# Patient Record
Sex: Female | Born: 1945 | Race: White | Hispanic: No | Smoking: Former smoker
Health system: Southern US, Community
[De-identification: ages and names within clinical notes are randomized; demographics above are authoritative.]

## PROBLEM LIST (undated history)

## (undated) DIAGNOSIS — J189 Pneumonia, unspecified organism: Secondary | ICD-10-CM

## (undated) DIAGNOSIS — K219 Gastro-esophageal reflux disease without esophagitis: Secondary | ICD-10-CM

## (undated) DIAGNOSIS — C642 Malignant neoplasm of left kidney, except renal pelvis: Secondary | ICD-10-CM

## (undated) DIAGNOSIS — R7303 Prediabetes: Secondary | ICD-10-CM

## (undated) DIAGNOSIS — E669 Obesity, unspecified: Secondary | ICD-10-CM

## (undated) DIAGNOSIS — E041 Nontoxic single thyroid nodule: Secondary | ICD-10-CM

## (undated) DIAGNOSIS — S42302A Unspecified fracture of shaft of humerus, left arm, initial encounter for closed fracture: Secondary | ICD-10-CM

## (undated) DIAGNOSIS — J449 Chronic obstructive pulmonary disease, unspecified: Secondary | ICD-10-CM

## (undated) DIAGNOSIS — M1990 Unspecified osteoarthritis, unspecified site: Secondary | ICD-10-CM

## (undated) DIAGNOSIS — E785 Hyperlipidemia, unspecified: Secondary | ICD-10-CM

## (undated) DIAGNOSIS — I10 Essential (primary) hypertension: Secondary | ICD-10-CM

## (undated) HISTORY — PX: BREAST BIOPSY: SHX20

## (undated) HISTORY — PX: PARTIAL NEPHRECTOMY: SHX414

## (undated) HISTORY — DX: Chronic obstructive pulmonary disease, unspecified: J44.9

## (undated) HISTORY — DX: Nontoxic single thyroid nodule: E04.1

## (undated) HISTORY — DX: Gastro-esophageal reflux disease without esophagitis: K21.9

## (undated) HISTORY — PX: UTERINE FIBROID SURGERY: SHX826

## (undated) HISTORY — DX: Unspecified osteoarthritis, unspecified site: M19.90

## (undated) HISTORY — PX: CHOLECYSTECTOMY: SHX55

## (undated) HISTORY — DX: Essential (primary) hypertension: I10

## (undated) HISTORY — DX: Unspecified fracture of shaft of humerus, left arm, initial encounter for closed fracture: S42.302A

## (undated) HISTORY — DX: Hyperlipidemia, unspecified: E78.5

## (undated) HISTORY — PX: GANGLION CYST EXCISION: SHX1691

## (undated) HISTORY — PX: BREAST SURGERY: SHX581

## (undated) HISTORY — PX: CATARACT EXTRACTION: SUR2

---

## 1993-04-06 HISTORY — PX: BILATERAL SALPINGOOPHORECTOMY: SHX1223

## 1993-04-06 HISTORY — PX: ABDOMINAL HYSTERECTOMY: SHX81

## 1993-04-06 HISTORY — PX: TOTAL ABDOMINAL HYSTERECTOMY W/ BILATERAL SALPINGOOPHORECTOMY: SHX83

## 1998-02-13 ENCOUNTER — Inpatient Hospital Stay (HOSPITAL_COMMUNITY): Admission: RE | Admit: 1998-02-13 | Discharge: 1998-02-15 | Payer: Self-pay | Admitting: Gynecology

## 1999-01-10 ENCOUNTER — Other Ambulatory Visit: Admission: RE | Admit: 1999-01-10 | Discharge: 1999-01-10 | Payer: Self-pay | Admitting: Gynecology

## 2000-03-15 ENCOUNTER — Other Ambulatory Visit: Admission: RE | Admit: 2000-03-15 | Discharge: 2000-03-15 | Payer: Self-pay | Admitting: Gynecology

## 2001-04-13 ENCOUNTER — Other Ambulatory Visit: Admission: RE | Admit: 2001-04-13 | Discharge: 2001-04-13 | Payer: Self-pay | Admitting: Gynecology

## 2002-04-27 ENCOUNTER — Other Ambulatory Visit: Admission: RE | Admit: 2002-04-27 | Discharge: 2002-04-27 | Payer: Self-pay | Admitting: Gynecology

## 2003-06-07 ENCOUNTER — Other Ambulatory Visit: Admission: RE | Admit: 2003-06-07 | Discharge: 2003-06-07 | Payer: Self-pay | Admitting: Gynecology

## 2003-06-17 ENCOUNTER — Encounter: Admission: RE | Admit: 2003-06-17 | Discharge: 2003-06-17 | Payer: Self-pay | Admitting: Internal Medicine

## 2004-09-16 ENCOUNTER — Other Ambulatory Visit: Admission: RE | Admit: 2004-09-16 | Discharge: 2004-09-16 | Payer: Self-pay | Admitting: Gynecology

## 2005-03-03 ENCOUNTER — Ambulatory Visit: Payer: Self-pay | Admitting: Internal Medicine

## 2005-03-13 ENCOUNTER — Ambulatory Visit: Payer: Self-pay | Admitting: Internal Medicine

## 2005-04-06 HISTORY — PX: COLONOSCOPY: SHX174

## 2005-06-02 ENCOUNTER — Ambulatory Visit: Payer: Self-pay | Admitting: Internal Medicine

## 2005-12-17 ENCOUNTER — Ambulatory Visit: Payer: Self-pay | Admitting: Family Medicine

## 2005-12-30 ENCOUNTER — Ambulatory Visit: Payer: Self-pay | Admitting: Family Medicine

## 2006-01-27 ENCOUNTER — Ambulatory Visit: Payer: Self-pay | Admitting: Gastroenterology

## 2006-04-06 DIAGNOSIS — I10 Essential (primary) hypertension: Secondary | ICD-10-CM

## 2006-04-06 DIAGNOSIS — J189 Pneumonia, unspecified organism: Secondary | ICD-10-CM

## 2006-04-06 HISTORY — DX: Essential (primary) hypertension: I10

## 2006-04-06 HISTORY — DX: Pneumonia, unspecified organism: J18.9

## 2006-06-02 ENCOUNTER — Ambulatory Visit: Payer: Self-pay | Admitting: Internal Medicine

## 2006-07-08 ENCOUNTER — Emergency Department: Payer: Self-pay

## 2007-04-07 HISTORY — PX: CATARACT EXTRACTION: SUR2

## 2008-02-06 ENCOUNTER — Ambulatory Visit: Payer: Self-pay | Admitting: Family Medicine

## 2008-02-21 ENCOUNTER — Ambulatory Visit: Payer: Self-pay | Admitting: Family Medicine

## 2008-06-05 ENCOUNTER — Ambulatory Visit: Payer: Self-pay | Admitting: Otolaryngology

## 2008-12-11 ENCOUNTER — Ambulatory Visit: Payer: Self-pay | Admitting: Otolaryngology

## 2009-03-05 ENCOUNTER — Ambulatory Visit: Payer: Self-pay | Admitting: Family Medicine

## 2009-03-13 ENCOUNTER — Ambulatory Visit: Payer: Self-pay | Admitting: Gastroenterology

## 2009-03-13 HISTORY — PX: ESOPHAGOGASTRODUODENOSCOPY: SHX1529

## 2009-03-26 ENCOUNTER — Ambulatory Visit: Payer: Self-pay | Admitting: Family Medicine

## 2009-12-10 ENCOUNTER — Ambulatory Visit: Payer: Self-pay | Admitting: Internal Medicine

## 2010-03-13 LAB — HM DEXA SCAN

## 2010-04-17 ENCOUNTER — Ambulatory Visit: Payer: Self-pay | Admitting: Family Medicine

## 2011-03-19 ENCOUNTER — Ambulatory Visit (INDEPENDENT_AMBULATORY_CARE_PROVIDER_SITE_OTHER): Payer: BC Managed Care – PPO | Admitting: Internal Medicine

## 2011-03-19 ENCOUNTER — Encounter: Payer: Self-pay | Admitting: Internal Medicine

## 2011-03-19 ENCOUNTER — Telehealth: Payer: Self-pay | Admitting: Internal Medicine

## 2011-03-19 DIAGNOSIS — Z0000 Encounter for general adult medical examination without abnormal findings: Secondary | ICD-10-CM

## 2011-03-19 DIAGNOSIS — Z23 Encounter for immunization: Secondary | ICD-10-CM

## 2011-03-19 DIAGNOSIS — M810 Age-related osteoporosis without current pathological fracture: Secondary | ICD-10-CM | POA: Insufficient documentation

## 2011-03-19 DIAGNOSIS — J309 Allergic rhinitis, unspecified: Secondary | ICD-10-CM | POA: Insufficient documentation

## 2011-03-19 DIAGNOSIS — K219 Gastro-esophageal reflux disease without esophagitis: Secondary | ICD-10-CM | POA: Insufficient documentation

## 2011-03-19 LAB — LIPID PANEL
Cholesterol: 204 mg/dL — ABNORMAL HIGH (ref 0–200)
Total CHOL/HDL Ratio: 4

## 2011-03-19 LAB — COMPREHENSIVE METABOLIC PANEL
AST: 22 U/L (ref 0–37)
Albumin: 3.9 g/dL (ref 3.5–5.2)
BUN: 17 mg/dL (ref 6–23)
Calcium: 9.3 mg/dL (ref 8.4–10.5)
Chloride: 103 mEq/L (ref 96–112)
Glucose, Bld: 109 mg/dL — ABNORMAL HIGH (ref 70–99)
Potassium: 4 mEq/L (ref 3.5–5.1)
Sodium: 139 mEq/L (ref 135–145)
Total Protein: 7.5 g/dL (ref 6.0–8.3)

## 2011-03-19 LAB — CBC WITH DIFFERENTIAL/PLATELET
Basophils Relative: 0.7 % (ref 0.0–3.0)
Eosinophils Relative: 2.4 % (ref 0.0–5.0)
HCT: 39.5 % (ref 36.0–46.0)
Hemoglobin: 13.7 g/dL (ref 12.0–15.0)
Lymphocytes Relative: 39.2 % (ref 12.0–46.0)
Lymphs Abs: 2.3 10*3/uL (ref 0.7–4.0)
Monocytes Relative: 7 % (ref 3.0–12.0)
Neutro Abs: 3 10*3/uL (ref 1.4–7.7)
RBC: 4.48 Mil/uL (ref 3.87–5.11)
RDW: 13.8 % (ref 11.5–14.6)

## 2011-03-19 MED ORDER — PANTOPRAZOLE SODIUM 40 MG PO TBEC: 40.0000 mg | DELAYED_RELEASE_TABLET | Freq: Every day | ORAL | Status: DC

## 2011-03-19 MED ORDER — CETIRIZINE HCL 10 MG PO TABS: 10.0000 mg | ORAL_TABLET | Freq: Every day | ORAL | Status: DC

## 2011-03-19 NOTE — Progress Notes (Signed)
Subjective:    Patient ID: Amber Perez, female    DOB: 24-Sep-1945, 65 y.o.   MRN: [id number]  HPI 65YO female with history of osteoporosis and thyroid nodules presents to establish care. She denies any complaints today. She reports she has been feeling well. Reports good appetite and normal energy level.  Notes a history of osteoporosis and notes she is due for dexa scan. She reports some improvement in her osteoporosis on Evista. Reports full compliance with her medications. Denies any complications with her medications.  Outpatient Encounter Prescriptions as of 03/19/2011  Medication Sig Dispense Refill  . calcium carbonate 200 MG capsule Take 250 mg by mouth daily.        . cetirizine (ZYRTEC) 10 MG tablet Take 1 tablet (10 mg total) by mouth daily.  30 tablet  11  . cholecalciferol (VITAMIN D) 1000 UNITS tablet Take 4,800 Units by mouth daily.        . fish oil-omega-3 fatty acids 1000 MG capsule Take 1 g by mouth daily.        . pantoprazole (PROTONIX) 40 MG tablet Take 1 tablet (40 mg total) by mouth daily.  30 tablet  11  . raloxifene (EVISTA) 60 MG tablet Take 60 mg by mouth daily.          Review of Systems  Constitutional: Negative for fever, chills, appetite change, fatigue and unexpected weight change.  HENT: Negative for ear pain, congestion, sore throat, trouble swallowing, neck pain, voice change and sinus pressure.   Eyes: Negative for visual disturbance.  Respiratory: Negative for cough, shortness of breath, wheezing and stridor.   Cardiovascular: Negative for chest pain, palpitations and leg swelling.  Gastrointestinal: Negative for nausea, vomiting, abdominal pain, diarrhea, constipation, blood in stool, abdominal distention and anal bleeding.  Genitourinary: Negative for dysuria and flank pain.  Musculoskeletal: Negative for myalgias, arthralgias and gait problem.  Skin: Negative for color change and rash.  Neurological: Negative for dizziness and headaches.    Hematological: Negative for adenopathy. Does not bruise/bleed easily.  Psychiatric/Behavioral: Negative for suicidal ideas, sleep disturbance and dysphoric mood. The patient is not nervous/anxious.    BP 128/86  Pulse 87  Temp(Src) 98.3 F (36.8 C) (Oral)  Ht 4\' 11"  (1.499 m)  Wt 181 lb (82.101 kg)  BMI 36.56 kg/m2  SpO2 95%     Objective:   Physical Exam  Constitutional: She is oriented to person, place, and time. She appears well-developed and well-nourished. No distress.  HENT:  Head: Normocephalic and atraumatic.  Right Ear: External ear normal.  Left Ear: External ear normal.  Nose: Nose normal.  Mouth/Throat: Oropharynx is clear and moist. No oropharyngeal exudate.  Eyes: Conjunctivae are normal. Pupils are equal, round, and reactive to light. Right eye exhibits no discharge. Left eye exhibits no discharge. No scleral icterus.  Neck: Normal range of motion. Neck supple. No tracheal deviation present. No thyromegaly present.  Cardiovascular: Normal rate, regular rhythm, normal heart sounds and intact distal pulses.  Exam reveals no gallop and no friction rub.   No murmur heard. Pulmonary/Chest: Effort normal and breath sounds normal. No respiratory distress. She has no wheezes. She has no rales. She exhibits no tenderness. Right breast exhibits no inverted nipple, no mass, no nipple discharge, no skin change and no tenderness. Left breast exhibits no inverted nipple, no mass, no nipple discharge, no skin change and no tenderness. Breasts are symmetrical.  Musculoskeletal: Normal range of motion. She exhibits no edema and no tenderness.  Lymphadenopathy:    She has no cervical adenopathy.  Neurological: She is alert and oriented to person, place, and time. No cranial nerve deficit. She exhibits normal muscle tone. Coordination normal.  Skin: Skin is warm and dry. No rash noted. She is not diaphoretic. No erythema. No pallor.  Psychiatric: She has a normal mood and affect. Her  behavior is normal. Judgment and thought content normal.          Assessment & Plan:  1. General exam - Exam including breast exam is normal today. PAP deferred as all previous PAPs normal.  Will set up mammogram and Dexa scan. Will check labs today including CBC, CMP, lipids, TSH.  Flu shot today. Follow up in 1 year and prn.

## 2011-03-19 NOTE — Telephone Encounter (Signed)
SHE IS SCHEDULED TO SEE DR BYRNETT AT [facility] ON 1-4  I HAVE FAXED OFFICE NOTES AND THEY NEED THE LAB RESULTS FROM TODAY'S LABS FAXED TO THEM @ [phone]

## 2011-03-20 NOTE — Telephone Encounter (Signed)
Faxed

## 2011-03-26 ENCOUNTER — Encounter: Payer: Self-pay | Admitting: Internal Medicine

## 2011-03-26 ENCOUNTER — Encounter: Payer: Self-pay | Admitting: *Deleted

## 2011-05-01 ENCOUNTER — Ambulatory Visit: Payer: Self-pay | Admitting: General Surgery

## 2011-05-01 LAB — HM COLONOSCOPY

## 2011-05-19 ENCOUNTER — Ambulatory Visit: Payer: Self-pay | Admitting: Internal Medicine

## 2011-05-19 LAB — HM MAMMOGRAPHY: HM Mammogram: NORMAL

## 2011-06-15 ENCOUNTER — Encounter: Payer: Self-pay | Admitting: Internal Medicine

## 2011-07-09 ENCOUNTER — Ambulatory Visit: Payer: Self-pay | Admitting: Internal Medicine

## 2011-07-10 ENCOUNTER — Telehealth: Payer: Self-pay | Admitting: Internal Medicine

## 2011-07-10 NOTE — Telephone Encounter (Signed)
Please help schedule pt for f/u OV to discuss bone density results. It can be in the next month.

## 2011-07-10 NOTE — Telephone Encounter (Signed)
Bone density testing shows osteopenia, or early weakening of the bones.  Would recommend return visit to discuss additional treatment options.

## 2011-07-14 NOTE — Telephone Encounter (Signed)
I have left msg on both phone numbers asking patient to return my call so we can set her up an appointment with Dr. Dan Humphreys.

## 2011-07-16 NOTE — Telephone Encounter (Signed)
Let message on cell phone for  pt to call office

## 2011-07-17 ENCOUNTER — Encounter: Payer: Self-pay | Admitting: Internal Medicine

## 2011-07-20 ENCOUNTER — Encounter: Payer: Self-pay | Admitting: Internal Medicine

## 2011-07-20 NOTE — Telephone Encounter (Signed)
Left message on both phone numbers for pt to call office

## 2011-07-20 NOTE — Telephone Encounter (Signed)
After several attempts to contact patient I have mailed out a letter.

## 2011-07-21 ENCOUNTER — Telehealth: Payer: Self-pay | Admitting: Internal Medicine

## 2011-07-21 NOTE — Telephone Encounter (Signed)
Patient was notified of results. I also advised her to make appt as instructed in previous phone note. She says that she can't come in for appt because she pays out of pocket. She is asking if you could give her options without coming in .Please advise.

## 2011-07-21 NOTE — Telephone Encounter (Signed)
Patient called in stating she got the letter I sent her last week in regards to her calling the office.  Patient wanted to know why she needed to come in for an OV why couldn't she just get the results from the test since she would have to pay out of pocket to come in. I directed the call to [place] [facility]. So she could give the results to the patient.

## 2011-07-22 NOTE — Telephone Encounter (Signed)
We can look into Prolia as another option for her.

## 2011-07-22 NOTE — Telephone Encounter (Signed)
Spoke w/pt - she has tried oral bone density meds and c/o severe GI upset (added to allergy list). She would like to know what the other options are.

## 2011-07-22 NOTE — Telephone Encounter (Signed)
Left message asking patient to call me back

## 2011-07-22 NOTE — Telephone Encounter (Signed)
Would recommend checking Vit D to make sure level adequate. If adequate, then we should start bisphosphonate, such as Boniva and plan to repeat bone density in 2 years.

## 2011-07-22 NOTE — Telephone Encounter (Signed)
Patient notified. She will check into the prolia and will call back if she is interested.

## 2011-10-05 ENCOUNTER — Ambulatory Visit: Payer: Self-pay | Admitting: Emergency Medicine

## 2011-10-28 ENCOUNTER — Telehealth: Payer: Self-pay | Admitting: Internal Medicine

## 2011-10-28 NOTE — Telephone Encounter (Signed)
Patient called in and would like for [place] to find out about prolia shot, she has scheduled an appointment with Dr. Dan Humphreys.  She stated that when she called her insurance company they gave her a [id number] code that they would approve she stated that it was called a re-clast.  Please advise.

## 2011-10-29 NOTE — Telephone Encounter (Signed)
Spoke with patient via telephone and she stated that she checked with her insurance company and they will pay for Reclast but she didn't know about Prolia.  Advised her that I will fill out the form and fax to get information regarding what all her insurance will cover and let her know.

## 2011-11-03 NOTE — Telephone Encounter (Signed)
Patient called back to let us know not to order Prolia until after she sees Dr. Dan Humphreys.

## 2011-11-03 NOTE — Telephone Encounter (Signed)
Spoke with patient via telephone, was notified by [place] that patient will have to pay $170 out of pocket for the Prolia injection.  Patient agrees to pay for the injection and I advised her that we will give her a call once the Prolia has come in.

## 2011-11-03 NOTE — Telephone Encounter (Signed)
Left message on machine and on cell phone voicemail for patient to return call.

## 2011-11-25 ENCOUNTER — Ambulatory Visit (INDEPENDENT_AMBULATORY_CARE_PROVIDER_SITE_OTHER): Payer: BC Managed Care – PPO | Admitting: Internal Medicine

## 2011-11-25 ENCOUNTER — Encounter: Payer: Self-pay | Admitting: Internal Medicine

## 2011-11-25 VITALS — BP 120/80 | HR 60 | Temp 98.2°F | Ht 59.0 in | Wt 178.5 lb

## 2011-11-25 DIAGNOSIS — E041 Nontoxic single thyroid nodule: Secondary | ICD-10-CM

## 2011-11-25 DIAGNOSIS — E785 Hyperlipidemia, unspecified: Secondary | ICD-10-CM | POA: Insufficient documentation

## 2011-11-25 DIAGNOSIS — M8580 Other specified disorders of bone density and structure, unspecified site: Secondary | ICD-10-CM | POA: Insufficient documentation

## 2011-11-25 DIAGNOSIS — M810 Age-related osteoporosis without current pathological fracture: Secondary | ICD-10-CM

## 2011-11-25 NOTE — Assessment & Plan Note (Signed)
History of elevated lipids. Will check lipids with labs today.

## 2011-11-25 NOTE — Progress Notes (Signed)
Subjective:    Patient ID: Amber Perez, female    DOB: March 13, 1946, 66 y.o.   MRN: [id number]  HPI 66 year old female with history of osteoporosis, thyroid nodules, and hyperlipidemia presents for followup. She reports she is generally doing well. She is concerned about her thyroid nodules. She reports that her last thyroid ultrasound was over 2 years ago. She questions if any further intervention needs to be done in regards to these nodules. She would like to repeat ultrasound. She denies any symptoms such as difficulty swallowing, swelling in her neck. Next  In regards to her history of osteoporosis, at her last visit we discussed potentially starting Prolia. Coverage is available for this and she would like to proceed with starting this medication. She notes that in the interim since her last visit, she fell on her outstretched left arm and had a closed fracture.  Outpatient Encounter Prescriptions as of 11/25/2011  Medication Sig Dispense Refill  . calcium carbonate 200 MG capsule Take 250 mg by mouth daily.        . cetirizine (ZYRTEC) 10 MG tablet Take 1 tablet (10 mg total) by mouth daily.  30 tablet  11  . cholecalciferol (VITAMIN D) 1000 UNITS tablet Take 4,800 Units by mouth daily.        . fish oil-omega-3 fatty acids 1000 MG capsule Take 1 g by mouth daily.        . pantoprazole (PROTONIX) 40 MG tablet Take 1 tablet (40 mg total) by mouth daily.  30 tablet  11  . raloxifene (EVISTA) 60 MG tablet Take 60 mg by mouth daily.         BP 120/80  Pulse 60  Temp 98.2 F (36.8 C) (Oral)  Ht 4\' 11"  (1.499 m)  Wt 178 lb 8 oz (80.967 kg)  BMI 36.05 kg/m2  SpO2 99%  Review of Systems  Constitutional: Negative for fever, chills, appetite change, fatigue and unexpected weight change.  HENT: Negative for ear pain, congestion, sore throat, trouble swallowing, neck pain, voice change and sinus pressure.   Eyes: Negative for visual disturbance.  Respiratory: Negative for cough, shortness  of breath, wheezing and stridor.   Cardiovascular: Negative for chest pain, palpitations and leg swelling.  Gastrointestinal: Negative for nausea, vomiting, abdominal pain, diarrhea, constipation, blood in stool, abdominal distention and anal bleeding.  Genitourinary: Negative for dysuria and flank pain.  Musculoskeletal: Negative for myalgias, arthralgias and gait problem.  Skin: Negative for color change and rash.  Neurological: Negative for dizziness and headaches.  Hematological: Negative for adenopathy. Does not bruise/bleed easily.  Psychiatric/Behavioral: Negative for suicidal ideas, disturbed wake/sleep cycle and dysphoric mood. The patient is not nervous/anxious.        Objective:   Physical Exam  Constitutional: She is oriented to person, place, and time. She appears well-developed and well-nourished. No distress.  HENT:  Head: Normocephalic and atraumatic.  Right Ear: External ear normal.  Left Ear: External ear normal.  Nose: Nose normal.  Mouth/Throat: Oropharynx is clear and moist. No oropharyngeal exudate.  Eyes: Conjunctivae are normal. Pupils are equal, round, and reactive to light. Right eye exhibits no discharge. Left eye exhibits no discharge. No scleral icterus.  Neck: Normal range of motion. Neck supple. No tracheal deviation present. No thyromegaly present.  Cardiovascular: Normal rate, regular rhythm, normal heart sounds and intact distal pulses.  Exam reveals no gallop and no friction rub.   No murmur heard. Pulmonary/Chest: Effort normal and breath sounds normal. No respiratory distress. She  has no wheezes. She has no rales. She exhibits no tenderness.  Musculoskeletal: Normal range of motion. She exhibits no edema and no tenderness.  Lymphadenopathy:    She has no cervical adenopathy.  Neurological: She is alert and oriented to person, place, and time. No cranial nerve deficit. She exhibits normal muscle tone. Coordination normal.  Skin: Skin is warm and dry.  No rash noted. She is not diaphoretic. No erythema. No pallor.  Psychiatric: She has a normal mood and affect. Her behavior is normal. Judgment and thought content normal.          Assessment & Plan:

## 2011-11-25 NOTE — Assessment & Plan Note (Signed)
Patient reports history in the past of thyroid nodules seen on ultrasound. She has not had an ultrasound in over 2 years. Will plan to repeat ultrasound for evaluation. We'll also check TSH and T4 with labs.

## 2011-11-25 NOTE — Assessment & Plan Note (Signed)
Patient with history of osteoporosis. Not a good candidate for bisphosphonates because of ongoing issues with acid reflux and esophageal irritation. Coverage available for Prolia. We'll plan to start this medication once available. Will check calcium level with labs today.

## 2011-11-26 LAB — LIPID PANEL
Cholesterol: 205 mg/dL — ABNORMAL HIGH (ref 0–200)
Total CHOL/HDL Ratio: 3
Triglycerides: 101 mg/dL (ref 0.0–149.0)

## 2011-11-26 LAB — COMPREHENSIVE METABOLIC PANEL
Albumin: 4.1 g/dL (ref 3.5–5.2)
BUN: 14 mg/dL (ref 6–23)
CO2: 28 mEq/L (ref 19–32)
Calcium: 9.3 mg/dL (ref 8.4–10.5)
Chloride: 104 mEq/L (ref 96–112)
Glucose, Bld: 95 mg/dL (ref 70–99)
Potassium: 4.1 mEq/L (ref 3.5–5.1)
Sodium: 138 mEq/L (ref 135–145)
Total Protein: 7.4 g/dL (ref 6.0–8.3)

## 2011-11-26 LAB — LDL CHOLESTEROL, DIRECT: Direct LDL: 127.1 mg/dL

## 2011-12-02 ENCOUNTER — Ambulatory Visit: Payer: Self-pay | Admitting: Internal Medicine

## 2011-12-02 ENCOUNTER — Telehealth: Payer: Self-pay | Admitting: Internal Medicine

## 2011-12-02 NOTE — Telephone Encounter (Signed)
Left message on home and cell asking patient to return call.

## 2011-12-02 NOTE — Telephone Encounter (Signed)
Ultrasound of the thyroid showed numerous benign appearing nodules which were smaller in size compared to previous measurements.

## 2011-12-03 NOTE — Telephone Encounter (Signed)
Patient advised as instructed via telephone. 

## 2011-12-10 ENCOUNTER — Encounter: Payer: Self-pay | Admitting: Internal Medicine

## 2011-12-23 ENCOUNTER — Ambulatory Visit (INDEPENDENT_AMBULATORY_CARE_PROVIDER_SITE_OTHER): Payer: BC Managed Care – PPO | Admitting: Internal Medicine

## 2011-12-23 ENCOUNTER — Encounter: Payer: Self-pay | Admitting: Internal Medicine

## 2011-12-23 VITALS — BP 122/80 | HR 89 | Temp 98.5°F | Ht 59.0 in | Wt 177.0 lb

## 2011-12-23 DIAGNOSIS — E041 Nontoxic single thyroid nodule: Secondary | ICD-10-CM

## 2011-12-23 DIAGNOSIS — M810 Age-related osteoporosis without current pathological fracture: Secondary | ICD-10-CM

## 2011-12-23 NOTE — Assessment & Plan Note (Signed)
Will plan to start Prolia next week.

## 2011-12-23 NOTE — Assessment & Plan Note (Signed)
Recent ultrasound of the thyroid shows nodules are diminished in size. Will continue to monitor.

## 2011-12-23 NOTE — Progress Notes (Signed)
Subjective:    Patient ID: Amber Perez, female    DOB: 03/08/1946, 66 y.o.   MRN: [id number]  HPI 66 year old female with history of thyroid nodules and osteopenia presents for followup. She reports she is feeling well. Recent thyroid function was normal. Thyroid ultrasound showed numerous thyroid nodules all of which were diminished in size compared to previous ultrasound in 2011. She has no new concerns today.  Outpatient Encounter Prescriptions as of 12/23/2011  Medication Sig Dispense Refill  . calcium carbonate 200 MG capsule Take 250 mg by mouth daily.        . cetirizine (ZYRTEC) 10 MG tablet Take 1 tablet (10 mg total) by mouth daily.  30 tablet  11  . cholecalciferol (VITAMIN D) 1000 UNITS tablet Take 4,800 Units by mouth daily.        . fish oil-omega-3 fatty acids 1000 MG capsule Take 1 g by mouth daily.        . pantoprazole (PROTONIX) 40 MG tablet Take 1 tablet (40 mg total) by mouth daily.  30 tablet  11  . raloxifene (EVISTA) 60 MG tablet Take 60 mg by mouth daily.         BP 122/80  Pulse 89  Temp 98.5 F (36.9 C) (Oral)  Ht 4\' 11"  (1.499 m)  Wt 177 lb (80.287 kg)  BMI 35.75 kg/m2  SpO2 97%  Review of Systems  Constitutional: Negative for fever, chills, appetite change, fatigue and unexpected weight change.  HENT: Negative for ear pain, congestion, sore throat, trouble swallowing, neck pain, voice change and sinus pressure.   Eyes: Negative for visual disturbance.  Respiratory: Negative for cough, shortness of breath, wheezing and stridor.   Cardiovascular: Negative for chest pain, palpitations and leg swelling.  Gastrointestinal: Negative for nausea, vomiting, abdominal pain, diarrhea, constipation, blood in stool, abdominal distention and anal bleeding.  Genitourinary: Negative for dysuria and flank pain.  Musculoskeletal: Negative for myalgias, arthralgias and gait problem.  Skin: Negative for color change and rash.  Neurological: Negative for dizziness and  headaches.  Hematological: Negative for adenopathy. Does not bruise/bleed easily.  Psychiatric/Behavioral: Negative for suicidal ideas, disturbed wake/sleep cycle and dysphoric mood. The patient is not nervous/anxious.        Objective:   Physical Exam  Constitutional: She is oriented to person, place, and time. She appears well-developed and well-nourished. No distress.  HENT:  Head: Normocephalic and atraumatic.  Right Ear: External ear normal.  Left Ear: External ear normal.  Nose: Nose normal.  Mouth/Throat: Oropharynx is clear and moist. No oropharyngeal exudate.  Eyes: Conjunctivae normal are normal. Pupils are equal, round, and reactive to light. Right eye exhibits no discharge. Left eye exhibits no discharge. No scleral icterus.  Neck: Normal range of motion. Neck supple. No tracheal deviation present. No thyromegaly present.  Cardiovascular: Normal rate, regular rhythm, normal heart sounds and intact distal pulses.  Exam reveals no gallop and no friction rub.   No murmur heard. Pulmonary/Chest: Effort normal and breath sounds normal. No respiratory distress. She has no wheezes. She has no rales. She exhibits no tenderness.  Musculoskeletal: Normal range of motion. She exhibits no edema and no tenderness.  Lymphadenopathy:    She has no cervical adenopathy.  Neurological: She is alert and oriented to person, place, and time. No cranial nerve deficit. She exhibits normal muscle tone. Coordination normal.  Skin: Skin is warm and dry. No rash noted. She is not diaphoretic. No erythema. No pallor.  Psychiatric: She has a  normal mood and affect. Her behavior is normal. Judgment and thought content normal.          Assessment & Plan:

## 2011-12-28 ENCOUNTER — Ambulatory Visit (INDEPENDENT_AMBULATORY_CARE_PROVIDER_SITE_OTHER): Payer: BC Managed Care – PPO | Admitting: Internal Medicine

## 2011-12-28 DIAGNOSIS — M810 Age-related osteoporosis without current pathological fracture: Secondary | ICD-10-CM

## 2011-12-28 MED ORDER — DENOSUMAB 60 MG/ML ~~LOC~~ SOLN
60.0000 mg | Freq: Once | SUBCUTANEOUS | Status: AC
  Administered 2011-12-28: 60 mg via SUBCUTANEOUS

## 2012-01-05 ENCOUNTER — Ambulatory Visit (INDEPENDENT_AMBULATORY_CARE_PROVIDER_SITE_OTHER): Payer: BC Managed Care – PPO

## 2012-01-05 DIAGNOSIS — Z23 Encounter for immunization: Secondary | ICD-10-CM

## 2012-01-20 ENCOUNTER — Telehealth: Payer: Self-pay | Admitting: Internal Medicine

## 2012-01-20 NOTE — Telephone Encounter (Signed)
Pt called left message stated she left  Her xray  when she had her flu shot on 01/05/12. And wanted to know what she needed to do about it.

## 2012-01-27 ENCOUNTER — Telehealth: Payer: Self-pay | Admitting: Internal Medicine

## 2012-01-27 NOTE — Telephone Encounter (Signed)
Caller: [name]/Patient; Patient Name: Amber Perez; PCP: Ronna Polio (Adults only); Best Callback Phone Number: [phone]. Caller states she got flu shot 01/05/12.  . States she left x-rays from her dentist -panorex that showed a nodule in neck from late September 2013.  She has not gotten a response from provider.  Advised caller nothing in EMR to share at this time.  Note to office for follow up per PCP Calls, No Triage protocol.  Caller relates she circled the nodule and that her name was not on the film.

## 2012-01-27 NOTE — Telephone Encounter (Signed)
I have mailed activation code for mychart.

## 2012-01-27 NOTE — Telephone Encounter (Signed)
Please mail activation code for my to patient

## 2012-01-29 NOTE — Telephone Encounter (Signed)
We should set up an appointment to discuss.

## 2012-02-02 NOTE — Telephone Encounter (Signed)
Robin please schedule 30 minute appt.

## 2012-02-04 NOTE — Telephone Encounter (Signed)
Pt canceled appointment.

## 2012-03-08 ENCOUNTER — Ambulatory Visit: Payer: BC Managed Care – PPO | Admitting: Internal Medicine

## 2012-03-24 ENCOUNTER — Ambulatory Visit (INDEPENDENT_AMBULATORY_CARE_PROVIDER_SITE_OTHER): Payer: Medicare Other | Admitting: Internal Medicine

## 2012-03-24 ENCOUNTER — Encounter: Payer: Self-pay | Admitting: Internal Medicine

## 2012-03-24 VITALS — BP 130/88 | HR 83 | Temp 98.0°F | Resp 16 | Ht 59.25 in | Wt 182.0 lb

## 2012-03-24 DIAGNOSIS — M1711 Unilateral primary osteoarthritis, right knee: Secondary | ICD-10-CM | POA: Insufficient documentation

## 2012-03-24 DIAGNOSIS — Z139 Encounter for screening, unspecified: Secondary | ICD-10-CM

## 2012-03-24 DIAGNOSIS — N952 Postmenopausal atrophic vaginitis: Secondary | ICD-10-CM | POA: Insufficient documentation

## 2012-03-24 DIAGNOSIS — K219 Gastro-esophageal reflux disease without esophagitis: Secondary | ICD-10-CM

## 2012-03-24 DIAGNOSIS — Z0000 Encounter for general adult medical examination without abnormal findings: Secondary | ICD-10-CM | POA: Insufficient documentation

## 2012-03-24 DIAGNOSIS — Z23 Encounter for immunization: Secondary | ICD-10-CM

## 2012-03-24 DIAGNOSIS — M1710 Unilateral primary osteoarthritis, unspecified knee: Secondary | ICD-10-CM

## 2012-03-24 MED ORDER — PNEUMOCOCCAL VAC POLYVALENT 25 MCG/0.5ML IJ INJ: 0.5000 mL | INJECTION | Freq: Once | INTRAMUSCULAR | Status: DC

## 2012-03-24 MED ORDER — ZOSTER VACCINE LIVE 19400 UNT/0.65ML ~~LOC~~ SOLR: 0.6500 mL | Freq: Once | SUBCUTANEOUS | Status: DC

## 2012-03-24 MED ORDER — ESTROGENS, CONJUGATED 0.625 MG/GM VA CREA: TOPICAL_CREAM | Freq: Every day | VAGINAL | Status: DC

## 2012-03-24 MED ORDER — MELOXICAM 15 MG PO TABS: 15.0000 mg | ORAL_TABLET | Freq: Every day | ORAL | Status: DC

## 2012-03-24 MED ORDER — TRAMADOL HCL 50 MG PO TABS: 50.0000 mg | ORAL_TABLET | Freq: Three times a day (TID) | ORAL | Status: DC | PRN

## 2012-03-24 MED ORDER — PANTOPRAZOLE SODIUM 40 MG PO TBEC: 40.0000 mg | DELAYED_RELEASE_TABLET | Freq: Every day | ORAL | Status: DC

## 2012-03-24 NOTE — Assessment & Plan Note (Signed)
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 No improvement with last steroid injection. Will add meloxicam daily and tramadol as neededfor severe pain. We discussed setting up a followup with her orthopedic surgeon for further evaluation and possible Synvisc. She will call if symptoms not improving or worsening.

## 2012-03-24 NOTE — Assessment & Plan Note (Signed)
General medical exam normal today including breast exam. Pap and pelvic deferred given the patient is status post complete hysterectomy and per patient preference. Health maintenance is up to date including mammogram and colonoscopy. Labwork is up-to-date. Will recheck CMP to evaluate liver function tests as this was elevated on last check. Appropriate screening performed. Patient is at risk for falls given her ongoing issues with right knee pain. Adjust as below. EKG normal today. Pneumovax given. Followup in one year for wellness visit.

## 2012-03-24 NOTE — Progress Notes (Signed)
Subjective:    Patient ID: Amber Perez, female    DOB: [birth year], 66 y.o.   MRN: [id number]  HPI The patient is here for annual Medicare wellness examination and management of other chronic and acute problems.   The risk factors are reflected in the social history.  The roster of all physicians providing medical care to patient - is listed in the Snapshot section of the chart.  Activities of daily living:  The patient is 100% independent in all ADLs: dressing, toileting, feeding as well as independent mobility  Home safety : The patient has smoke detectors in the home. They wear seatbelts.  There are no firearms at home. There is no violence in the home.   There is no risks for hepatitis, STDs or HIV. There is no history of blood transfusion. They have no travel history to infectious disease endemic areas of the world.  The patient has seen their dentist in the last six month. (Dr. Leonidas Romberg)  They have seen their eye doctor in the last year. (Dr. Clydene Pugh) Eval for hearing aids in the past, found not be helpful. Stable x 15 years. They have deferred audiologic testing in the last year.  They do not have excessive sun exposure. Discussed the need for sun protection: hats, long sleeves and use of sunscreen if there is significant sun exposure. Dermatologist - Dr. Ebony Cargo.  Diet: the importance of a healthy diet is discussed. They do have a healthy diet.  The benefits of regular aerobic exercise were discussed. She walks occasionally, limited by pain in knee.  Depression screen: there are no signs or vegative symptoms of depression- irritability, change in appetite, anhedonia, sadness/tearfullness.  Cognitive assessment: the patient manages all their financial and personal affairs and is actively engaged. They could relate day,date,year and events.  The following portions of the patient's history were reviewed and updated as appropriate: allergies, current medications, past  family history, past medical history,  past surgical history, past social history  and problem list.  Visual acuity was not assessed per patient preference since she has regular follow up with her ophthalmologist. Hearing and body mass index were assessed and reviewed.   During the course of the visit the patient was educated and counseled about appropriate screening and preventive services including : fall prevention , diabetes screening, nutrition counseling, colorectal cancer screening, and recommended immunizations.    Patient is also concerned today about worsening right knee pain. She reports that pain in her right knee became much worse after the trip 2 months ago in which she was walking frequently and had to go up multiple steps. Since that time, she has had aching pain in her right knee. She denies instability in her leg. She occasionally uses a knee brace with no improvement. She has been using over-the-counter Tylenol or Motrin with minimal improvement.  Outpatient Encounter Prescriptions as of 03/24/2012  Medication Sig Dispense Refill  . calcium carbonate 200 MG capsule Take 250 mg by mouth daily.        . cetirizine (ZYRTEC) 10 MG tablet Take 1 tablet (10 mg total) by mouth daily.  30 tablet  11  . cholecalciferol (VITAMIN D) 1000 UNITS tablet Take 4,800 Units by mouth daily.        . fish oil-omega-3 fatty acids 1000 MG capsule Take 1 g by mouth daily.        . pantoprazole (PROTONIX) 40 MG tablet Take 1 tablet (40 mg total) by mouth daily.  30 tablet  11  . raloxifene (EVISTA) 60 MG tablet Take 60 mg by mouth daily.        . [DISCONTINUED] pantoprazole (PROTONIX) 40 MG tablet Take 1 tablet (40 mg total) by mouth daily.  30 tablet  11  . conjugated estrogens (PREMARIN) vaginal cream Place vaginally daily.  42.5 g  12  . meloxicam (MOBIC) 15 MG tablet Take 1 tablet (15 mg total) by mouth daily.  30 tablet  0  . traMADol (ULTRAM) 50 MG tablet Take 1 tablet (50 mg total) by mouth  every 8 (eight) hours as needed for pain.  90 tablet  1  . zoster vaccine live, PF, (ZOSTAVAX) 16109 UNT/0.65ML injection Inject 19,400 Units into the skin once.  1 each  0   Facility-Administered Encounter Medications as of 03/24/2012  Medication Dose Route Frequency Provider Last Rate Last Dose  . pneumococcal 23 valent vaccine (PNU-IMMUNE) injection 0.5 mL  0.5 mL Intramuscular Once Shelia Media, MD       BP 130/88  Pulse 83  Temp 98 F (36.7 C) (Oral)  Resp 16  Ht 4' 11.25" (1.505 m)  Wt 182 lb (82.555 kg)  BMI 36.45 kg/m2  SpO2 96%   Review of Systems  Constitutional: Negative for fever, chills, appetite change, fatigue and unexpected weight change.  HENT: Negative for ear pain, congestion, sore throat, trouble swallowing, neck pain, voice change and sinus pressure.   Eyes: Negative for visual disturbance.  Respiratory: Negative for cough, shortness of breath, wheezing and stridor.   Cardiovascular: Negative for chest pain, palpitations and leg swelling.  Gastrointestinal: Negative for nausea, vomiting, abdominal pain, diarrhea, constipation, blood in stool, abdominal distention and anal bleeding.  Genitourinary: Negative for dysuria and flank pain.  Musculoskeletal: Positive for myalgias, joint swelling and arthralgias. Negative for gait problem.  Skin: Negative for color change and rash.  Neurological: Negative for dizziness and headaches.  Hematological: Negative for adenopathy. Does not bruise/bleed easily.  Psychiatric/Behavioral: Negative for suicidal ideas, sleep disturbance and dysphoric mood. The patient is not nervous/anxious.        Objective:   Physical Exam  Constitutional: She is oriented to person, place, and time. She appears well-developed and well-nourished. No distress.  HENT:  Head: Normocephalic and atraumatic.  Right Ear: External ear normal.  Left Ear: External ear normal.  Nose: Nose normal.  Mouth/Throat: Oropharynx is clear and moist. No  oropharyngeal exudate.  Eyes: Conjunctivae normal are normal. Pupils are equal, round, and reactive to light. Right eye exhibits no discharge. Left eye exhibits no discharge. No scleral icterus.  Neck: Normal range of motion. Neck supple. No tracheal deviation present. No thyromegaly present.  Cardiovascular: Normal rate, regular rhythm, normal heart sounds and intact distal pulses.  Exam reveals no gallop and no friction rub.   No murmur heard. Pulmonary/Chest: Effort normal and breath sounds normal. No accessory muscle usage. Not tachypneic. No respiratory distress. She has no decreased breath sounds. She has no wheezes. She has no rhonchi. She has no rales. She exhibits no tenderness. Right breast exhibits no inverted nipple, no mass, no nipple discharge, no skin change and no tenderness. Left breast exhibits no inverted nipple, no mass, no nipple discharge, no skin change and no tenderness. Breasts are symmetrical.  Abdominal: Soft. Bowel sounds are normal. She exhibits no distension and no mass. There is no tenderness. There is no rebound and no guarding.  Musculoskeletal: She exhibits no edema and no tenderness.       Right knee: She  exhibits decreased range of motion and swelling (crepitus with flexion).  Lymphadenopathy:    She has no cervical adenopathy.  Neurological: She is alert and oriented to person, place, and time. No cranial nerve deficit. She exhibits normal muscle tone. Coordination normal.  Skin: Skin is warm and dry. No rash noted. She is not diaphoretic. No erythema. No pallor.  Psychiatric: She has a normal mood and affect. Her behavior is normal. Judgment and thought content normal.          Assessment & Plan:

## 2012-03-25 LAB — COMPREHENSIVE METABOLIC PANEL
AST: 25 U/L (ref 0–37)
Albumin: 4.1 g/dL (ref 3.5–5.2)
Alkaline Phosphatase: 42 U/L (ref 39–117)
BUN: 15 mg/dL (ref 6–23)
Glucose, Bld: 95 mg/dL (ref 70–99)
Potassium: 3.9 mEq/L (ref 3.5–5.1)
Sodium: 141 mEq/L (ref 135–145)
Total Bilirubin: 0.5 mg/dL (ref 0.3–1.2)
Total Protein: 7.1 g/dL (ref 6.0–8.3)

## 2012-04-06 HISTORY — PX: KNEE ARTHROSCOPY W/ MENISCAL REPAIR: SHX1877

## 2012-04-18 ENCOUNTER — Other Ambulatory Visit: Payer: Self-pay | Admitting: *Deleted

## 2012-04-18 DIAGNOSIS — Z0000 Encounter for general adult medical examination without abnormal findings: Secondary | ICD-10-CM

## 2012-04-18 MED ORDER — ZOSTER VACCINE LIVE 19400 UNT/0.65ML ~~LOC~~ SOLR: 0.6500 mL | Freq: Once | SUBCUTANEOUS | Status: DC

## 2012-04-20 ENCOUNTER — Ambulatory Visit: Payer: Self-pay | Admitting: Orthopedic Surgery

## 2012-04-26 ENCOUNTER — Other Ambulatory Visit: Payer: Self-pay | Admitting: Internal Medicine

## 2012-04-27 ENCOUNTER — Other Ambulatory Visit: Payer: Self-pay | Admitting: Internal Medicine

## 2012-04-27 NOTE — Telephone Encounter (Signed)
Pt called regarding a refill needed for meloxicam - to be sent to [facility] in [place]

## 2012-04-28 NOTE — Telephone Encounter (Signed)
Spoke to pt told her refill for Meloxicam was sent to pharmacy. Pt stated she picked it up yesterday.

## 2012-05-25 ENCOUNTER — Ambulatory Visit: Payer: Self-pay | Admitting: Internal Medicine

## 2012-06-01 ENCOUNTER — Ambulatory Visit: Payer: Self-pay | Admitting: General Practice

## 2012-06-07 ENCOUNTER — Encounter: Payer: Self-pay | Admitting: Internal Medicine

## 2012-06-08 ENCOUNTER — Other Ambulatory Visit: Payer: Self-pay | Admitting: Internal Medicine

## 2012-06-09 ENCOUNTER — Other Ambulatory Visit: Payer: Self-pay | Admitting: *Deleted

## 2012-06-09 DIAGNOSIS — K219 Gastro-esophageal reflux disease without esophagitis: Secondary | ICD-10-CM

## 2012-06-09 MED ORDER — PANTOPRAZOLE SODIUM 40 MG PO TBEC: 40.0000 mg | DELAYED_RELEASE_TABLET | Freq: Every day | ORAL | Status: DC

## 2012-06-09 NOTE — Telephone Encounter (Signed)
Patient called back stating medication should have been sent in as a 90 day supply instead of 30 day. Refill resent to pharmacy on file.

## 2012-10-14 ENCOUNTER — Telehealth: Payer: Self-pay | Admitting: Physician Assistant

## 2012-10-20 ENCOUNTER — Encounter: Payer: Self-pay | Admitting: *Deleted

## 2012-11-03 ENCOUNTER — Encounter: Payer: Self-pay | Admitting: Internal Medicine

## 2013-02-09 ENCOUNTER — Ambulatory Visit: Payer: Self-pay | Admitting: General Practice

## 2013-02-09 ENCOUNTER — Other Ambulatory Visit: Payer: Self-pay

## 2013-02-20 LAB — HM MAMMOGRAPHY: HM MAMMO: NORMAL

## 2013-03-06 ENCOUNTER — Encounter: Payer: Self-pay | Admitting: Internal Medicine

## 2013-03-06 DIAGNOSIS — N952 Postmenopausal atrophic vaginitis: Secondary | ICD-10-CM

## 2013-03-06 MED ORDER — PANTOPRAZOLE SODIUM 40 MG PO TBEC: DELAYED_RELEASE_TABLET | ORAL | Status: DC

## 2013-03-06 MED ORDER — ESTROGENS, CONJUGATED 0.625 MG/GM VA CREA: TOPICAL_CREAM | Freq: Every day | VAGINAL | Status: DC

## 2013-05-29 ENCOUNTER — Encounter: Payer: Self-pay | Admitting: *Deleted

## 2013-06-06 ENCOUNTER — Ambulatory Visit: Payer: Self-pay | Admitting: Internal Medicine

## 2013-06-06 LAB — HM MAMMOGRAPHY: HM MAMMO: NORMAL

## 2013-06-20 ENCOUNTER — Telehealth: Payer: Self-pay | Admitting: Internal Medicine

## 2013-06-20 ENCOUNTER — Ambulatory Visit (INDEPENDENT_AMBULATORY_CARE_PROVIDER_SITE_OTHER): Payer: Commercial Managed Care - HMO | Admitting: Internal Medicine

## 2013-06-20 ENCOUNTER — Encounter: Payer: Self-pay | Admitting: Internal Medicine

## 2013-06-20 VITALS — BP 140/88 | HR 82 | Temp 97.7°F | Ht 59.5 in | Wt 183.0 lb

## 2013-06-20 DIAGNOSIS — IMO0002 Reserved for concepts with insufficient information to code with codable children: Secondary | ICD-10-CM

## 2013-06-20 DIAGNOSIS — Z0000 Encounter for general adult medical examination without abnormal findings: Secondary | ICD-10-CM

## 2013-06-20 DIAGNOSIS — M1711 Unilateral primary osteoarthritis, right knee: Secondary | ICD-10-CM

## 2013-06-20 DIAGNOSIS — K219 Gastro-esophageal reflux disease without esophagitis: Secondary | ICD-10-CM

## 2013-06-20 DIAGNOSIS — Z833 Family history of diabetes mellitus: Secondary | ICD-10-CM

## 2013-06-20 DIAGNOSIS — M1710 Unilateral primary osteoarthritis, unspecified knee: Secondary | ICD-10-CM

## 2013-06-20 DIAGNOSIS — N952 Postmenopausal atrophic vaginitis: Secondary | ICD-10-CM

## 2013-06-20 LAB — CBC WITH DIFFERENTIAL/PLATELET
Basophils Absolute: 0.1 10*3/uL (ref 0.0–0.1)
Basophils Relative: 0.9 % (ref 0.0–3.0)
EOS PCT: 2.3 % (ref 0.0–5.0)
Eosinophils Absolute: 0.1 10*3/uL (ref 0.0–0.7)
HCT: 39.8 % (ref 36.0–46.0)
Hemoglobin: 13.3 g/dL (ref 12.0–15.0)
LYMPHS PCT: 39.2 % (ref 12.0–46.0)
Lymphs Abs: 2.4 10*3/uL (ref 0.7–4.0)
MCHC: 33.4 g/dL (ref 30.0–36.0)
MCV: 88.8 fl (ref 78.0–100.0)
Monocytes Absolute: 0.5 10*3/uL (ref 0.1–1.0)
Monocytes Relative: 8.2 % (ref 3.0–12.0)
NEUTROS PCT: 49.4 % (ref 43.0–77.0)
Neutro Abs: 3 10*3/uL (ref 1.4–7.7)
PLATELETS: 242 10*3/uL (ref 150.0–400.0)
RBC: 4.48 Mil/uL (ref 3.87–5.11)
RDW: 14.8 % — ABNORMAL HIGH (ref 11.5–14.6)
WBC: 6 10*3/uL (ref 4.5–10.5)

## 2013-06-20 LAB — MICROALBUMIN / CREATININE URINE RATIO
Creatinine,U: 57.7 mg/dL
Microalb Creat Ratio: 0.7 mg/g (ref 0.0–30.0)
Microalb, Ur: 0.4 mg/dL (ref 0.0–1.9)

## 2013-06-20 LAB — COMPREHENSIVE METABOLIC PANEL
ALBUMIN: 4.4 g/dL (ref 3.5–5.2)
ALK PHOS: 48 U/L (ref 39–117)
ALT: 41 U/L — ABNORMAL HIGH (ref 0–35)
AST: 32 U/L (ref 0–37)
BUN: 11 mg/dL (ref 6–23)
CALCIUM: 9.3 mg/dL (ref 8.4–10.5)
CHLORIDE: 103 meq/L (ref 96–112)
CO2: 26 mEq/L (ref 19–32)
Creatinine, Ser: 0.7 mg/dL (ref 0.4–1.2)
GFR: 83.38 mL/min (ref >60.00)
GLUCOSE: 102 mg/dL — AB (ref 70–99)
POTASSIUM: 3.9 meq/L (ref 3.5–5.1)
Sodium: 137 mEq/L (ref 135–145)
Total Bilirubin: 0.7 mg/dL (ref 0.3–1.2)
Total Protein: 7.6 g/dL (ref 6.0–8.3)

## 2013-06-20 LAB — LIPID PANEL
CHOLESTEROL: 218 mg/dL — AB (ref 0–200)
HDL: 61.9 mg/dL (ref >39.00)
LDL CALC: 127 mg/dL — AB (ref 0–99)
Total CHOL/HDL Ratio: 4
Triglycerides: 147 mg/dL (ref 0.0–149.0)
VLDL: 29.4 mg/dL (ref 0.0–40.0)

## 2013-06-20 LAB — HEMOGLOBIN A1C: Hgb A1c MFr Bld: 6.2 % (ref 4.6–6.5)

## 2013-06-20 LAB — TSH: TSH: 0.74 u[IU]/mL (ref 0.35–5.50)

## 2013-06-20 MED ORDER — ESTROGENS, CONJUGATED 0.625 MG/GM VA CREA: TOPICAL_CREAM | Freq: Every day | VAGINAL | Status: DC

## 2013-06-20 MED ORDER — PANTOPRAZOLE SODIUM 40 MG PO TBEC: 40.0000 mg | DELAYED_RELEASE_TABLET | Freq: Every day | ORAL | Status: DC

## 2013-06-20 MED ORDER — TRAMADOL HCL 50 MG PO TABS: 100.0000 mg | ORAL_TABLET | Freq: Three times a day (TID) | ORAL | Status: DC | PRN

## 2013-06-20 NOTE — Assessment & Plan Note (Signed)
Symptoms well controlled on Pantoprazole. Will continue. 

## 2013-06-20 NOTE — Assessment & Plan Note (Signed)
General medical exam including breast exam normal today. Pap and pelvic deferred given age and status post hysterectomy as well as patient preference. Mammogram up-to-date, will request report. Colonoscopy up-to-date. Encouraged healthy diet and regular physical activity.

## 2013-06-20 NOTE — Telephone Encounter (Signed)
Pt would like you to mail her lab results to her

## 2013-06-20 NOTE — Assessment & Plan Note (Signed)
Continue topical Premarin.

## 2013-06-20 NOTE — Progress Notes (Signed)
Subjective:    Patient ID: Amber Perez, female    DOB: 1946-02-16, 68 y.o.   MRN: [id number]  HPI The patient is here for annual Medicare wellness examination and management of other chronic and acute problems.   The risk factors are reflected in the social history.  The roster of all physicians providing medical care to patient - is listed in the Snapshot section of the chart.  Activities of daily living:  The patient is 100% independent in all ADLs: dressing, toileting, feeding as well as independent mobility. Lives alone. No pets. 1 story home. Mostly hard wood floors.  Home safety : The patient has smoke detectors in the home. They wear seatbelts.  There are no firearms at home. There is no violence in the home.   There is no risks for hepatitis, STDs or HIV. There is no history of blood transfusion. They have no travel history to infectious disease endemic areas of the world.  The patient has seen their dentist in the last six month. (Dr. Rosalita Levan)  They have seen their eye doctor in the last year. (Dr. Ellin Mayhew) Eval for hearing aids in the past, found not be helpful. Stable x 15 years. They have deferred audiologic testing in the last year.  They do not have excessive sun exposure. Discussed the need for sun protection: hats, long sleeves and use of sunscreen if there is significant sun exposure. Dermatologist - Dr. Aubery Lapping.  Diet: the importance of a healthy diet is discussed. They do have a healthy diet.  The benefits of regular aerobic exercise were discussed. Walking continues to be limited because of knee pain.  Depression screen: there are no signs or vegative symptoms of depression- irritability, change in appetite, anhedonia, sadness/tearfullness.  Cognitive assessment: the patient manages all their financial and personal affairs and is actively engaged. They could relate day,date,year and events.  The following portions of the patient's history were  reviewed and updated as appropriate: allergies, current medications, past family history, past medical history,  past surgical history, past social history  and problem list.  Visual acuity was not assessed per patient preference since she has regular follow up with her ophthalmologist. Hearing and body mass index were assessed and reviewed.   During the course of the visit the patient was educated and counseled about appropriate screening and preventive services including : fall prevention , diabetes screening, nutrition counseling, colorectal cancer screening, and recommended immunizations.    Recently had meniscal repair of her right knee.Occasionally has some pain in her knee, especially with increased physical activity. Has been able to walk for exercise though. Would like to refill Tramadol to use prn.  Outpatient Encounter Prescriptions as of 06/20/2013  Medication Sig  . calcium carbonate 200 MG capsule Take 250 mg by mouth daily.    . cetirizine (ZYRTEC) 10 MG tablet Take 1 tablet (10 mg total) by mouth daily.  . cholecalciferol (VITAMIN D) 1000 UNITS tablet Take 4,800 Units by mouth daily.    [name] conjugated estrogens (PREMARIN) vaginal cream Place vaginally daily.  . fish oil-omega-3 fatty acids 1000 MG capsule Take 1 g by mouth daily.    . pantoprazole (PROTONIX) 40 MG tablet Take 1 tablet (40 mg total) by mouth daily.  . meloxicam (MOBIC) 15 MG tablet TAKE 1 TABLET BY MOUTH EVERY DAY  . raloxifene (EVISTA) 60 MG tablet Take 60 mg by mouth daily.   . traMADol (ULTRAM) 50 MG tablet Take 1 tablet (50 mg total) by mouth  every 8 (eight) hours as needed for pain.     Review of Systems  Constitutional: Negative for fever, chills, appetite change, fatigue and unexpected weight change.  HENT: Negative for congestion, ear pain, sinus pressure, sore throat, trouble swallowing and voice change.   Eyes: Negative for visual disturbance.  Respiratory: Negative for cough, shortness of breath,  wheezing and stridor.   Cardiovascular: Negative for chest pain, palpitations and leg swelling.  Gastrointestinal: Negative for nausea, vomiting, abdominal pain, diarrhea, constipation, blood in stool, abdominal distention and anal bleeding.  Genitourinary: Negative for dysuria and flank pain.  Musculoskeletal: Negative for arthralgias, gait problem, myalgias and neck pain.  Skin: Negative for color change and rash.  Neurological: Negative for dizziness and headaches.  Hematological: Negative for adenopathy. Does not bruise/bleed easily.  Psychiatric/Behavioral: Negative for suicidal ideas, sleep disturbance and dysphoric mood. The patient is not nervous/anxious.        Objective:    BP 140/88  Pulse 82  Temp(Src) 97.7 F (36.5 C) (Oral)  Ht 4' 11.5" (1.511 m)  Wt 183 lb (83.008 kg)  BMI 36.36 kg/m2  SpO2 96% Physical Exam  Constitutional: She is oriented to person, place, and time. She appears well-developed and well-nourished. No distress.  HENT:  Head: Normocephalic and atraumatic.  Right Ear: External ear normal.  Left Ear: External ear normal.  Nose: Nose normal.  Mouth/Throat: Oropharynx is clear and moist. No oropharyngeal exudate.  Eyes: Conjunctivae are normal. Pupils are equal, round, and reactive to light. Right eye exhibits no discharge. Left eye exhibits no discharge. No scleral icterus.  Neck: Normal range of motion. Neck supple. No tracheal deviation present. No thyromegaly present.  Cardiovascular: Normal rate, regular rhythm, normal heart sounds and intact distal pulses.  Exam reveals no gallop and no friction rub.   No murmur heard. Pulmonary/Chest: Effort normal and breath sounds normal. No accessory muscle usage. Not tachypneic. No respiratory distress. She has no decreased breath sounds. She has no wheezes. She has no rales. She exhibits no tenderness. Right breast exhibits no inverted nipple, no mass, no nipple discharge, no skin change and no tenderness. Left  breast exhibits no inverted nipple, no mass, no nipple discharge, no skin change and no tenderness. Breasts are symmetrical.  Abdominal: Soft. Bowel sounds are normal. She exhibits no distension and no mass. There is no tenderness. There is no rebound and no guarding.  Musculoskeletal: Normal range of motion. She exhibits no edema and no tenderness.  Lymphadenopathy:    She has no cervical adenopathy.  Neurological: She is alert and oriented to person, place, and time. No cranial nerve deficit. She exhibits normal muscle tone. Coordination normal.  Skin: Skin is warm and dry. No rash noted. She is not diaphoretic. No erythema. No pallor.  Psychiatric: She has a normal mood and affect. Her behavior is normal. Judgment and thought content normal.          Assessment & Plan:   Problem List Items Addressed This Visit   Atrophic vaginitis     Continue topical Premarin.    Relevant Medications      conjugated estrogens (PREMARIN) vaginal cream   Family history of diabetes mellitus   Relevant Orders      HgB A1c   GERD (gastroesophageal reflux disease)     Symptoms well controlled on Pantoprazole. Will continue.    Relevant Medications      pantoprazole (PROTONIX) EC tablet   Other Relevant Orders      CBC with Differential  Medicare annual wellness visit, subsequent - Primary     General medical exam including breast exam normal today. Pap and pelvic deferred given age and status post hysterectomy as well as patient preference. Mammogram up-to-date, will request report. Colonoscopy up-to-date. Encouraged healthy diet and regular physical activity.    Relevant Orders      Comprehensive metabolic panel      Lipid panel      Microalbumin / creatinine urine ratio      Vit D  25 hydroxy (rtn osteoporosis monitoring)      TSH   Osteoarthritis of right knee     Symptoms well controlled with tramadol. Continue tramadol as needed.     Relevant Medications      traMADol (ULTRAM) 50 MG  tablet       Return in about 6 months (around 12/21/2013).

## 2013-06-20 NOTE — Assessment & Plan Note (Signed)
Symptoms well controlled with tramadol. Continue tramadol as needed.

## 2013-06-20 NOTE — Progress Notes (Signed)
Pre visit review using our clinic review tool, if applicable. No additional management support is needed unless otherwise documented below in the visit note. 

## 2013-06-21 LAB — VITAMIN D 25 HYDROXY (VIT D DEFICIENCY, FRACTURES): Vit D, 25-Hydroxy: 70 ng/mL (ref 30–89)

## 2013-06-21 NOTE — Telephone Encounter (Signed)
Labs were mailed to home address on file.

## 2013-06-28 ENCOUNTER — Telehealth: Payer: Self-pay | Admitting: Internal Medicine

## 2013-06-28 NOTE — Telephone Encounter (Signed)
See below phone note  Does pt need appointment      Appointment Request From: Judithe Modest      With Provider: Rica Mast, MD [-Primary Care Physician-]      Preferred Date Range: Any date 06/22/2013 or later      Preferred Times: Any      Reason: To address the following health maintenance concerns.   Zostavax      Comments:   I had a shingles shot last year

## 2013-06-29 NOTE — Telephone Encounter (Signed)
She does not need to have it again if she just had it last year?

## 2013-06-29 NOTE — Telephone Encounter (Signed)
She does not need a shingles vaccine if she had one last year. At present, the recommendation if for shingles shot 1x.

## 2013-06-30 ENCOUNTER — Encounter: Payer: Self-pay | Admitting: Internal Medicine

## 2013-06-30 NOTE — Telephone Encounter (Signed)
Sent mychart message

## 2013-10-17 ENCOUNTER — Other Ambulatory Visit: Payer: Self-pay | Admitting: *Deleted

## 2013-10-17 ENCOUNTER — Telehealth: Payer: Self-pay | Admitting: Internal Medicine

## 2013-10-17 DIAGNOSIS — M1711 Unilateral primary osteoarthritis, right knee: Secondary | ICD-10-CM

## 2013-10-17 MED ORDER — TRAMADOL HCL 50 MG PO TABS: 100.0000 mg | ORAL_TABLET | Freq: Three times a day (TID) | ORAL | Status: DC | PRN

## 2013-10-17 NOTE — Telephone Encounter (Signed)
Fine to refill 

## 2013-10-17 NOTE — Telephone Encounter (Signed)
Last refill and OV 3.17.15.  Please advise refill.

## 2013-10-17 NOTE — Telephone Encounter (Signed)
traMADol (ULTRAM) 50 MG tablet ° °

## 2013-12-21 ENCOUNTER — Encounter: Payer: Self-pay | Admitting: Internal Medicine

## 2013-12-21 ENCOUNTER — Ambulatory Visit (INDEPENDENT_AMBULATORY_CARE_PROVIDER_SITE_OTHER): Payer: Commercial Managed Care - HMO | Admitting: Internal Medicine

## 2013-12-21 VITALS — BP 168/92 | HR 69 | Temp 98.0°F | Ht 59.5 in | Wt 180.5 lb

## 2013-12-21 DIAGNOSIS — R7989 Other specified abnormal findings of blood chemistry: Secondary | ICD-10-CM

## 2013-12-21 DIAGNOSIS — IMO0001 Reserved for inherently not codable concepts without codable children: Secondary | ICD-10-CM | POA: Insufficient documentation

## 2013-12-21 DIAGNOSIS — R030 Elevated blood-pressure reading, without diagnosis of hypertension: Secondary | ICD-10-CM

## 2013-12-21 DIAGNOSIS — M1710 Unilateral primary osteoarthritis, unspecified knee: Secondary | ICD-10-CM

## 2013-12-21 DIAGNOSIS — R945 Abnormal results of liver function studies: Secondary | ICD-10-CM

## 2013-12-21 DIAGNOSIS — Z23 Encounter for immunization: Secondary | ICD-10-CM

## 2013-12-21 DIAGNOSIS — M1711 Unilateral primary osteoarthritis, right knee: Secondary | ICD-10-CM

## 2013-12-21 MED ORDER — TRAMADOL HCL 50 MG PO TABS: 100.0000 mg | ORAL_TABLET | Freq: Three times a day (TID) | ORAL | Status: DC | PRN

## 2013-12-21 NOTE — Progress Notes (Signed)
Pre visit review using our clinic review tool, if applicable. No additional management support is needed unless otherwise documented below in the visit note. 

## 2013-12-21 NOTE — Patient Instructions (Signed)
Check blood pressure 1-2 times daily and email with readings.  Follow up 4 weeks to recheck blood pressure.

## 2013-12-21 NOTE — Assessment & Plan Note (Signed)
Will recheck lipids with labs today. 

## 2013-12-21 NOTE — Assessment & Plan Note (Signed)
BP Readings from Last 3 Encounters:  12/21/13 168/92  06/20/13 140/88  03/24/12 130/88   BP persistently elevated. Renal function with labs today. Will have pt monitor BP at home and email with readings. Follow up in 2-4 weeks. If no improvement, plan add Amlodipine.

## 2013-12-21 NOTE — Assessment & Plan Note (Signed)
Pain well controlled with use of Tramadol. Will continue.

## 2013-12-21 NOTE — Progress Notes (Signed)
Subjective:    Patient ID: Amber Perez, female    DOB: 1945/09/02, 68 y.o.   MRN: [id number]  HPI 68YO female presents for follow up.  Generally feeling well. Trying to lose weight by cutting portion size. Walking some. No supplements. Does not generally check BP at home, but no headache, palpitations, chest pain.  Review of Systems  Constitutional: Negative for fever, chills, appetite change, fatigue and unexpected weight change.  Eyes: Negative for visual disturbance.  Respiratory: Negative for shortness of breath.   Cardiovascular: Negative for chest pain, palpitations and leg swelling.  Gastrointestinal: Negative for nausea, vomiting, abdominal pain, diarrhea and constipation.  Musculoskeletal: Positive for arthralgias and myalgias.  Skin: Negative for color change and rash.  Neurological: Positive for headaches.  Hematological: Negative for adenopathy. Does not bruise/bleed easily.  Psychiatric/Behavioral: Negative for dysphoric mood. The patient is not nervous/anxious.        Objective:    BP 168/92  Pulse 69  Temp(Src) 98 F (36.7 C) (Oral)  Ht 4' 11.5" (1.511 m)  Wt 180 lb 8 oz (81.874 kg)  BMI 35.86 kg/m2  SpO2 95% Physical Exam  Constitutional: She is oriented to person, place, and time. She appears well-developed and well-nourished. No distress.  HENT:  Head: Normocephalic and atraumatic.  Right Ear: External ear normal.  Left Ear: External ear normal.  Nose: Nose normal.  Mouth/Throat: Oropharynx is clear and moist. No oropharyngeal exudate.  Eyes: Conjunctivae are normal. Pupils are equal, round, and reactive to light. Right eye exhibits no discharge. Left eye exhibits no discharge. No scleral icterus.  Neck: Normal range of motion. Neck supple. No tracheal deviation present. No thyromegaly present.  Cardiovascular: Normal rate, regular rhythm, normal heart sounds and intact distal pulses.  Exam reveals no gallop and no friction rub.   No murmur  heard. Pulmonary/Chest: Effort normal and breath sounds normal. No accessory muscle usage. Not tachypneic. No respiratory distress. She has no decreased breath sounds. She has no wheezes. She has no rhonchi. She has no rales. She exhibits no tenderness.  Musculoskeletal: Normal range of motion. She exhibits no edema and no tenderness.  Lymphadenopathy:    She has no cervical adenopathy.  Neurological: She is alert and oriented to person, place, and time. No cranial nerve deficit. She exhibits normal muscle tone. Coordination normal.  Skin: Skin is warm and dry. No rash noted. She is not diaphoretic. No erythema. No pallor.  Psychiatric: She has a normal mood and affect. Her behavior is normal. Judgment and thought content normal.          Assessment & Plan:   Problem List Items Addressed This Visit     Unprioritized   Elevated BP - Primary      BP Readings from Last 3 Encounters:  12/21/13 168/92  06/20/13 140/88  03/24/12 130/88   BP persistently elevated. Renal function with labs today. Will have pt monitor BP at home and email with readings. Follow up in 2-4 weeks. If no improvement, plan add Amlodipine.    Relevant Orders      Comprehensive metabolic panel      Lipid panel   Elevated LFTs     Will recheck lipids with labs today.    Osteoarthritis of right knee     Pain well controlled with use of Tramadol. Will continue.    Relevant Medications      traMADol (ULTRAM) 50 MG tablet       Return in about 4 weeks (around  01/18/2014) for Recheck of Blood Pressure.

## 2013-12-22 LAB — COMPREHENSIVE METABOLIC PANEL
ALT: 32 U/L (ref 0–35)
AST: 23 U/L (ref 0–37)
Albumin: 4.3 g/dL (ref 3.5–5.2)
Alkaline Phosphatase: 51 U/L (ref 39–117)
BUN: 12 mg/dL (ref 6–23)
CHLORIDE: 106 meq/L (ref 96–112)
CO2: 27 mEq/L (ref 19–32)
CREATININE: 0.7 mg/dL (ref 0.4–1.2)
Calcium: 9.6 mg/dL (ref 8.4–10.5)
GFR: 85.92 mL/min (ref >60.00)
Glucose, Bld: 99 mg/dL (ref 70–99)
Potassium: 4.5 mEq/L (ref 3.5–5.1)
SODIUM: 140 meq/L (ref 135–145)
TOTAL PROTEIN: 7.8 g/dL (ref 6.0–8.3)
Total Bilirubin: 0.6 mg/dL (ref 0.2–1.2)

## 2013-12-22 LAB — LIPID PANEL
CHOL/HDL RATIO: 4
Cholesterol: 220 mg/dL — ABNORMAL HIGH (ref 0–200)
HDL: 49.1 mg/dL (ref >39.00)
LDL Cholesterol: 148 mg/dL — ABNORMAL HIGH (ref 0–99)
NONHDL: 170.9
Triglycerides: 113 mg/dL (ref 0.0–149.0)
VLDL: 22.6 mg/dL (ref 0.0–40.0)

## 2014-01-25 ENCOUNTER — Encounter: Payer: Self-pay | Admitting: Internal Medicine

## 2014-01-25 ENCOUNTER — Ambulatory Visit (INDEPENDENT_AMBULATORY_CARE_PROVIDER_SITE_OTHER): Payer: Commercial Managed Care - HMO | Admitting: Internal Medicine

## 2014-01-25 VITALS — BP 160/92 | HR 80 | Temp 97.9°F | Ht 59.5 in | Wt 180.0 lb

## 2014-01-25 DIAGNOSIS — E669 Obesity, unspecified: Secondary | ICD-10-CM

## 2014-01-25 DIAGNOSIS — I10 Essential (primary) hypertension: Secondary | ICD-10-CM | POA: Insufficient documentation

## 2014-01-25 MED ORDER — HYDROCHLOROTHIAZIDE 12.5 MG PO CAPS: 12.5000 mg | ORAL_CAPSULE | Freq: Every day | ORAL | Status: DC | PRN

## 2014-01-25 MED ORDER — AMLODIPINE BESYLATE 5 MG PO TABS: 5.0000 mg | ORAL_TABLET | Freq: Every day | ORAL | Status: DC

## 2014-01-25 NOTE — Progress Notes (Signed)
Pre visit review using our clinic review tool, if applicable. No additional management support is needed unless otherwise documented below in the visit note. 

## 2014-01-25 NOTE — Progress Notes (Signed)
   Subjective:    Patient ID: Amber Perez, female    DOB: 01-22-1946, 68 y.o.   MRN: [id number]  HPI 68YO female presents to follow up hypertension.  BP - Has been running near 140/90s mostly. No chest pain, palpitations, headache. Notes some increased swelling in her legs on occasion. Mostly after eating salty foods.  Obesity - working on losing weight. Trying to increase physical activity and limit caloric intake.   Review of Systems  Constitutional: Negative for fever, chills, appetite change, fatigue and unexpected weight change.  Eyes: Negative for visual disturbance.  Respiratory: Negative for shortness of breath.   Cardiovascular: Positive for leg swelling. Negative for chest pain and palpitations.  Gastrointestinal: Negative for abdominal pain.  Skin: Negative for color change and rash.  Hematological: Negative for adenopathy. Does not bruise/bleed easily.  Psychiatric/Behavioral: Negative for dysphoric mood. The patient is not nervous/anxious.        Objective:    BP 160/92  Pulse 80  Temp(Src) 97.9 F (36.6 C) (Oral)  Ht 4' 11.5" (1.511 m)  Wt 180 lb (81.647 kg)  BMI 35.76 kg/m2  SpO2 97% Physical Exam  Constitutional: She is oriented to person, place, and time. She appears well-developed and well-nourished. No distress.  HENT:  Head: Normocephalic and atraumatic.  Right Ear: External ear normal.  Left Ear: External ear normal.  Nose: Nose normal.  Mouth/Throat: Oropharynx is clear and moist. No oropharyngeal exudate.  Eyes: Conjunctivae are normal. Pupils are equal, round, and reactive to light. Right eye exhibits no discharge. Left eye exhibits no discharge. No scleral icterus.  Neck: Normal range of motion. Neck supple. No tracheal deviation present. No thyromegaly present.  Cardiovascular: Normal rate, regular rhythm, normal heart sounds and intact distal pulses.  Exam reveals no gallop and no friction rub.   No murmur heard. Pulmonary/Chest: Effort  normal and breath sounds normal. No accessory muscle usage. Not tachypneic. No respiratory distress. She has no decreased breath sounds. She has no wheezes. She has no rhonchi. She has no rales. She exhibits no tenderness.  Musculoskeletal: Normal range of motion. She exhibits no edema and no tenderness.  Lymphadenopathy:    She has no cervical adenopathy.  Neurological: She is alert and oriented to person, place, and time. No cranial nerve deficit. She exhibits normal muscle tone. Coordination normal.  Skin: Skin is warm and dry. No rash noted. She is not diaphoretic. No erythema. No pallor.  Psychiatric: She has a normal mood and affect. Her behavior is normal. Judgment and thought content normal.          Assessment & Plan:   Problem List Items Addressed This Visit     Unprioritized   Essential hypertension - Primary      BP Readings from Last 3 Encounters:  01/25/14 160/92  12/21/13 168/92  06/20/13 140/88   BP continues to be elevated. Will start Amlodipine 5mg  daily and prn HCTZ. Follow up recheck BP in 4 weeks.    Relevant Medications      amLODIpine (NORVASC) tablet      hydrochlorothiazide (MICROZIDE) 12.5 MG capsule   Obesity (BMI 30-39.9)      Wt Readings from Last 3 Encounters:  01/25/14 180 lb (81.647 kg)  12/21/13 180 lb 8 oz (81.874 kg)  06/20/13 183 lb (83.008 kg)   Encouraged continued effort at healthy diet and exercise.          Return in about 4 weeks (around 02/22/2014) for Recheck.

## 2014-01-25 NOTE — Assessment & Plan Note (Addendum)
BP Readings from Last 3 Encounters:  01/25/14 160/92  12/21/13 168/92  06/20/13 140/88   BP continues to be elevated. Will start Amlodipine 5mg  daily and prn HCTZ. Follow up recheck BP in 4 weeks.

## 2014-01-25 NOTE — Assessment & Plan Note (Addendum)
Wt Readings from Last 3 Encounters:  01/25/14 180 lb (81.647 kg)  12/21/13 180 lb 8 oz (81.874 kg)  06/20/13 183 lb (83.008 kg)   Encouraged continued effort at healthy diet and exercise.

## 2014-01-25 NOTE — Patient Instructions (Signed)
Start Amlodipine 5mg  daily to help lower blood pressure.  Use HCTZ 12.5mg  daily as needed for increased swelling.  Follow up in 4 weeks to recheck BP.

## 2014-01-26 ENCOUNTER — Telehealth: Payer: Self-pay | Admitting: Internal Medicine

## 2014-01-26 NOTE — Telephone Encounter (Signed)
emmi emailed °

## 2014-02-27 ENCOUNTER — Ambulatory Visit: Payer: Commercial Managed Care - HMO | Admitting: Internal Medicine

## 2014-03-09 ENCOUNTER — Ambulatory Visit (INDEPENDENT_AMBULATORY_CARE_PROVIDER_SITE_OTHER): Payer: Commercial Managed Care - HMO | Admitting: Internal Medicine

## 2014-03-09 ENCOUNTER — Encounter: Payer: Self-pay | Admitting: Internal Medicine

## 2014-03-09 VITALS — BP 126/55 | HR 88 | Temp 98.1°F | Ht 59.5 in | Wt 182.8 lb

## 2014-03-09 DIAGNOSIS — M1711 Unilateral primary osteoarthritis, right knee: Secondary | ICD-10-CM

## 2014-03-09 DIAGNOSIS — I10 Essential (primary) hypertension: Secondary | ICD-10-CM

## 2014-03-09 DIAGNOSIS — E669 Obesity, unspecified: Secondary | ICD-10-CM

## 2014-03-09 MED ORDER — TRAMADOL HCL 50 MG PO TABS: 100.0000 mg | ORAL_TABLET | Freq: Three times a day (TID) | ORAL | Status: DC | PRN

## 2014-03-09 NOTE — Progress Notes (Signed)
Subjective:    Patient ID: Amber Perez, female    DOB: 1946/03/04, 68 y.o.   MRN: [id number]  HPI  68YO female presents for follow up.  Feeling well. No concerns today. Compliant with medications.   Past medical, surgical, family and social history per today's encounter.  Review of Systems  Constitutional: Negative for fever, chills, appetite change, fatigue and unexpected weight change.  Eyes: Negative for visual disturbance.  Respiratory: Negative for shortness of breath.   Cardiovascular: Negative for chest pain and leg swelling.  Gastrointestinal: Negative for abdominal pain.  Musculoskeletal: Positive for arthralgias (chronic right knee).  Skin: Negative for color change and rash.  Neurological: Positive for numbness (occasional numbness in bilateral hands over tips of 1st and 2nd fingers).  Hematological: Negative for adenopathy. Does not bruise/bleed easily.  Psychiatric/Behavioral: Negative for dysphoric mood. The patient is not nervous/anxious.        Objective:    BP 126/55 mmHg  Pulse 88  Temp(Src) 98.1 F (36.7 C) (Oral)  Ht 4' 11.5" (1.511 m)  Wt 182 lb 12 oz (82.895 kg)  BMI 36.31 kg/m2  SpO2 96% Physical Exam  Constitutional: She is oriented to person, place, and time. She appears well-developed and well-nourished. No distress.  HENT:  Head: Normocephalic and atraumatic.  Right Ear: External ear normal.  Left Ear: External ear normal.  Nose: Nose normal.  Mouth/Throat: Oropharynx is clear and moist. No oropharyngeal exudate.  Eyes: Conjunctivae are normal. Pupils are equal, round, and reactive to light. Right eye exhibits no discharge. Left eye exhibits no discharge. No scleral icterus.  Neck: Normal range of motion. Neck supple. No tracheal deviation present. No thyromegaly present.  Cardiovascular: Normal rate, regular rhythm, normal heart sounds and intact distal pulses.  Exam reveals no gallop and no friction rub.   No murmur  heard. Pulmonary/Chest: Effort normal and breath sounds normal. No accessory muscle usage. No tachypnea. No respiratory distress. She has no decreased breath sounds. She has no wheezes. She has no rhonchi. She has no rales. She exhibits no tenderness.  Musculoskeletal: Normal range of motion. She exhibits no edema or tenderness.  Lymphadenopathy:    She has no cervical adenopathy.  Neurological: She is alert and oriented to person, place, and time. No cranial nerve deficit. She exhibits normal muscle tone. Coordination normal.  Skin: Skin is warm and dry. No rash noted. She is not diaphoretic. No erythema. No pallor.  Psychiatric: She has a normal mood and affect. Her behavior is normal. Judgment and thought content normal.          Assessment & Plan:   Problem List Items Addressed This Visit      Unprioritized   Essential hypertension - Primary    BP Readings from Last 3 Encounters:  03/09/14 126/55  01/25/14 160/92  12/21/13 168/92   BP well controlled on Amlodipine and HCTZ. Will continue.    Obesity (BMI 30-39.9)    Wt Readings from Last 3 Encounters:  03/09/14 182 lb 12 oz (82.895 kg)  01/25/14 180 lb (81.647 kg)  12/21/13 180 lb 8 oz (81.874 kg)   The patient is asked to make an attempt to improve diet and exercise patterns to aid in medical management of this problem.     Osteoarthritis of right knee    Chronic OA right knee. Symptoms generally well controlled with Tramadol. Will continue.    Relevant Medications      traMADol (ULTRAM) 50 MG tablet  Return in about 3 months (around 06/08/2014) for Wellness Visit.

## 2014-03-09 NOTE — Assessment & Plan Note (Signed)
BP Readings from Last 3 Encounters:  03/09/14 126/55  01/25/14 160/92  12/21/13 168/92   BP well controlled on Amlodipine and HCTZ. Will continue.

## 2014-03-09 NOTE — Assessment & Plan Note (Signed)
Wt Readings from Last 3 Encounters:  03/09/14 182 lb 12 oz (82.895 kg)  01/25/14 180 lb (81.647 kg)  12/21/13 180 lb 8 oz (81.874 kg)   The patient is asked to make an attempt to improve diet and exercise patterns to aid in medical management of this problem.

## 2014-03-09 NOTE — Patient Instructions (Signed)
Continue current medications. 

## 2014-03-09 NOTE — Progress Notes (Signed)
Pre visit review using our clinic review tool, if applicable. No additional management support is needed unless otherwise documented below in the visit note. 

## 2014-03-09 NOTE — Assessment & Plan Note (Signed)
Chronic OA right knee. Symptoms generally well controlled with Tramadol. Will continue.

## 2014-06-04 ENCOUNTER — Encounter: Payer: Self-pay | Admitting: Internal Medicine

## 2014-06-08 ENCOUNTER — Ambulatory Visit (INDEPENDENT_AMBULATORY_CARE_PROVIDER_SITE_OTHER): Payer: Commercial Managed Care - HMO | Admitting: Internal Medicine

## 2014-06-08 ENCOUNTER — Encounter: Payer: Self-pay | Admitting: Internal Medicine

## 2014-06-08 VITALS — BP 137/80 | HR 89 | Temp 97.9°F | Ht 59.75 in | Wt 179.2 lb

## 2014-06-08 DIAGNOSIS — Z0000 Encounter for general adult medical examination without abnormal findings: Secondary | ICD-10-CM

## 2014-06-08 DIAGNOSIS — Z23 Encounter for immunization: Secondary | ICD-10-CM

## 2014-06-08 DIAGNOSIS — M1711 Unilateral primary osteoarthritis, right knee: Secondary | ICD-10-CM

## 2014-06-08 DIAGNOSIS — Z780 Asymptomatic menopausal state: Secondary | ICD-10-CM

## 2014-06-08 DIAGNOSIS — Z1239 Encounter for other screening for malignant neoplasm of breast: Secondary | ICD-10-CM

## 2014-06-08 DIAGNOSIS — E669 Obesity, unspecified: Secondary | ICD-10-CM

## 2014-06-08 LAB — COMPREHENSIVE METABOLIC PANEL
ALBUMIN: 4.4 g/dL (ref 3.5–5.2)
ALT: 24 U/L (ref 0–35)
AST: 20 U/L (ref 0–37)
Alkaline Phosphatase: 63 U/L (ref 39–117)
BUN: 18 mg/dL (ref 6–23)
CHLORIDE: 101 meq/L (ref 96–112)
CO2: 29 meq/L (ref 19–32)
Calcium: 9.6 mg/dL (ref 8.4–10.5)
Creatinine, Ser: 0.68 mg/dL (ref 0.40–1.20)
GFR: 91.66 mL/min (ref >60.00)
Glucose, Bld: 109 mg/dL — ABNORMAL HIGH (ref 70–99)
Potassium: 3.4 mEq/L — ABNORMAL LOW (ref 3.5–5.1)
SODIUM: 136 meq/L (ref 135–145)
TOTAL PROTEIN: 7.5 g/dL (ref 6.0–8.3)
Total Bilirubin: 0.5 mg/dL (ref 0.2–1.2)

## 2014-06-08 LAB — CBC WITH DIFFERENTIAL/PLATELET
BASOS PCT: 1 % (ref 0.0–3.0)
Basophils Absolute: 0.1 10*3/uL (ref 0.0–0.1)
EOS ABS: 0.3 10*3/uL (ref 0.0–0.7)
EOS PCT: 3.4 % (ref 0.0–5.0)
HEMATOCRIT: 38.7 % (ref 36.0–46.0)
HEMOGLOBIN: 13.1 g/dL (ref 12.0–15.0)
LYMPHS ABS: 1.8 10*3/uL (ref 0.7–4.0)
Lymphocytes Relative: 21.3 % (ref 12.0–46.0)
MCHC: 34 g/dL (ref 30.0–36.0)
MCV: 87.1 fl (ref 78.0–100.0)
Monocytes Absolute: 0.7 10*3/uL (ref 0.1–1.0)
Monocytes Relative: 8.1 % (ref 3.0–12.0)
NEUTROS ABS: 5.4 10*3/uL (ref 1.4–7.7)
Neutrophils Relative %: 66.2 % (ref 43.0–77.0)
Platelets: 211 10*3/uL (ref 150.0–400.0)
RBC: 4.44 Mil/uL (ref 3.87–5.11)
RDW: 14.4 % (ref 11.5–15.5)
WBC: 8.2 10*3/uL (ref 4.0–10.5)

## 2014-06-08 LAB — MICROALBUMIN / CREATININE URINE RATIO
Creatinine,U: 193.5 mg/dL
MICROALB/CREAT RATIO: 1 mg/g (ref 0.0–30.0)
Microalb, Ur: 1.9 mg/dL (ref 0.0–1.9)

## 2014-06-08 LAB — LIPID PANEL
CHOL/HDL RATIO: 3
CHOLESTEROL: 181 mg/dL (ref 0–200)
HDL: 62.8 mg/dL (ref >39.00)
LDL Cholesterol: 95 mg/dL (ref 0–99)
NonHDL: 118.2
Triglycerides: 115 mg/dL (ref 0.0–149.0)
VLDL: 23 mg/dL (ref 0.0–40.0)

## 2014-06-08 LAB — HEMOGLOBIN A1C: Hgb A1c MFr Bld: 6.1 % (ref 4.6–6.5)

## 2014-06-08 LAB — VITAMIN D 25 HYDROXY (VIT D DEFICIENCY, FRACTURES): VITD: 33.43 ng/mL (ref 30.00–100.00)

## 2014-06-08 LAB — TSH: TSH: 1.68 u[IU]/mL (ref 0.35–4.50)

## 2014-06-08 MED ORDER — TRAMADOL HCL 50 MG PO TABS: 100.0000 mg | ORAL_TABLET | Freq: Three times a day (TID) | ORAL | Status: DC | PRN

## 2014-06-08 NOTE — Patient Instructions (Signed)

## 2014-06-08 NOTE — Assessment & Plan Note (Signed)
Symptoms well controlled. Continue prn Tramadol.

## 2014-06-08 NOTE — Assessment & Plan Note (Signed)
Wt Readings from Last 3 Encounters:  06/08/14 179 lb 4 oz (81.307 kg)  03/09/14 182 lb 12 oz (82.895 kg)  01/25/14 180 lb (81.647 kg)   Body mass index is 35.28 kg/(m^2). Encouraged healthy diet and exercise.

## 2014-06-08 NOTE — Progress Notes (Signed)
Subjective:    Patient ID: Amber Perez, female    DOB: July 04, 1945, 69 y.o.   MRN: [id number]  HPI  The patient is here for annual Medicare wellness examination and management of other chronic and acute problems.   The risk factors are reflected in the social history.  The roster of all physicians providing medical care to patient - is listed in the Snapshot section of the chart.  Activities of daily living:  The patient is 100% independent in all ADLs: dressing, toileting, feeding as well as independent mobility. Lives alone. No pets. 1 story home. Mostly hard wood floors.  Home safety : The patient has smoke detectors in the home. They wear seatbelts.  There are no firearms at home. There is no violence in the home.   There is no risks for hepatitis, STDs or HIV. There is no history of blood transfusion. They have no travel history to infectious disease endemic areas of the world.  The patient has seen their dentist in the last six month. (Dr. Rosalita Levan)  They have seen their eye doctor in the last year. (Dr. Ellin Mayhew) Eval for hearing aids in the past, found not be helpful. Stable x 15 years. They have deferred audiologic testing in the last year.  They do not have excessive sun exposure. Discussed the need for sun protection: hats, long sleeves and use of sunscreen if there is significant sun exposure. Dermatologist - Dr. Aubery Lapping.  Diet: the importance of a healthy diet is discussed. They do have a healthy diet.  The benefits of regular aerobic exercise were discussed. Walking continues to be limited because of knee pain.Doing water aerobics at [facility] and stationary bike.  Depression screen: there are no signs or vegative symptoms of depression- irritability, change in appetite, anhedonia, sadness/tearfullness.  Cognitive assessment: the patient manages all their financial and personal affairs and is actively engaged. They could relate day,date,year and events.  The  following portions of the patient's history were reviewed and updated as appropriate: allergies, current medications, past family history, past medical history,  past surgical history, past social history  and problem list.  Visual acuity was not assessed per patient preference since she has regular follow up with her ophthalmologist. Hearing and body mass index were assessed and reviewed.   During the course of the visit the patient was educated and counseled about appropriate screening and preventive services including : fall prevention , diabetes screening, nutrition counseling, colorectal cancer screening, and recommended immunizations.     Past medical, surgical, family and social history per today's encounter.  Review of Systems  Constitutional: Negative for fever, chills, appetite change, fatigue and unexpected weight change.  HENT: Positive for congestion. Negative for postnasal drip, rhinorrhea, sinus pressure, sore throat, trouble swallowing and voice change.   Eyes: Negative for visual disturbance.  Respiratory: Negative for shortness of breath.   Cardiovascular: Negative for chest pain and leg swelling.  Gastrointestinal: Negative for nausea, vomiting, abdominal pain, diarrhea and constipation.  Musculoskeletal: Negative for myalgias and arthralgias.  Skin: Negative for color change and rash.  Hematological: Negative for adenopathy. Does not bruise/bleed easily.  Psychiatric/Behavioral: Negative for suicidal ideas, sleep disturbance and dysphoric mood. The patient is not nervous/anxious.        Objective:    BP 137/80 mmHg  Pulse 89  Temp(Src) 97.9 F (36.6 C) (Oral)  Ht 4' 11.75" (1.518 m)  Wt 179 lb 4 oz (81.307 kg)  BMI 35.28 kg/m2  SpO2 98% Physical Exam  Constitutional: She is oriented to person, place, and time. She appears well-developed and well-nourished. No distress.  HENT:  Head: Normocephalic and atraumatic.  Right Ear: External ear normal.  Left Ear:  External ear normal.  Nose: Nose normal.  Mouth/Throat: Oropharynx is clear and moist. No oropharyngeal exudate.  Eyes: Conjunctivae are normal. Pupils are equal, round, and reactive to light. Right eye exhibits no discharge. Left eye exhibits no discharge. No scleral icterus.  Neck: Normal range of motion. Neck supple. No tracheal deviation present. No thyromegaly present.  Cardiovascular: Normal rate, regular rhythm, normal heart sounds and intact distal pulses.  Exam reveals no gallop and no friction rub.   No murmur heard. Pulmonary/Chest: Effort normal and breath sounds normal. No accessory muscle usage. No tachypnea. No respiratory distress. She has no decreased breath sounds. She has no wheezes. She has no rales. She exhibits no tenderness. Right breast exhibits no inverted nipple, no mass, no nipple discharge, no skin change and no tenderness. Left breast exhibits no inverted nipple, no mass, no nipple discharge, no skin change and no tenderness. Breasts are symmetrical.  Abdominal: Soft. Bowel sounds are normal. She exhibits no distension and no mass. There is no tenderness. There is no rebound and no guarding.  Musculoskeletal: Normal range of motion. She exhibits no edema or tenderness.  Lymphadenopathy:    She has no cervical adenopathy.  Neurological: She is alert and oriented to person, place, and time. No cranial nerve deficit. She exhibits normal muscle tone. Coordination normal.  Skin: Skin is warm and dry. No rash noted. She is not diaphoretic. No erythema. No pallor.  Psychiatric: She has a normal mood and affect. Her behavior is normal. Judgment and thought content normal.          Assessment & Plan:   Problem List Items Addressed This Visit      Unprioritized   Medicare annual wellness visit, subsequent - Primary    General medical exam including breast exam normal today. PAP and pelvic deferred as s/p hysterectomy. Mammogram ordered. Bone density ordered. Colonoscopy  UTD. Prevnar given today. Labs today including CBC, CMP, lipids, TSH, A1c. Follow up in 6 months and prn.      Relevant Orders   CBC with Differential/Platelet   Comprehensive metabolic panel   Lipid panel   Vit D  25 hydroxy (rtn osteoporosis monitoring)   TSH   Hemoglobin A1c   Microalbumin / creatinine urine ratio   Obesity (BMI 30-39.9)    Wt Readings from Last 3 Encounters:  06/08/14 179 lb 4 oz (81.307 kg)  03/09/14 182 lb 12 oz (82.895 kg)  01/25/14 180 lb (81.647 kg)   Body mass index is 35.28 kg/(m^2). Encouraged healthy diet and exercise.      Osteoarthritis of right knee    Symptoms well controlled. Continue prn Tramadol.      Relevant Medications   traMADol (ULTRAM) 50 MG tablet    Other Visit Diagnoses    Screening for breast cancer        Relevant Orders    MM Digital Screening    Postmenopausal estrogen deficiency        Relevant Orders    DG Bone Density        Return in about 6 months (around 12/09/2014).

## 2014-06-08 NOTE — Addendum Note (Signed)
Addended by: Vernetta Honey on: 06/08/2014 11:35 AM   Modules accepted: Orders

## 2014-06-08 NOTE — Assessment & Plan Note (Signed)
General medical exam including breast exam normal today. PAP and pelvic deferred as s/p hysterectomy. Mammogram ordered. Bone density ordered. Colonoscopy UTD. Prevnar given today. Labs today including CBC, CMP, lipids, TSH, A1c. Follow up in 6 months and prn.

## 2014-06-08 NOTE — Progress Notes (Signed)
Pre visit review using our clinic review tool, if applicable. No additional management support is needed unless otherwise documented below in the visit note. 

## 2014-06-21 ENCOUNTER — Encounter: Payer: Self-pay | Admitting: Internal Medicine

## 2014-06-21 ENCOUNTER — Ambulatory Visit (INDEPENDENT_AMBULATORY_CARE_PROVIDER_SITE_OTHER): Payer: Commercial Managed Care - HMO | Admitting: Internal Medicine

## 2014-06-21 VITALS — BP 129/81 | HR 85 | Temp 97.9°F | Ht 59.75 in | Wt 178.5 lb

## 2014-06-21 DIAGNOSIS — J0100 Acute maxillary sinusitis, unspecified: Secondary | ICD-10-CM | POA: Insufficient documentation

## 2014-06-21 MED ORDER — HYDROCOD POLST-CHLORPHEN POLST 10-8 MG/5ML PO LQCR: 5.0000 mL | Freq: Two times a day (BID) | ORAL | Status: DC | PRN

## 2014-06-21 MED ORDER — SULFAMETHOXAZOLE-TRIMETHOPRIM 800-160 MG PO TABS: 1.0000 | ORAL_TABLET | Freq: Two times a day (BID) | ORAL | Status: DC

## 2014-06-21 MED ORDER — PREDNISONE 10 MG PO TABS: ORAL_TABLET | ORAL | Status: DC

## 2014-06-21 NOTE — Patient Instructions (Signed)

## 2014-06-21 NOTE — Progress Notes (Signed)
Pre visit review using our clinic review tool, if applicable. No additional management support is needed unless otherwise documented below in the visit note. 

## 2014-06-21 NOTE — Assessment & Plan Note (Signed)
Symptoms and exam c/w acute maxillary sinusitis. Start Bactrim, Prednisone taper. Tussionex as needed for cough. Follow up if symptoms are not improving.

## 2014-06-21 NOTE — Progress Notes (Signed)
   Subjective:    Patient ID: Amber Perez, female    DOB: 14-Mar-1946, 69 y.o.   MRN: [id number]  HPI  69YO female presents for acute visit.  Nasal congestion, cough x over 2 weeks. Nasal congestion now purulent. Notes some chest tightness. No dyspnea. Cough productive of thick sputum. No fever, chills.  Taking Nyquil and Nasacort with minimal improvement. Also taking Zyrtec.  Past medical, surgical, family and social history per today's encounter.  Review of Systems  Constitutional: Positive for fatigue. Negative for fever, chills and unexpected weight change.  HENT: Positive for congestion, postnasal drip, rhinorrhea and sinus pressure. Negative for ear discharge, ear pain, facial swelling, hearing loss, mouth sores, nosebleeds, sneezing, sore throat, tinnitus, trouble swallowing and voice change.   Eyes: Negative for pain, discharge, redness and visual disturbance.  Respiratory: Positive for cough and chest tightness. Negative for shortness of breath, wheezing and stridor.   Cardiovascular: Negative for chest pain, palpitations and leg swelling.  Musculoskeletal: Negative for myalgias, arthralgias, neck pain and neck stiffness.  Skin: Negative for color change and rash.  Neurological: Negative for dizziness, weakness, light-headedness and headaches.  Hematological: Negative for adenopathy.       Objective:    BP 129/81 mmHg  Pulse 85  Temp(Src) 97.9 F (36.6 C) (Oral)  Ht 4' 11.75" (1.518 m)  Wt 178 lb 8 oz (80.967 kg)  BMI 35.14 kg/m2  SpO2 95% Physical Exam  Constitutional: She is oriented to person, place, and time. She appears well-developed and well-nourished. No distress.  HENT:  Head: Normocephalic and atraumatic.  Right Ear: External ear normal.  Left Ear: External ear normal.  Nose: Mucosal edema present. Right sinus exhibits maxillary sinus tenderness. Left sinus exhibits maxillary sinus tenderness.  Mouth/Throat: Oropharynx is clear and moist. No  oropharyngeal exudate.  Eyes: Conjunctivae are normal. Pupils are equal, round, and reactive to light. Right eye exhibits no discharge. Left eye exhibits no discharge. No scleral icterus.  Neck: Normal range of motion. Neck supple. No tracheal deviation present. No thyromegaly present.  Cardiovascular: Normal rate, regular rhythm, normal heart sounds and intact distal pulses.  Exam reveals no gallop and no friction rub.   No murmur heard. Pulmonary/Chest: Effort normal. No accessory muscle usage. No tachypnea. No respiratory distress. She has no decreased breath sounds. She has no wheezes. She has rhonchi (scattered). She has no rales. She exhibits no tenderness.  Musculoskeletal: Normal range of motion. She exhibits no edema or tenderness.  Lymphadenopathy:    She has no cervical adenopathy.  Neurological: She is alert and oriented to person, place, and time. No cranial nerve deficit. She exhibits normal muscle tone. Coordination normal.  Skin: Skin is warm and dry. No rash noted. She is not diaphoretic. No erythema. No pallor.  Psychiatric: She has a normal mood and affect. Her behavior is normal. Judgment and thought content normal.          Assessment & Plan:   Problem List Items Addressed This Visit      Unprioritized   Acute maxillary sinusitis - Primary    Symptoms and exam c/w acute maxillary sinusitis. Start Bactrim, Prednisone taper. Tussionex as needed for cough. Follow up if symptoms are not improving.      Relevant Medications   sulfamethoxazole-trimethoprim (BACTRIM DS) tablet 800-160 mg   predniSONE (DELTASONE) tablet   chlorpheniramine-HYDROcodone (TUSSIONEX) suspension 8-10 mg/85mL       Return if symptoms worsen or fail to improve.

## 2014-07-10 ENCOUNTER — Ambulatory Visit
Admit: 2014-07-10 | Disposition: A | Discharge status: Home / Self Care | Payer: Self-pay | Attending: Internal Medicine | Admitting: Internal Medicine

## 2014-07-10 DIAGNOSIS — Z1231 Encounter for screening mammogram for malignant neoplasm of breast: Secondary | ICD-10-CM | POA: Diagnosis not present

## 2014-07-10 LAB — HM MAMMOGRAPHY: HM Mammogram: NEGATIVE

## 2014-07-18 ENCOUNTER — Other Ambulatory Visit: Payer: Self-pay | Admitting: *Deleted

## 2014-07-18 DIAGNOSIS — K219 Gastro-esophageal reflux disease without esophagitis: Secondary | ICD-10-CM

## 2014-07-18 MED ORDER — PANTOPRAZOLE SODIUM 40 MG PO TBEC: 40.0000 mg | DELAYED_RELEASE_TABLET | Freq: Every day | ORAL | Status: DC

## 2014-07-27 NOTE — Op Note (Signed)
PATIENT NAME:  Amber Perez, Amber Perez MR#:  [id number] DATE OF BIRTH:  1946-02-09  DATE OF PROCEDURE:  06/01/2012  PREOPERATIVE DIAGNOSIS: Internal derangement of the right knee.   POSTOPERATIVE DIAGNOSES:  1. Tear of the posterior horn of medial meniscus, right knee.  2. Tear of the anterior horn of lateral meniscus, right knee.   PROCEDURES PERFORMED:  1. Knee arthroscopy.  2. Partial medial and lateral meniscectomies.   SURGEON: Laurice Record. Holley Bouche., MD   ANESTHESIA: General.   ESTIMATED BLOOD LOSS: Minimal.   FLUIDS REPLACED: 1000 mL of crystalloid.   TOURNIQUET TIME: Not used.   DRAINS: None.   INDICATIONS FOR SURGERY: The patient is a 69 year old female who has been seen for complaints of right knee pain. She has not seen any significant improvement despite conservative nonsurgical intervention. MRI demonstrated findings consistent with meniscal pathology. After discussion of the risks and benefits of surgical intervention, the patient expressed understanding of the risks and benefits and agreed with plans for surgical intervention.   PROCEDURE IN DETAIL: The patient was brought into the operating room, and after adequate general anesthesia was achieved, a tourniquet was placed on the patient's right thigh and leg was placed in a leg holder. All bony prominences were well padded. The patient's right knee and leg were cleaned and prepped with alcohol and DuraPrep and draped in the usual sterile fashion. A "timeout" was performed as per usual protocol. The anticipated portal sites were injected with 0.25% Marcaine with epinephrine. An anterolateral portal was created, and a cannula was inserted. A small effusion was evacuated. The scope was inserted, and the knee was distended with fluid using the Stryker pump. The scope was advanced down the medial gutter into the medial compartment of the knee. Under visualization with the scope, an anteromedial portal was created and hook probe was  inserted. Inspection of the medial compartment demonstrated complex degenerative tear along the posterior horn of the medial meniscus. This was debrided using meniscal punches and a 4.5 mm shaver. Final contouring was performed using the 50 degree ArthroCare wand. The remaining rim of meniscus along the posterior horn was visualized and probed and felt to be stable. Anterior horn of the medial meniscus was probed and also felt to be stable. Only mild fraying was noted to the articular cartilage, primarily along the medial femoral condyle. The scope was then advanced into the intercondylar region. The anterior cruciate ligament was visualized and probed and felt to stable. The scope was removed from the anterolateral portal and reinserted via the anteromedial portal so as to better visualize the lateral compartment. The articular surface was in good condition in the lateral compartment. The posterior horn was visualized and probed and felt to be stable. There was a relatively small radial tear involving the anterior horn of the lateral meniscus. This was debrided using backbiting meniscal punches and 4.5 mm shaver, with final contouring performed using the 50 degree ArthroCare wand. Some lesser degenerative changes were noted along the more medial extent of the anterior horn, and these were also debrided using the ArthroCare wand. Finally, the scope was positioned so as to visualize the patellofemoral articulation. Good patellar tracking was noted. The articular surface was in good condition.   The knee was irrigated with copious amounts of fluid and then suctioned dry. The anterolateral portal was reapproximated using #3-0 nylon. A combination of 0.25% Marcaine with epinephrine and 4 mg of morphine was injected via the scope. The scope was removed, and the anteromedial portal  was reapproximated using #3-0 nylon. A sterile dressing was applied, followed by application of ice wrap.   The patient tolerated the  procedure well. She was transported to the recovery room in stable condition.   ____________________________ Laurice Record. Holley Bouche., MD jph:OSi D: 06/02/2012 06:42:56 ET T: 06/02/2012 07:15:09 ET JOB#: [id number]  cc: Jeneen Rinks P. Holley Bouche., MD, <Dictator> Laurice Record Holley Bouche MD ELECTRONICALLY SIGNED 06/02/2012 19:18

## 2014-11-19 ENCOUNTER — Encounter: Payer: Self-pay | Admitting: Internal Medicine

## 2014-11-19 DIAGNOSIS — Z961 Presence of intraocular lens: Secondary | ICD-10-CM | POA: Diagnosis not present

## 2014-11-20 ENCOUNTER — Encounter: Payer: Self-pay | Admitting: Internal Medicine

## 2014-12-21 ENCOUNTER — Telehealth: Payer: Self-pay | Admitting: Internal Medicine

## 2014-12-31 DIAGNOSIS — M1711 Unilateral primary osteoarthritis, right knee: Secondary | ICD-10-CM | POA: Diagnosis not present

## 2014-12-31 DIAGNOSIS — M25561 Pain in right knee: Secondary | ICD-10-CM | POA: Diagnosis not present

## 2015-01-11 DIAGNOSIS — M1711 Unilateral primary osteoarthritis, right knee: Secondary | ICD-10-CM | POA: Diagnosis not present

## 2015-01-18 DIAGNOSIS — M1711 Unilateral primary osteoarthritis, right knee: Secondary | ICD-10-CM | POA: Diagnosis not present

## 2015-01-25 DIAGNOSIS — M1711 Unilateral primary osteoarthritis, right knee: Secondary | ICD-10-CM | POA: Diagnosis not present

## 2015-01-31 ENCOUNTER — Other Ambulatory Visit: Payer: Self-pay | Admitting: Internal Medicine

## 2015-05-08 DIAGNOSIS — Z1283 Encounter for screening for malignant neoplasm of skin: Secondary | ICD-10-CM | POA: Diagnosis not present

## 2015-05-08 DIAGNOSIS — B353 Tinea pedis: Secondary | ICD-10-CM | POA: Diagnosis not present

## 2015-05-08 DIAGNOSIS — L570 Actinic keratosis: Secondary | ICD-10-CM | POA: Diagnosis not present

## 2015-05-08 DIAGNOSIS — L728 Other follicular cysts of the skin and subcutaneous tissue: Secondary | ICD-10-CM | POA: Diagnosis not present

## 2015-05-20 ENCOUNTER — Other Ambulatory Visit: Payer: Self-pay | Admitting: Internal Medicine

## 2015-05-20 DIAGNOSIS — M1711 Unilateral primary osteoarthritis, right knee: Secondary | ICD-10-CM

## 2015-05-20 MED ORDER — TRAMADOL HCL 50 MG PO TABS: 100.0000 mg | ORAL_TABLET | Freq: Three times a day (TID) | ORAL | Status: DC | PRN

## 2015-05-20 NOTE — Telephone Encounter (Signed)
Patient requesting refill, last visit was 3/17, last refill was 06/08/2014.  Please advise?

## 2015-05-20 NOTE — Telephone Encounter (Signed)
traMADol (ULTRAM) 50 MG tablet ° °

## 2015-06-20 ENCOUNTER — Encounter: Payer: Self-pay | Admitting: Internal Medicine

## 2015-07-12 ENCOUNTER — Encounter: Payer: Self-pay | Admitting: Internal Medicine

## 2015-07-12 ENCOUNTER — Other Ambulatory Visit: Payer: Self-pay

## 2015-07-12 ENCOUNTER — Ambulatory Visit (INDEPENDENT_AMBULATORY_CARE_PROVIDER_SITE_OTHER): Payer: Commercial Managed Care - HMO | Admitting: Internal Medicine

## 2015-07-12 VITALS — BP 132/83 | HR 78 | Temp 98.1°F | Ht 59.25 in | Wt 176.2 lb

## 2015-07-12 DIAGNOSIS — J309 Allergic rhinitis, unspecified: Secondary | ICD-10-CM | POA: Diagnosis not present

## 2015-07-12 DIAGNOSIS — Z1239 Encounter for other screening for malignant neoplasm of breast: Secondary | ICD-10-CM

## 2015-07-12 DIAGNOSIS — M1711 Unilateral primary osteoarthritis, right knee: Secondary | ICD-10-CM

## 2015-07-12 DIAGNOSIS — Z0000 Encounter for general adult medical examination without abnormal findings: Secondary | ICD-10-CM

## 2015-07-12 DIAGNOSIS — K219 Gastro-esophageal reflux disease without esophagitis: Secondary | ICD-10-CM

## 2015-07-12 LAB — CBC WITH DIFFERENTIAL/PLATELET
Basophils Absolute: 59 cells/uL (ref 0–200)
Basophils Relative: 1 %
EOS PCT: 2 %
Eosinophils Absolute: 118 cells/uL (ref 15–500)
HCT: 40.1 % (ref 35.0–45.0)
HEMOGLOBIN: 13.4 g/dL (ref 11.7–15.5)
LYMPHS ABS: 2419 {cells}/uL (ref 850–3900)
LYMPHS PCT: 41 %
MCH: 29.1 pg (ref 27.0–33.0)
MCHC: 33.4 g/dL (ref 32.0–36.0)
MCV: 87.2 fL (ref 80.0–100.0)
MPV: 11.2 fL (ref 7.5–12.5)
Monocytes Absolute: 413 cells/uL (ref 200–950)
Monocytes Relative: 7 %
NEUTROS PCT: 49 %
Neutro Abs: 2891 cells/uL (ref 1500–7800)
Platelets: 245 10*3/uL (ref 140–400)
RBC: 4.6 MIL/uL (ref 3.80–5.10)
RDW: 14.5 % (ref 11.0–15.0)
WBC: 5.9 10*3/uL (ref 3.8–10.8)

## 2015-07-12 MED ORDER — ESTRADIOL 0.1 MG/GM VA CREA: 1.0000 | TOPICAL_CREAM | Freq: Every day | VAGINAL | Status: DC

## 2015-07-12 MED ORDER — PANTOPRAZOLE SODIUM 40 MG PO TBEC: 40.0000 mg | DELAYED_RELEASE_TABLET | Freq: Every day | ORAL | Status: DC

## 2015-07-12 MED ORDER — AMLODIPINE BESYLATE 5 MG PO TABS: ORAL_TABLET | ORAL | Status: DC

## 2015-07-12 MED ORDER — TRAMADOL HCL 50 MG PO TABS: 100.0000 mg | ORAL_TABLET | Freq: Three times a day (TID) | ORAL | Status: DC | PRN

## 2015-07-12 MED ORDER — CETIRIZINE HCL 10 MG PO TABS: 10.0000 mg | ORAL_TABLET | Freq: Every day | ORAL | Status: DC

## 2015-07-12 NOTE — Assessment & Plan Note (Signed)
General medical exam normal today. Mammogram and bone density testing ordered. PAP and pelvic deferred as s/p hysterectomy. Labs ordered. Immunizations UTD. Colonoscopy UTD. Encouraged healthy diet and exercise.

## 2015-07-12 NOTE — Progress Notes (Signed)
Subjective:    Patient ID: Amber Perez, female    DOB: May 14, 1945, 70 y.o.   MRN: [id number]  HPI  The patient is here for annual Medicare wellness examination and management of other chronic and acute problems.   The risk factors are reflected in the social history.  The roster of all physicians providing medical care to patient - is listed in the Snapshot section of the chart.  Activities of daily living:  The patient is 100% independent in all ADLs: dressing, toileting, feeding as well as independent mobility. Lives alone. No pets. 1 story home. Mostly hard wood floors.  Home safety : The patient has smoke detectors in the home. They wear seatbelts.  There are no firearms at home. There is no violence in the home.   There is no risks for hepatitis, STDs or HIV. There is no history of blood transfusion. They have no travel history to infectious disease endemic areas of the world.  The patient has seen their dentist in the last six month. (Dr. Rosalita Levan)  They have seen their eye doctor in the last year. (Dr. Ellin Mayhew) Eval for hearing aids in the past, found not be helpful. Stable x 15 years. They have deferred audiologic testing in the last year.  They do not have excessive sun exposure. Discussed the need for sun protection: hats, long sleeves and use of sunscreen if there is significant sun exposure. Dermatologist - Dr. Aubery Lapping.  Diet: the importance of a healthy diet is discussed. They do have a healthy diet.  The benefits of regular aerobic exercise were discussed. Walking continues to be limited because of knee pain.Doing water aerobics at [facility] and stationary bike.  Depression screen: there are no signs or vegative symptoms of depression- irritability, change in appetite, anhedonia, sadness/tearfullness.  Cognitive assessment: the patient manages all their financial and personal affairs and is actively engaged. They could relate day,date,year and events.  The  following portions of the patient's history were reviewed and updated as appropriate: allergies, current medications, past family history, past medical history,  past surgical history, past social history  and problem list.  Visual acuity was not assessed per patient preference since she has regular follow up with her ophthalmologist. Hearing and body mass index were assessed and reviewed.   During the course of the visit the patient was educated and counseled about appropriate screening and preventive services including : fall prevention , diabetes screening, nutrition counseling, colorectal cancer screening, and recommended immunizations.        Wt Readings from Last 3 Encounters:  07/12/15 176 lb 4 oz (79.946 kg)  06/21/14 178 lb 8 oz (80.967 kg)  06/08/14 179 lb 4 oz (81.307 kg)   BP Readings from Last 3 Encounters:  07/12/15 132/83  06/21/14 129/81  06/08/14 137/80    Past Medical History  Diagnosis Date  . Thyroid nodule   . Osteoarthritis   . COPD (chronic obstructive pulmonary disease) ([place])   . Dyslipidemia   . Osteoporosis   . Vitamin D deficiency   . GERD (gastroesophageal reflux disease)   . Closed left arm fracture     [facility]   . Hypertension 2008   Family History  Problem Relation Age of Onset  . Anemia Mother     aplastic  . Aplastic anemia Mother   . Throat cancer Father   . Hypertension Father   . Alcohol abuse Father   . COPD Father   . Diabetes Sister   .  Esophageal cancer Brother   . Colon cancer Maternal Aunt   . Breast cancer Neg Hx   . Colon polyps Sister   . Diabetes Brother   . Diabetes Brother    Past Surgical History  Procedure Laterality Date  . Abdominal hysterectomy  1995  . Ganglion cyst excision    . Uterine fibroid surgery      x 5  . Breast surgery      Benign per pt's report  . Bilateral salpingoophorectomy  1995  . Cataract extraction  2009  . Colonoscopy  2007  . Cesarean section  [id number]  . Knee  arthroscopy w/ meniscal repair Right 04/2012    [name]   Social History   Social History  . Marital Status: Divorced    Spouse Name: N/A  . Number of Children: 2  . Years of Education: N/A   Occupational History  . 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 Smoking status: Former Smoker    Quit date: 03/18/2006  . Smokeless tobacco: Never Used  . Alcohol Use: Yes     Comment: Occasional  . Drug Use: No  . Sexual Activity: Not Asked   Other Topics Concern  . None   Social History Narrative   Regular Exercise -  Occasional   Daily Caffeine Use:  2 cups coffee & Diet Mt Dew    Review of Systems  Constitutional: Negative for fever, chills, appetite change, fatigue and unexpected weight change.  HENT: Positive for postnasal drip, rhinorrhea and sneezing. Negative for congestion, sore throat and trouble swallowing.   Eyes: Negative for visual disturbance.  Respiratory: Negative for cough, chest tightness, shortness of breath and wheezing.   Cardiovascular: Negative for chest pain and leg swelling.  Gastrointestinal: Negative for nausea, vomiting, abdominal pain, diarrhea, constipation and blood in stool.  Musculoskeletal: Negative for myalgias and arthralgias.  Skin: Negative for color change and rash.  Hematological: Negative for adenopathy. Does not bruise/bleed easily.  Psychiatric/Behavioral: Negative for sleep disturbance and dysphoric mood. The patient is not nervous/anxious.        Objective:    BP 132/83 mmHg  Pulse 78  Temp(Src) 98.1 F (36.7 C) (Oral)  Ht 4' 11.25" (1.505 m)  Wt 176 lb 4 oz (79.946 kg)  BMI 35.30 kg/m2  SpO2 95% Physical Exam  Constitutional: She is oriented to person, place, and time. She appears well-developed and well-nourished. No distress.  HENT:  Head: Normocephalic and atraumatic.  Right Ear: External ear normal.  Left Ear: External ear normal.  Nose: Nose normal.  Mouth/Throat: Oropharynx is clear and  moist. No oropharyngeal exudate.  Eyes: Conjunctivae and EOM are normal. Pupils are equal, round, and reactive to light. Right eye exhibits no discharge. Left eye exhibits no discharge. No scleral icterus.  Neck: Normal range of motion. Neck supple. No tracheal deviation present. No thyromegaly present.  Cardiovascular: Normal rate, regular rhythm, normal heart sounds and intact distal pulses.  Exam reveals no gallop and no friction rub.   No murmur heard. Pulmonary/Chest: Effort normal and breath sounds normal. No respiratory distress. She has no wheezes. She has no rales. She exhibits no tenderness.  Abdominal: Soft. Bowel sounds are normal. She exhibits no distension and no mass. There is no tenderness. There is no rebound and no guarding.  Genitourinary: Uterus normal.  Musculoskeletal: Normal range of motion. She exhibits no edema or tenderness.  Lymphadenopathy:    She has no cervical adenopathy.  Neurological: She is alert and oriented to person, place, and time. No cranial nerve deficit. She exhibits normal muscle tone. Coordination normal.  Skin: Skin is warm and dry. No rash noted. She is not diaphoretic. No erythema. No pallor.  Psychiatric: She has a normal mood and affect. Her behavior is normal. Judgment and thought content normal.          Assessment & Plan:  Patient was given a handout regarding current recommendations for health maintenance and preventative care on the AVS.  Problem List Items Addressed This Visit      Unprioritized   Medicare annual wellness visit, subsequent - Primary    General medical exam normal today. Mammogram and bone density testing ordered. PAP and pelvic deferred as s/p hysterectomy. Labs ordered. Immunizations UTD. Colonoscopy UTD. Encouraged healthy diet and exercise.      Relevant Orders   DG Bone Density    Other Visit Diagnoses    Screening for breast cancer        Relevant Orders    MM Digital Screening    Allergic rhinitis,  unspecified allergic rhinitis type        Relevant Medications    cetirizine (ZYRTEC) 10 MG tablet        Return in about 6 months (around 01/11/2016).  Ronette Deter, MD [facility]

## 2015-07-12 NOTE — Patient Instructions (Signed)
Health Maintenance, Female Adopting a healthy lifestyle and getting preventive care can go a long way to promote health and wellness. Talk with your health care provider about what schedule of regular examinations is right for you. This is a good chance for you to check in with your provider about disease prevention and staying healthy. In between checkups, there are plenty of things you can do on your own. Experts have done a lot of research about which lifestyle changes and preventive measures are most likely to keep you healthy. Ask your health care provider for more information. WEIGHT AND DIET  Eat a healthy diet  Be sure to include plenty of vegetables, fruits, low-fat dairy products, and lean protein.  Do not eat a lot of foods high in solid fats, added sugars, or salt.  Get regular exercise. This is one of the most important things you can do for your health.  Most adults should exercise for at least 150 minutes each week. The exercise should increase your heart rate and make you sweat (moderate-intensity exercise).  Most adults should also do strengthening exercises at least twice a week. This is in addition to the moderate-intensity exercise.  Maintain a healthy weight  Body mass index (BMI) is a measurement that can be used to identify possible weight problems. It estimates body fat based on height and weight. Your health care provider can help determine your BMI and help you achieve or maintain a healthy weight.  For females 20 years of age and older:   A BMI below 18.5 is considered underweight.  A BMI of 18.5 to 24.9 is normal.  A BMI of 25 to 29.9 is considered overweight.  A BMI of 30 and above is considered obese.  Watch levels of cholesterol and blood lipids  You should start having your blood tested for lipids and cholesterol at 69 years of age, then have this test every 5 years.  You may need to have your cholesterol levels checked more often if:  Your lipid  or cholesterol levels are high.  You are older than 69 years of age.  You are at high risk for heart disease.  CANCER SCREENING   Lung Cancer  Lung cancer screening is recommended for adults 55-80 years old who are at high risk for lung cancer because of a history of smoking.  A yearly low-dose CT scan of the lungs is recommended for people who:  Currently smoke.  Have quit within the past 15 years.  Have at least a 30-pack-year history of smoking. A pack year is smoking an average of one pack of cigarettes a day for 1 year.  Yearly screening should continue until it has been 15 years since you quit.  Yearly screening should stop if you develop a health problem that would prevent you from having lung cancer treatment.  Breast Cancer  Practice breast self-awareness. This means understanding how your breasts normally appear and feel.  It also means doing regular breast self-exams. Let your health care provider know about any changes, no matter how small.  If you are in your 20s or 30s, you should have a clinical breast exam (CBE) by a health care provider every 1-3 years as part of a regular health exam.  If you are 40 or older, have a CBE every year. Also consider having a breast X-ray (mammogram) every year.  If you have a family history of breast cancer, talk to your health care provider about genetic screening.  If you   are at high risk for breast cancer, talk to your health care provider about having an MRI and a mammogram every year.  Breast cancer gene (BRCA) assessment is recommended for women who have family members with BRCA-related cancers. BRCA-related cancers include:  Breast.  Ovarian.  Tubal.  Peritoneal cancers.  Results of the assessment will determine the need for genetic counseling and BRCA1 and BRCA2 testing. Cervical Cancer Your health care provider may recommend that you be screened regularly for cancer of the pelvic organs (ovaries, uterus, and  vagina). This screening involves a pelvic examination, including checking for microscopic changes to the surface of your cervix (Pap test). You may be encouraged to have this screening done every 3 years, beginning at age 21.  For women ages 30-65, health care providers may recommend pelvic exams and Pap testing every 3 years, or they may recommend the Pap and pelvic exam, combined with testing for human papilloma virus (HPV), every 5 years. Some types of HPV increase your risk of cervical cancer. Testing for HPV may also be done on women of any age with unclear Pap test results.  Other health care providers may not recommend any screening for nonpregnant women who are considered low risk for pelvic cancer and who do not have symptoms. Ask your health care provider if a screening pelvic exam is right for you.  If you have had past treatment for cervical cancer or a condition that could lead to cancer, you need Pap tests and screening for cancer for at least 20 years after your treatment. If Pap tests have been discontinued, your risk factors (such as having a new sexual partner) need to be reassessed to determine if screening should resume. Some women have medical problems that increase the chance of getting cervical cancer. In these cases, your health care provider may recommend more frequent screening and Pap tests. Colorectal Cancer  This type of cancer can be detected and often prevented.  Routine colorectal cancer screening usually begins at 69 years of age and continues through 69 years of age.  Your health care provider may recommend screening at an earlier age if you have risk factors for colon cancer.  Your health care provider may also recommend using home test kits to check for hidden blood in the stool.  A small camera at the end of a tube can be used to examine your colon directly (sigmoidoscopy or colonoscopy). This is done to check for the earliest forms of colorectal  cancer.  Routine screening usually begins at age 50.  Direct examination of the colon should be repeated every 5-10 years through 69 years of age. However, you may need to be screened more often if early forms of precancerous polyps or small growths are found. Skin Cancer  Check your skin from head to toe regularly.  Tell your health care provider about any new moles or changes in moles, especially if there is a change in a mole's shape or color.  Also tell your health care provider if you have a mole that is larger than the size of a pencil eraser.  Always use sunscreen. Apply sunscreen liberally and repeatedly throughout the day.  Protect yourself by wearing long sleeves, pants, a wide-brimmed hat, and sunglasses whenever you are outside. HEART DISEASE, DIABETES, AND HIGH BLOOD PRESSURE   High blood pressure causes heart disease and increases the risk of stroke. High blood pressure is more likely to develop in:  People who have blood pressure in the high end   of the normal range (130-139/85-89 mm Hg).  People who are overweight or obese.  People who are African American.  If you are 38-23 years of age, have your blood pressure checked every 3-5 years. If you are 61 years of age or older, have your blood pressure checked every year. You should have your blood pressure measured twice--once when you are at a hospital or clinic, and once when you are not at a hospital or clinic. Record the average of the two measurements. To check your blood pressure when you are not at a hospital or clinic, you can use:  An automated blood pressure machine at a pharmacy.  A home blood pressure monitor.  If you are between 45 years and 39 years old, ask your health care provider if you should take aspirin to prevent strokes.  Have regular diabetes screenings. This involves taking a blood sample to check your fasting blood sugar level.  If you are at a normal weight and have a low risk for diabetes,  have this test once every three years after 68 years of age.  If you are overweight and have a high risk for diabetes, consider being tested at a younger age or more often. PREVENTING INFECTION  Hepatitis B  If you have a higher risk for hepatitis B, you should be screened for this virus. You are considered at high risk for hepatitis B if:  You were born in a country where hepatitis B is common. Ask your health care provider which countries are considered high risk.  Your parents were born in a high-risk country, and you have not been immunized against hepatitis B (hepatitis B vaccine).  You have HIV or AIDS.  You use needles to inject street drugs.  You live with someone who has hepatitis B.  You have had sex with someone who has hepatitis B.  You get hemodialysis treatment.  You take certain medicines for conditions, including cancer, organ transplantation, and autoimmune conditions. Hepatitis C  Blood testing is recommended for:  Everyone born from [birth year] through 1964.  Anyone with known risk factors for hepatitis C. Sexually transmitted infections (STIs)  You should be screened for sexually transmitted infections (STIs) including gonorrhea and chlamydia if:  You are sexually active and are younger than 69 years of age.  You are older than 69 years of age and your health care provider tells you that you are at risk for this type of infection.  Your sexual activity has changed since you were last screened and you are at an increased risk for chlamydia or gonorrhea. Ask your health care provider if you are at risk.  If you do not have HIV, but are at risk, it may be recommended that you take a prescription medicine daily to prevent HIV infection. This is called pre-exposure prophylaxis (PrEP). You are considered at risk if:  You are sexually active and do not regularly use condoms or know the HIV status of your partner(s).  You take drugs by injection.  You are sexually  active with a partner who has HIV. Talk with your health care provider about whether you are at high risk of being infected with HIV. If you choose to begin PrEP, you should first be tested for HIV. You should then be tested every 3 months for as long as you are taking PrEP.  PREGNANCY   If you are premenopausal and you may become pregnant, ask your health care provider about preconception counseling.  If you may  become pregnant, take 400 to 800 micrograms (mcg) of folic acid every day.  If you want to prevent pregnancy, talk to your health care provider about birth control (contraception). OSTEOPOROSIS AND MENOPAUSE   Osteoporosis is a disease in which the bones lose minerals and strength with aging. This can result in serious bone fractures. Your risk for osteoporosis can be identified using a bone density scan.  If you are 61 years of age or older, or if you are at risk for osteoporosis and fractures, ask your health care provider if you should be screened.  Ask your health care provider whether you should take a calcium or vitamin D supplement to lower your risk for osteoporosis.  Menopause may have certain physical symptoms and risks.  Hormone replacement therapy may reduce some of these symptoms and risks. Talk to your health care provider about whether hormone replacement therapy is right for you.  HOME CARE INSTRUCTIONS   Schedule regular health, dental, and eye exams.  Stay current with your immunizations.   Do not use any tobacco products including cigarettes, chewing tobacco, or electronic cigarettes.  If you are pregnant, do not drink alcohol.  If you are breastfeeding, limit how much and how often you drink alcohol.  Limit alcohol intake to no more than 1 drink per day for nonpregnant women. One drink equals 12 ounces of beer, 5 ounces of wine, or 1 ounces of hard liquor.  Do not use street drugs.  Do not share needles.  Ask your health care provider for help if  you need support or information about quitting drugs.  Tell your health care provider if you often feel depressed.  Tell your health care provider if you have ever been abused or do not feel safe at home.   This information is not intended to replace advice given to you by your health care provider. Make sure you discuss any questions you have with your health care provider.   Document Released: 10/06/2010 Document Revised: 04/13/2014 Document Reviewed: 02/22/2013 Elsevier Interactive Patient Education [employer].

## 2015-07-12 NOTE — Progress Notes (Signed)
Pre visit review using our clinic review tool, if applicable. No additional management support is needed unless otherwise documented below in the visit note. 

## 2015-07-13 LAB — COMPREHENSIVE METABOLIC PANEL
ALBUMIN: 4.2 g/dL (ref 3.6–5.1)
ALT: 22 U/L (ref 6–29)
AST: 20 U/L (ref 10–35)
Alkaline Phosphatase: 58 U/L (ref 33–130)
BUN: 16 mg/dL (ref 7–25)
CALCIUM: 8.9 mg/dL (ref 8.6–10.4)
CHLORIDE: 102 mmol/L (ref 98–110)
CO2: 23 mmol/L (ref 20–31)
CREATININE: 0.63 mg/dL (ref 0.50–0.99)
GLUCOSE: 79 mg/dL (ref 65–99)
Potassium: 4.4 mmol/L (ref 3.5–5.3)
SODIUM: 135 mmol/L (ref 135–146)
TOTAL PROTEIN: 6.9 g/dL (ref 6.1–8.1)
Total Bilirubin: 0.6 mg/dL (ref 0.2–1.2)

## 2015-07-13 LAB — LIPID PANEL
Cholesterol: 208 mg/dL — ABNORMAL HIGH (ref 125–200)
HDL: 58 mg/dL (ref >=46)
LDL CALC: 124 mg/dL (ref <130)
TRIGLYCERIDES: 128 mg/dL (ref <150)
Total CHOL/HDL Ratio: 3.6 Ratio (ref <=5.0)
VLDL: 26 mg/dL (ref <30)

## 2015-07-13 LAB — MICROALBUMIN / CREATININE URINE RATIO
CREATININE, URINE: 145 mg/dL (ref 20–320)
MICROALB UR: 0.5 mg/dL
MICROALB/CREAT RATIO: 3 ug/mg{creat} (ref <30)

## 2015-07-15 ENCOUNTER — Encounter: Payer: Self-pay | Admitting: *Deleted

## 2015-07-15 ENCOUNTER — Encounter: Payer: Commercial Managed Care - HMO | Admitting: Internal Medicine

## 2015-07-24 ENCOUNTER — Ambulatory Visit
Admission: RE | Admit: 2015-07-24 | Discharge: 2015-07-24 | Disposition: A | Discharge status: Home / Self Care | Payer: Commercial Managed Care - HMO | Source: Ambulatory Visit | Attending: Internal Medicine | Admitting: Internal Medicine

## 2015-07-24 DIAGNOSIS — Z1231 Encounter for screening mammogram for malignant neoplasm of breast: Secondary | ICD-10-CM | POA: Diagnosis not present

## 2015-07-24 DIAGNOSIS — M8580 Other specified disorders of bone density and structure, unspecified site: Secondary | ICD-10-CM | POA: Insufficient documentation

## 2015-07-24 DIAGNOSIS — Z780 Asymptomatic menopausal state: Secondary | ICD-10-CM | POA: Insufficient documentation

## 2015-07-24 DIAGNOSIS — Z0000 Encounter for general adult medical examination without abnormal findings: Secondary | ICD-10-CM

## 2015-07-24 DIAGNOSIS — Z13820 Encounter for screening for osteoporosis: Secondary | ICD-10-CM | POA: Insufficient documentation

## 2015-07-24 DIAGNOSIS — Z1239 Encounter for other screening for malignant neoplasm of breast: Secondary | ICD-10-CM

## 2015-07-24 DIAGNOSIS — M85852 Other specified disorders of bone density and structure, left thigh: Secondary | ICD-10-CM | POA: Diagnosis not present

## 2015-07-26 ENCOUNTER — Other Ambulatory Visit: Payer: Self-pay | Admitting: Internal Medicine

## 2015-07-26 DIAGNOSIS — R928 Other abnormal and inconclusive findings on diagnostic imaging of breast: Secondary | ICD-10-CM

## 2015-08-08 ENCOUNTER — Ambulatory Visit
Admission: RE | Admit: 2015-08-08 | Discharge: 2015-08-08 | Disposition: A | Discharge status: Home / Self Care | Payer: Commercial Managed Care - HMO | Source: Ambulatory Visit | Attending: Internal Medicine | Admitting: Internal Medicine

## 2015-08-08 ENCOUNTER — Other Ambulatory Visit: Payer: Self-pay | Admitting: Internal Medicine

## 2015-08-08 DIAGNOSIS — N6489 Other specified disorders of breast: Secondary | ICD-10-CM | POA: Diagnosis not present

## 2015-08-08 DIAGNOSIS — R928 Other abnormal and inconclusive findings on diagnostic imaging of breast: Secondary | ICD-10-CM | POA: Insufficient documentation

## 2015-09-05 NOTE — Telephone Encounter (Signed)
error 

## 2015-10-11 DIAGNOSIS — M25561 Pain in right knee: Secondary | ICD-10-CM | POA: Diagnosis not present

## 2015-10-11 DIAGNOSIS — M25562 Pain in left knee: Secondary | ICD-10-CM | POA: Diagnosis not present

## 2015-10-11 DIAGNOSIS — M170 Bilateral primary osteoarthritis of knee: Secondary | ICD-10-CM | POA: Diagnosis not present

## 2015-10-21 DIAGNOSIS — R262 Difficulty in walking, not elsewhere classified: Secondary | ICD-10-CM | POA: Diagnosis not present

## 2015-10-21 DIAGNOSIS — M1711 Unilateral primary osteoarthritis, right knee: Secondary | ICD-10-CM | POA: Diagnosis not present

## 2015-10-21 DIAGNOSIS — M170 Bilateral primary osteoarthritis of knee: Secondary | ICD-10-CM | POA: Diagnosis not present

## 2015-10-21 DIAGNOSIS — M25562 Pain in left knee: Secondary | ICD-10-CM | POA: Diagnosis not present

## 2015-10-21 DIAGNOSIS — M25561 Pain in right knee: Secondary | ICD-10-CM | POA: Diagnosis not present

## 2015-10-28 DIAGNOSIS — M25562 Pain in left knee: Secondary | ICD-10-CM | POA: Diagnosis not present

## 2015-10-28 DIAGNOSIS — M25561 Pain in right knee: Secondary | ICD-10-CM | POA: Diagnosis not present

## 2015-10-28 DIAGNOSIS — M170 Bilateral primary osteoarthritis of knee: Secondary | ICD-10-CM | POA: Diagnosis not present

## 2015-11-05 DIAGNOSIS — M25561 Pain in right knee: Secondary | ICD-10-CM | POA: Diagnosis not present

## 2015-11-05 DIAGNOSIS — M170 Bilateral primary osteoarthritis of knee: Secondary | ICD-10-CM | POA: Diagnosis not present

## 2015-11-05 DIAGNOSIS — M25562 Pain in left knee: Secondary | ICD-10-CM | POA: Diagnosis not present

## 2015-11-13 DIAGNOSIS — M170 Bilateral primary osteoarthritis of knee: Secondary | ICD-10-CM | POA: Diagnosis not present

## 2015-11-13 DIAGNOSIS — M25562 Pain in left knee: Secondary | ICD-10-CM | POA: Diagnosis not present

## 2015-11-13 DIAGNOSIS — M25561 Pain in right knee: Secondary | ICD-10-CM | POA: Diagnosis not present

## 2015-11-19 DIAGNOSIS — H26493 Other secondary cataract, bilateral: Secondary | ICD-10-CM | POA: Diagnosis not present

## 2015-11-19 DIAGNOSIS — Z961 Presence of intraocular lens: Secondary | ICD-10-CM | POA: Diagnosis not present

## 2015-11-20 DIAGNOSIS — M25562 Pain in left knee: Secondary | ICD-10-CM | POA: Diagnosis not present

## 2015-11-20 DIAGNOSIS — M170 Bilateral primary osteoarthritis of knee: Secondary | ICD-10-CM | POA: Diagnosis not present

## 2015-11-20 DIAGNOSIS — M25561 Pain in right knee: Secondary | ICD-10-CM | POA: Diagnosis not present

## 2016-01-22 ENCOUNTER — Other Ambulatory Visit: Payer: Self-pay | Admitting: Family Medicine

## 2016-01-22 ENCOUNTER — Telehealth: Payer: Self-pay | Admitting: Internal Medicine

## 2016-01-22 ENCOUNTER — Telehealth: Payer: Self-pay | Admitting: Family Medicine

## 2016-01-22 DIAGNOSIS — K219 Gastro-esophageal reflux disease without esophagitis: Secondary | ICD-10-CM

## 2016-01-22 DIAGNOSIS — M1711 Unilateral primary osteoarthritis, right knee: Secondary | ICD-10-CM

## 2016-01-22 DIAGNOSIS — R928 Other abnormal and inconclusive findings on diagnostic imaging of breast: Secondary | ICD-10-CM

## 2016-01-22 MED ORDER — AMLODIPINE BESYLATE 5 MG PO TABS: ORAL_TABLET | ORAL | 3 refills | Status: DC

## 2016-01-22 MED ORDER — PANTOPRAZOLE SODIUM 40 MG PO TBEC: 40.0000 mg | DELAYED_RELEASE_TABLET | Freq: Every day | ORAL | 3 refills | Status: DC

## 2016-01-22 MED ORDER — TRAMADOL HCL 50 MG PO TABS: 100.0000 mg | ORAL_TABLET | Freq: Three times a day (TID) | ORAL | 0 refills | Status: DC | PRN

## 2016-01-22 NOTE — Telephone Encounter (Signed)
Pt called and left a voicemail stating that she needs a refill on all her medication sent over to [facility], [place]. Pt also stated that her insurance company picked Dr. Lacinda Axon as her new provider. Please advise regarding medication and whether she needs an appointment first, thank you!  Call pt @ [phone]

## 2016-01-22 NOTE — Telephone Encounter (Signed)
Has not been seen since 07/2015 with Dr.Walker.

## 2016-01-22 NOTE — Telephone Encounter (Signed)
Pt needs refill of her tramadol, pantoprazole and amlodipine.

## 2016-01-22 NOTE — Telephone Encounter (Signed)
Pt has not been seen since 07/2015 by Dr.Walker. Please advise if you want her seen first?

## 2016-01-22 NOTE — Telephone Encounter (Signed)
Rx's sent (Tramadol to be faxed tomorrow).

## 2016-01-22 NOTE — Telephone Encounter (Signed)
Pt called about to get a mammo done. Pt states she needs to have a diagnostic mammo. Need order please. Pt goes to [facility]. Thank you!

## 2016-01-22 NOTE — Telephone Encounter (Signed)
Ordered (based on findings from earlier this year).

## 2016-01-23 ENCOUNTER — Encounter: Payer: Self-pay | Admitting: Family Medicine

## 2016-01-23 NOTE — Telephone Encounter (Signed)
rx faxed

## 2016-01-23 NOTE — Telephone Encounter (Signed)
Ok. Pt was notified. Thank you!

## 2016-01-23 NOTE — Telephone Encounter (Signed)
Ok. Pt is scheduled. Thank you! °

## 2016-02-12 ENCOUNTER — Ambulatory Visit
Admission: RE | Admit: 2016-02-12 | Discharge: 2016-02-12 | Disposition: A | Discharge status: Home / Self Care | Payer: Commercial Managed Care - HMO | Source: Ambulatory Visit | Attending: Family Medicine | Admitting: Family Medicine

## 2016-02-12 ENCOUNTER — Other Ambulatory Visit: Payer: Self-pay | Admitting: Family Medicine

## 2016-02-12 DIAGNOSIS — R928 Other abnormal and inconclusive findings on diagnostic imaging of breast: Secondary | ICD-10-CM | POA: Insufficient documentation

## 2016-02-18 ENCOUNTER — Encounter: Payer: Self-pay | Admitting: Family Medicine

## 2016-05-05 DIAGNOSIS — M1711 Unilateral primary osteoarthritis, right knee: Secondary | ICD-10-CM | POA: Diagnosis not present

## 2016-05-05 DIAGNOSIS — M25561 Pain in right knee: Secondary | ICD-10-CM | POA: Diagnosis not present

## 2016-05-28 DIAGNOSIS — M1711 Unilateral primary osteoarthritis, right knee: Secondary | ICD-10-CM | POA: Diagnosis not present

## 2016-06-08 ENCOUNTER — Encounter: Payer: Self-pay | Admitting: Family Medicine

## 2016-06-08 ENCOUNTER — Ambulatory Visit (INDEPENDENT_AMBULATORY_CARE_PROVIDER_SITE_OTHER): Payer: Commercial Managed Care - HMO | Admitting: Family Medicine

## 2016-06-08 VITALS — BP 133/85 | HR 83 | Temp 97.7°F | Wt 179.2 lb

## 2016-06-08 DIAGNOSIS — M1711 Unilateral primary osteoarthritis, right knee: Secondary | ICD-10-CM | POA: Diagnosis not present

## 2016-06-08 DIAGNOSIS — R739 Hyperglycemia, unspecified: Secondary | ICD-10-CM

## 2016-06-08 DIAGNOSIS — I10 Essential (primary) hypertension: Secondary | ICD-10-CM | POA: Diagnosis not present

## 2016-06-08 DIAGNOSIS — N952 Postmenopausal atrophic vaginitis: Secondary | ICD-10-CM

## 2016-06-08 DIAGNOSIS — Z130 Encounter for screening for diseases of the blood and blood-forming organs and certain disorders involving the immune mechanism: Secondary | ICD-10-CM

## 2016-06-08 DIAGNOSIS — R928 Other abnormal and inconclusive findings on diagnostic imaging of breast: Secondary | ICD-10-CM

## 2016-06-08 DIAGNOSIS — E785 Hyperlipidemia, unspecified: Secondary | ICD-10-CM | POA: Diagnosis not present

## 2016-06-08 LAB — CBC
HCT: 40 % (ref 36.0–46.0)
HEMOGLOBIN: 13.5 g/dL (ref 12.0–15.0)
MCHC: 33.7 g/dL (ref 30.0–36.0)
MCV: 87.2 fl (ref 78.0–100.0)
PLATELETS: 243 10*3/uL (ref 150.0–400.0)
RBC: 4.58 Mil/uL (ref 3.87–5.11)
RDW: 13.8 % (ref 11.5–15.5)
WBC: 8 10*3/uL (ref 4.0–10.5)

## 2016-06-08 LAB — LIPID PANEL
CHOLESTEROL: 198 mg/dL (ref 0–200)
HDL: 50.8 mg/dL (ref >39.00)
LDL CALC: 123 mg/dL — AB (ref 0–99)
NonHDL: 146.71
Total CHOL/HDL Ratio: 4
Triglycerides: 119 mg/dL (ref 0.0–149.0)
VLDL: 23.8 mg/dL (ref 0.0–40.0)

## 2016-06-08 LAB — COMPREHENSIVE METABOLIC PANEL
ALBUMIN: 4.4 g/dL (ref 3.5–5.2)
ALK PHOS: 56 U/L (ref 39–117)
ALT: 30 U/L (ref 0–35)
AST: 20 U/L (ref 0–37)
BILIRUBIN TOTAL: 0.4 mg/dL (ref 0.2–1.2)
BUN: 18 mg/dL (ref 6–23)
CHLORIDE: 103 meq/L (ref 96–112)
CO2: 27 mEq/L (ref 19–32)
CREATININE: 0.72 mg/dL (ref 0.40–1.20)
Calcium: 9.3 mg/dL (ref 8.4–10.5)
GFR: 85.3 mL/min (ref >60.00)
Glucose, Bld: 102 mg/dL — ABNORMAL HIGH (ref 70–99)
Potassium: 4.3 mEq/L (ref 3.5–5.1)
SODIUM: 138 meq/L (ref 135–145)
TOTAL PROTEIN: 7.4 g/dL (ref 6.0–8.3)

## 2016-06-08 LAB — HEMOGLOBIN A1C: Hgb A1c MFr Bld: 6.3 % (ref 4.6–6.5)

## 2016-06-08 MED ORDER — ESTROGENS, CONJUGATED 0.625 MG/GM VA CREA: 1.0000 | TOPICAL_CREAM | Freq: Every day | VAGINAL | 12 refills | Status: DC

## 2016-06-08 MED ORDER — TRAMADOL HCL 50 MG PO TABS: 100.0000 mg | ORAL_TABLET | Freq: Three times a day (TID) | ORAL | 0 refills | Status: DC | PRN

## 2016-06-08 NOTE — Progress Notes (Signed)
Subjective:  Patient ID: Amber Perez, female    DOB: 07/05/1945  Age: 71 y.o. MRN: [id number]  CC: Follow up  HPI:  71 year old female with HTN, HLD, Obesity, OA, Osteopenia, atrophic vaginitis presents for follow up.    Atrophic vaginitis  Stable but had a better response to Premarin. Would like to switch today.  HTN  Stable on amlodipine and HCTZ.  HLD  Currently untreated.  In need of lipid panel today.  OA - R knee  Stable on Tramadol.  In need of refill.  Getting knee replacement this month.  Social Hx   Social History   Social History  . Marital status: Divorced    Spouse name: N/A  . Number of children: 2  . Years of education: N/A   Occupational History  . 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 Smoking status: Former Smoker    Quit date: 03/18/2006  . Smokeless tobacco: Never Used  . Alcohol use Yes     Comment: Occasional  . Drug use: No  . Sexual activity: Not Asked   Other Topics Concern  . None   Social History Narrative   Regular Exercise -  Occasional   Daily Caffeine Use:  2 cups coffee & Diet Mt Dew    Review of Systems  Constitutional: Negative.   Musculoskeletal:       Knee pain.   Objective:  BP 133/85   Pulse 83   Temp 97.7 F (36.5 C) (Oral)   Wt 179 lb 3.2 oz (81.3 kg)   SpO2 96%   BMI 35.89 kg/m   BP/Weight 06/08/2016 07/12/2015 [id number]  Systolic BP Q000111Q Q000111Q Q000111Q  Diastolic BP 85 83 81  Wt. (Lbs) 179.2 176.25 178.5  BMI 35.89 35.3 35.14    Physical Exam  Constitutional: She is oriented to person, place, and time. She appears well-developed. No distress.  Cardiovascular: Normal rate and regular rhythm.   Pulmonary/Chest: Effort normal and breath sounds normal.  Neurological: She is alert and oriented to person, place, and time.  Psychiatric: She has a normal mood and affect.  Vitals reviewed.   Lab Results  Component Value Date   WBC 5.9 07/12/2015   HGB 13.4  07/12/2015   HCT 40.1 07/12/2015   PLT 245 07/12/2015   GLUCOSE 79 07/12/2015   CHOL 208 (H) 07/12/2015   TRIG 128 07/12/2015   HDL 58 07/12/2015   LDLDIRECT 127.1 11/25/2011   LDLCALC 124 07/12/2015   ALT 22 07/12/2015   AST 20 07/12/2015   NA 135 07/12/2015   K 4.4 07/12/2015   CL 102 07/12/2015   CREATININE 0.63 07/12/2015   BUN 16 07/12/2015   CO2 23 07/12/2015   TSH 1.68 06/08/2014   HGBA1C 6.1 06/08/2014   MICROALBUR 0.5 07/12/2015    Assessment & Plan:   Problem List Items Addressed This Visit    Atrophic vaginitis - Primary    Switching to Premarin per patient request.      Essential hypertension    Stable. Continue amlodipine and HCTZ.      Relevant Orders   Comprehensive metabolic panel   Hyperlipidemia    Lipid panel today. We'll assess need for medication based on results.      Relevant Orders   Lipid panel   Osteoarthritis of right knee    Tramadol refilled today. Has upcoming knee replacement.      Relevant Medications   traMADol (ULTRAM) 50  MG tablet    Other Visit Diagnoses    Screening for deficiency anemia       Relevant Orders   CBC   Abnormal mammogram       Relevant Orders   MM Digital Diagnostic Bilat   Blood glucose elevated       Relevant Orders   Hemoglobin A1c      Meds ordered this encounter  Medications  . traMADol (ULTRAM) 50 MG tablet    Sig: Take 2 tablets (100 mg total) by mouth every 8 (eight) hours as needed for severe pain.    Dispense:  90 tablet    Refill:  0  . conjugated estrogens (PREMARIN) vaginal cream    Sig: Place 1 Applicatorful vaginally daily.    Dispense:  42.5 g    Refill:  12    Follow-up: 6 months to 1 year  [place]

## 2016-06-08 NOTE — Assessment & Plan Note (Signed)
Stable. Continue amlodipine and HCTZ.

## 2016-06-08 NOTE — Progress Notes (Signed)
Pre visit review using our clinic review tool, if applicable. No additional management support is needed unless otherwise documented below in the visit note. 

## 2016-06-08 NOTE — Assessment & Plan Note (Signed)
Switching to Premarin per patient request.

## 2016-06-08 NOTE — Assessment & Plan Note (Signed)
Tramadol refilled today. Has upcoming knee replacement.

## 2016-06-08 NOTE — Assessment & Plan Note (Signed)
Lipid panel today. We'll assess need for medication based on results.

## 2016-06-08 NOTE — Patient Instructions (Signed)
Follow up in 6 months to 1 year.  We will call with your lab results.  I have changed to Premarin.  I have refilled your tramadol.  We will put in an order for your mammogram. You need it in May.  Take care  Dr. Lacinda Axon

## 2016-06-10 ENCOUNTER — Telehealth: Payer: Self-pay | Admitting: Family Medicine

## 2016-06-10 ENCOUNTER — Encounter: Payer: Self-pay | Admitting: Family Medicine

## 2016-06-10 NOTE — Telephone Encounter (Signed)
I called pt and left a vm regarding the Mychart request. I advised pt to check with her insurance first to make sure that the  Hepatitis C Screening  Tetanus/Tdap   Will be covered first before we schedule the appt. Thank you!

## 2016-06-17 ENCOUNTER — Encounter
Admission: RE | Admit: 2016-06-17 | Discharge: 2016-06-17 | Disposition: A | Discharge status: Home / Self Care | Payer: Medicare HMO | Source: Ambulatory Visit | Attending: Orthopedic Surgery | Admitting: Orthopedic Surgery

## 2016-06-17 DIAGNOSIS — Z01810 Encounter for preprocedural cardiovascular examination: Secondary | ICD-10-CM | POA: Insufficient documentation

## 2016-06-17 DIAGNOSIS — Z87891 Personal history of nicotine dependence: Secondary | ICD-10-CM | POA: Insufficient documentation

## 2016-06-17 DIAGNOSIS — M1711 Unilateral primary osteoarthritis, right knee: Secondary | ICD-10-CM | POA: Diagnosis not present

## 2016-06-17 DIAGNOSIS — Z01812 Encounter for preprocedural laboratory examination: Secondary | ICD-10-CM | POA: Insufficient documentation

## 2016-06-17 DIAGNOSIS — E785 Hyperlipidemia, unspecified: Secondary | ICD-10-CM | POA: Diagnosis not present

## 2016-06-17 DIAGNOSIS — I10 Essential (primary) hypertension: Secondary | ICD-10-CM | POA: Diagnosis not present

## 2016-06-17 HISTORY — DX: Prediabetes: R73.03

## 2016-06-17 HISTORY — DX: Pneumonia, unspecified organism: J18.9

## 2016-06-17 LAB — COMPREHENSIVE METABOLIC PANEL
ALT: 30 U/L (ref 14–54)
ANION GAP: 7 (ref 5–15)
AST: 28 U/L (ref 15–41)
Albumin: 4.4 g/dL (ref 3.5–5.0)
Alkaline Phosphatase: 60 U/L (ref 38–126)
BUN: 22 mg/dL — ABNORMAL HIGH (ref 6–20)
CHLORIDE: 104 mmol/L (ref 101–111)
CO2: 26 mmol/L (ref 22–32)
CREATININE: 0.64 mg/dL (ref 0.44–1.00)
Calcium: 9.5 mg/dL (ref 8.9–10.3)
Glucose, Bld: 111 mg/dL — ABNORMAL HIGH (ref 65–99)
POTASSIUM: 3.6 mmol/L (ref 3.5–5.1)
SODIUM: 137 mmol/L (ref 135–145)
Total Bilirubin: 0.3 mg/dL (ref 0.3–1.2)
Total Protein: 8.2 g/dL — ABNORMAL HIGH (ref 6.5–8.1)

## 2016-06-17 LAB — URINALYSIS, ROUTINE W REFLEX MICROSCOPIC
Bilirubin Urine: NEGATIVE
GLUCOSE, UA: NEGATIVE mg/dL
HGB URINE DIPSTICK: NEGATIVE
KETONES UR: 5 mg/dL — AB
LEUKOCYTES UA: NEGATIVE
Nitrite: NEGATIVE
PH: 5 (ref 5.0–8.0)
PROTEIN: NEGATIVE mg/dL
Specific Gravity, Urine: 1.023 (ref 1.005–1.030)

## 2016-06-17 LAB — CBC
HCT: 41.8 % (ref 35.0–47.0)
Hemoglobin: 14.2 g/dL (ref 12.0–16.0)
MCH: 29.5 pg (ref 26.0–34.0)
MCHC: 34 g/dL (ref 32.0–36.0)
MCV: 86.6 fL (ref 80.0–100.0)
PLATELETS: 260 10*3/uL (ref 150–440)
RBC: 4.82 MIL/uL (ref 3.80–5.20)
RDW: 14.7 % — AB (ref 11.5–14.5)
WBC: 9.7 10*3/uL (ref 3.6–11.0)

## 2016-06-17 LAB — SEDIMENTATION RATE: SED RATE: 7 mm/h (ref 0–30)

## 2016-06-17 LAB — PROTIME-INR
INR: 0.91
PROTHROMBIN TIME: 12.2 s (ref 11.4–15.2)

## 2016-06-17 LAB — TYPE AND SCREEN
ABO/RH(D): O NEG
ANTIBODY SCREEN: NEGATIVE

## 2016-06-17 LAB — C-REACTIVE PROTEIN: CRP: <0.8 mg/dL (ref <1.0)

## 2016-06-17 LAB — APTT: aPTT: 30 seconds (ref 24–36)

## 2016-06-17 LAB — SURGICAL PCR SCREEN
MRSA, PCR: NEGATIVE
STAPHYLOCOCCUS AUREUS: POSITIVE — AB

## 2016-06-17 NOTE — Pre-Procedure Instructions (Signed)
S.Fields RN reviewed today's EKG compared to one done on 03/24/12.  OK to proceed.

## 2016-06-17 NOTE — Patient Instructions (Signed)
  Your procedure is scheduled on: Monday June 29, 2016. Report to Same Day Surgery. To find out your arrival time please call [phone] between 1PM - 3PM on Friday June 26, 2016.  Remember: Instructions that are not followed completely may result in serious medical risk, up to and including death, or upon the discretion of your surgeon and anesthesiologist your surgery may need to be rescheduled.    _x___ 1. Do not eat food or drink liquids after midnight. No gum chewing or hard candies.     _x___ 2. No Alcohol for 24 hours before or after surgery.   ____ 3. Bring all medications with you on the day of surgery if instructed.    __x__ 4. Notify your doctor if there is any change in your medical condition     (cold, fever, infections).    _____ 5. No smoking 24 hours prior to surgery.     Do not wear jewelry, make-up, hairpins, clips or nail polish.  Do not wear lotions, powders, or perfumes.   Do not shave 48 hours prior to surgery. Men may shave face and neck.  Do not bring valuables to the hospital.    [facility] is not responsible for any belongings or valuables.               Contacts, dentures or bridgework may not be worn into surgery.  Leave your suitcase in the car. After surgery it may be brought to your room.  For patients admitted to the hospital, discharge time is determined by your treatment team.   Patients discharged the day of surgery will not be allowed to drive home.    Please read over the following fact sheets that you were given:   [facility] Preparing for Surgery  _x___ Take these medicines the morning of surgery with A SIP OF WATER:    1. pantoprazole (PROTONIX), also take an extra dose at bedtime the night before surgery.    ____ Fleet Enema (as directed)   _x___ Use CHG Soap as directed on instruction sheet  ____ Use inhalers on the day of surgery and bring to hospital day of surgery  ____ Stop metformin 2 days prior to surgery    ____  Take 1/2 of usual insulin dose the night before surgery and none on the morning of  surgery.   ____ Stop Coumadin/Plavix/aspirin on does not apply.  _x___ Stop Anti-inflammatories such as Advil, Aleve, Ibuprofen, Motrin, Naproxen, Naprosyn, Goodies powders or aspirin products. OK to take traMADol  (ULTRAM) or Tylenol.   _x___ Stop supplements:Biotin w/ Vitamins C & E (HAIR/SKIN/NAILS PO), COLLAGEN,Omega-3 Fatty Acids (FISH OIL ULTRA),TURMERIC     until after  surgery.    ____ Bring C-Pap to the hospital.

## 2016-06-17 NOTE — Progress Notes (Signed)
Pt reports she has problems sleeping due to pain in the knee, possible restless leg syndrome.

## 2016-06-18 LAB — URINE CULTURE
Culture: <10000 — AB
SPECIAL REQUESTS: NORMAL

## 2016-06-26 NOTE — Pre-Procedure Instructions (Signed)
Faxed request to Dr. Clydell Hakim for H&P.

## 2016-06-28 MED ORDER — SODIUM CHLORIDE 0.9 % IV SOLN
1000.0000 mg | INTRAVENOUS | Status: DC
  Filled 2016-06-28: qty 10

## 2016-06-28 MED ORDER — CLINDAMYCIN PHOSPHATE 900 MG/50ML IV SOLN: 900.0000 mg | INTRAVENOUS | Status: DC

## 2016-06-29 ENCOUNTER — Inpatient Hospital Stay: Payer: Medicare HMO | Admitting: Anesthesiology

## 2016-06-29 ENCOUNTER — Inpatient Hospital Stay
Admission: RE | Admit: 2016-06-29 | Discharge: 2016-07-01 | DRG: 470 | Disposition: A | Discharge status: Home Health | Payer: Medicare HMO | Source: Ambulatory Visit | Attending: Orthopedic Surgery | Admitting: Orthopedic Surgery

## 2016-06-29 ENCOUNTER — Inpatient Hospital Stay: Payer: Medicare HMO

## 2016-06-29 ENCOUNTER — Encounter: Payer: Self-pay | Admitting: Orthopedic Surgery

## 2016-06-29 ENCOUNTER — Encounter
Admission: RE | Disposition: A | Discharge status: Home Health | Payer: Self-pay | Source: Ambulatory Visit | Attending: Orthopedic Surgery

## 2016-06-29 DIAGNOSIS — Z8701 Personal history of pneumonia (recurrent): Secondary | ICD-10-CM

## 2016-06-29 DIAGNOSIS — M1711 Unilateral primary osteoarthritis, right knee: Secondary | ICD-10-CM | POA: Diagnosis not present

## 2016-06-29 DIAGNOSIS — M810 Age-related osteoporosis without current pathological fracture: Secondary | ICD-10-CM | POA: Diagnosis present

## 2016-06-29 DIAGNOSIS — Z96659 Presence of unspecified artificial knee joint: Secondary | ICD-10-CM | POA: Diagnosis not present

## 2016-06-29 DIAGNOSIS — I10 Essential (primary) hypertension: Secondary | ICD-10-CM | POA: Diagnosis present

## 2016-06-29 DIAGNOSIS — Z880 Allergy status to penicillin: Secondary | ICD-10-CM | POA: Diagnosis not present

## 2016-06-29 DIAGNOSIS — Z888 Allergy status to other drugs, medicaments and biological substances status: Secondary | ICD-10-CM

## 2016-06-29 DIAGNOSIS — J449 Chronic obstructive pulmonary disease, unspecified: Secondary | ICD-10-CM | POA: Diagnosis present

## 2016-06-29 DIAGNOSIS — K219 Gastro-esophageal reflux disease without esophagitis: Secondary | ICD-10-CM | POA: Diagnosis present

## 2016-06-29 DIAGNOSIS — Z471 Aftercare following joint replacement surgery: Secondary | ICD-10-CM | POA: Diagnosis not present

## 2016-06-29 DIAGNOSIS — Z96651 Presence of right artificial knee joint: Secondary | ICD-10-CM | POA: Diagnosis not present

## 2016-06-29 DIAGNOSIS — E785 Hyperlipidemia, unspecified: Secondary | ICD-10-CM | POA: Diagnosis present

## 2016-06-29 HISTORY — PX: KNEE ARTHROPLASTY: SHX992

## 2016-06-29 LAB — ABO/RH: ABO/RH(D): O NEG

## 2016-06-29 SURGERY — ARTHROPLASTY, KNEE, TOTAL, USING IMAGELESS COMPUTER-ASSISTED NAVIGATION
Anesthesia: Spinal | Site: Knee | Laterality: Right | Wound class: Clean

## 2016-06-29 MED ORDER — SODIUM CHLORIDE 0.9 % IJ SOLN
INTRAMUSCULAR | Status: AC
  Filled 2016-06-29: qty 50

## 2016-06-29 MED ORDER — MENTHOL 3 MG MT LOZG
1.0000 | LOZENGE | OROMUCOSAL | Status: DC | PRN
  Administered 2016-06-30: 3 mg via ORAL
  Filled 2016-06-29 (×2): qty 9

## 2016-06-29 MED ORDER — FLEET ENEMA 7-19 GM/118ML RE ENEM: 1.0000 | ENEMA | Freq: Once | RECTAL | Status: DC | PRN

## 2016-06-29 MED ORDER — DEXAMETHASONE SODIUM PHOSPHATE 4 MG/ML IJ SOLN
INTRAMUSCULAR | Status: DC | PRN
  Administered 2016-06-29: 10 mg via INTRAVENOUS

## 2016-06-29 MED ORDER — HYDROCHLOROTHIAZIDE 12.5 MG PO CAPS: 12.5000 mg | ORAL_CAPSULE | Freq: Every day | ORAL | Status: DC | PRN

## 2016-06-29 MED ORDER — PHENOL 1.4 % MT LIQD
1.0000 | OROMUCOSAL | Status: DC | PRN
  Filled 2016-06-29: qty 177

## 2016-06-29 MED ORDER — ALUM & MAG HYDROXIDE-SIMETH 200-200-20 MG/5ML PO SUSP: 30.0000 mL | ORAL | Status: DC | PRN

## 2016-06-29 MED ORDER — FENTANYL CITRATE (PF) 100 MCG/2ML IJ SOLN
INTRAMUSCULAR | Status: DC | PRN
  Administered 2016-06-29: 50 ug via INTRAVENOUS
  Administered 2016-06-29 (×2): 25 ug via INTRAVENOUS
  Administered 2016-06-29 (×2): 50 ug via INTRAVENOUS

## 2016-06-29 MED ORDER — PROPOFOL 500 MG/50ML IV EMUL
INTRAVENOUS | Status: DC | PRN
  Administered 2016-06-29: 60 ug/kg/min via INTRAVENOUS

## 2016-06-29 MED ORDER — ACETAMINOPHEN 10 MG/ML IV SOLN
1000.0000 mg | Freq: Four times a day (QID) | INTRAVENOUS | Status: AC
  Administered 2016-06-29 – 2016-06-30 (×4): 1000 mg via INTRAVENOUS
  Filled 2016-06-29 (×5): qty 100

## 2016-06-29 MED ORDER — AMLODIPINE BESYLATE 5 MG PO TABS
5.0000 mg | ORAL_TABLET | Freq: Every day | ORAL | Status: DC
  Administered 2016-06-29 – 2016-06-30 (×2): 5 mg via ORAL
  Filled 2016-06-29 (×2): qty 1

## 2016-06-29 MED ORDER — TETRACAINE HCL 1 % IJ SOLN
INTRAMUSCULAR | Status: DC | PRN
  Administered 2016-06-29: 4 mg via INTRASPINAL

## 2016-06-29 MED ORDER — ENOXAPARIN SODIUM 30 MG/0.3ML ~~LOC~~ SOLN
30.0000 mg | Freq: Two times a day (BID) | SUBCUTANEOUS | Status: DC
Start: 2016-06-30 — End: 2016-07-01
  Administered 2016-06-30 – 2016-07-01 (×3): 30 mg via SUBCUTANEOUS
  Filled 2016-06-29 (×3): qty 0.3

## 2016-06-29 MED ORDER — FERROUS SULFATE 325 (65 FE) MG PO TABS
325.0000 mg | ORAL_TABLET | Freq: Two times a day (BID) | ORAL | Status: DC
  Administered 2016-06-30 – 2016-07-01 (×3): 325 mg via ORAL
  Filled 2016-06-29 (×3): qty 1

## 2016-06-29 MED ORDER — CELECOXIB 200 MG PO CAPS
200.0000 mg | ORAL_CAPSULE | Freq: Two times a day (BID) | ORAL | Status: DC
  Administered 2016-06-29 – 2016-06-30 (×4): 200 mg via ORAL
  Filled 2016-06-29 (×3): qty 1

## 2016-06-29 MED ORDER — BUPIVACAINE HCL (PF) 0.25 % IJ SOLN
INTRAMUSCULAR | Status: DC | PRN
  Administered 2016-06-29: 60 mL

## 2016-06-29 MED ORDER — BUPIVACAINE LIPOSOME 1.3 % IJ SUSP
INTRAMUSCULAR | Status: DC | PRN
  Administered 2016-06-29: 60 mL

## 2016-06-29 MED ORDER — NEOMYCIN-POLYMYXIN B GU 40-200000 IR SOLN
Status: DC | PRN
  Administered 2016-06-29: 16 mL

## 2016-06-29 MED ORDER — PROPOFOL 500 MG/50ML IV EMUL
INTRAVENOUS | Status: AC
  Filled 2016-06-29: qty 50

## 2016-06-29 MED ORDER — MAGNESIUM HYDROXIDE 400 MG/5ML PO SUSP
30.0000 mL | Freq: Every day | ORAL | Status: DC | PRN
  Administered 2016-07-01: 30 mL via ORAL
  Filled 2016-06-29: qty 30

## 2016-06-29 MED ORDER — METOCLOPRAMIDE HCL 10 MG PO TABS
10.0000 mg | ORAL_TABLET | Freq: Three times a day (TID) | ORAL | Status: DC
  Administered 2016-06-29 – 2016-07-01 (×6): 10 mg via ORAL
  Filled 2016-06-29 (×7): qty 1

## 2016-06-29 MED ORDER — SODIUM CHLORIDE 0.9 % IV SOLN
INTRAVENOUS | Status: DC
  Administered 2016-06-29: 18:00:00 via INTRAVENOUS

## 2016-06-29 MED ORDER — CALCIUM CARBONATE ANTACID 500 MG PO CHEW
400.0000 mg | CHEWABLE_TABLET | Freq: Every day | ORAL | Status: DC
  Administered 2016-06-29 – 2016-06-30 (×2): 400 mg via ORAL
  Filled 2016-06-29 (×2): qty 2

## 2016-06-29 MED ORDER — MORPHINE SULFATE (PF) 2 MG/ML IV SOLN
2.0000 mg | INTRAVENOUS | Status: DC | PRN
  Administered 2016-06-29 – 2016-06-30 (×4): 2 mg via INTRAVENOUS
  Filled 2016-06-29 (×4): qty 1

## 2016-06-29 MED ORDER — MAGNESIUM OXIDE 400 (241.3 MG) MG PO TABS
400.0000 mg | ORAL_TABLET | Freq: Every day | ORAL | Status: DC
  Administered 2016-06-29 – 2016-06-30 (×2): 400 mg via ORAL
  Filled 2016-06-29 (×2): qty 1

## 2016-06-29 MED ORDER — OMEGA-3-ACID ETHYL ESTERS 1 G PO CAPS
1000.0000 mg | ORAL_CAPSULE | Freq: Every day | ORAL | Status: DC
  Administered 2016-06-29 – 2016-06-30 (×2): 1000 mg via ORAL
  Filled 2016-06-29 (×2): qty 1

## 2016-06-29 MED ORDER — ACETAMINOPHEN 650 MG RE SUPP: 650.0000 mg | Freq: Four times a day (QID) | RECTAL | Status: DC | PRN

## 2016-06-29 MED ORDER — ESTRADIOL 0.1 MG/GM VA CREA: 1.0000 | TOPICAL_CREAM | VAGINAL | Status: DC

## 2016-06-29 MED ORDER — PHENYLEPHRINE HCL 10 MG/ML IJ SOLN
INTRAMUSCULAR | Status: AC
  Filled 2016-06-29: qty 1

## 2016-06-29 MED ORDER — ACETAMINOPHEN 325 MG PO TABS: 650.0000 mg | ORAL_TABLET | Freq: Four times a day (QID) | ORAL | Status: DC | PRN

## 2016-06-29 MED ORDER — BUPIVACAINE LIPOSOME 1.3 % IJ SUSP
INTRAMUSCULAR | Status: AC
  Filled 2016-06-29: qty 20

## 2016-06-29 MED ORDER — PANTOPRAZOLE SODIUM 40 MG PO TBEC
40.0000 mg | DELAYED_RELEASE_TABLET | Freq: Two times a day (BID) | ORAL | Status: DC
  Administered 2016-06-29 – 2016-06-30 (×3): 40 mg via ORAL
  Filled 2016-06-29 (×3): qty 1

## 2016-06-29 MED ORDER — LACTATED RINGERS IV SOLN
INTRAVENOUS | Status: DC
  Administered 2016-06-29 (×2): via INTRAVENOUS

## 2016-06-29 MED ORDER — FENTANYL CITRATE (PF) 100 MCG/2ML IJ SOLN
INTRAMUSCULAR | Status: AC
  Filled 2016-06-29: qty 2

## 2016-06-29 MED ORDER — CLINDAMYCIN PHOSPHATE 900 MG/50ML IV SOLN
INTRAVENOUS | Status: DC | PRN
  Administered 2016-06-29: 900 mg via INTRAVENOUS

## 2016-06-29 MED ORDER — VITAMIN D 1000 UNITS PO TABS
2500.0000 [IU] | ORAL_TABLET | Freq: Every day | ORAL | Status: DC
  Administered 2016-06-29 – 2016-06-30 (×2): 2500 [IU] via ORAL
  Filled 2016-06-29 (×2): qty 3

## 2016-06-29 MED ORDER — CLINDAMYCIN PHOSPHATE 600 MG/50ML IV SOLN
600.0000 mg | Freq: Four times a day (QID) | INTRAVENOUS | Status: AC
  Administered 2016-06-29 – 2016-06-30 (×4): 600 mg via INTRAVENOUS
  Filled 2016-06-29 (×4): qty 50

## 2016-06-29 MED ORDER — BUPIVACAINE IN DEXTROSE 0.75-8.25 % IT SOLN
INTRATHECAL | Status: DC | PRN
Start: 2016-06-29 — End: 2016-06-29
  Administered 2016-06-29: 1.6 mL via INTRATHECAL

## 2016-06-29 MED ORDER — ACETAMINOPHEN 10 MG/ML IV SOLN
INTRAVENOUS | Status: AC
  Filled 2016-06-29: qty 100

## 2016-06-29 MED ORDER — ONDANSETRON HCL 4 MG PO TABS: 4.0000 mg | ORAL_TABLET | Freq: Four times a day (QID) | ORAL | Status: DC | PRN

## 2016-06-29 MED ORDER — FLUTICASONE PROPIONATE 50 MCG/ACT NA SUSP
2.0000 | Freq: Every day | NASAL | Status: DC | PRN
  Filled 2016-06-29: qty 16

## 2016-06-29 MED ORDER — BISACODYL 10 MG RE SUPP: 10.0000 mg | Freq: Every day | RECTAL | Status: DC | PRN

## 2016-06-29 MED ORDER — GLYCOPYRROLATE 0.2 MG/ML IJ SOLN
INTRAMUSCULAR | Status: DC | PRN
  Administered 2016-06-29: 0.2 mg via INTRAVENOUS

## 2016-06-29 MED ORDER — MIDAZOLAM HCL 2 MG/2ML IJ SOLN
INTRAMUSCULAR | Status: AC
  Filled 2016-06-29: qty 2

## 2016-06-29 MED ORDER — CLINDAMYCIN PHOSPHATE 900 MG/50ML IV SOLN
INTRAVENOUS | Status: AC
  Filled 2016-06-29: qty 50

## 2016-06-29 MED ORDER — ONDANSETRON HCL 4 MG/2ML IJ SOLN: 4.0000 mg | Freq: Once | INTRAMUSCULAR | Status: DC | PRN

## 2016-06-29 MED ORDER — GLYCOPYRROLATE 0.2 MG/ML IJ SOLN
INTRAMUSCULAR | Status: AC
  Filled 2016-06-29: qty 1

## 2016-06-29 MED ORDER — DIPHENHYDRAMINE HCL 12.5 MG/5ML PO ELIX
12.5000 mg | ORAL_SOLUTION | ORAL | Status: DC | PRN
Start: 2016-06-29 — End: 2016-07-01

## 2016-06-29 MED ORDER — BUPIVACAINE HCL (PF) 0.25 % IJ SOLN
INTRAMUSCULAR | Status: AC
  Filled 2016-06-29: qty 60

## 2016-06-29 MED ORDER — CALCIUM-MAGNESIUM 500-250 MG PO TABS: 2.0000 | ORAL_TABLET | Freq: Every day | ORAL | Status: DC

## 2016-06-29 MED ORDER — DEXAMETHASONE SODIUM PHOSPHATE 10 MG/ML IJ SOLN
INTRAMUSCULAR | Status: AC
  Filled 2016-06-29: qty 1

## 2016-06-29 MED ORDER — TRIAMCINOLONE ACETONIDE 55 MCG/ACT NA AERO: 2.0000 | INHALATION_SPRAY | NASAL | Status: DC | PRN

## 2016-06-29 MED ORDER — ONDANSETRON HCL 4 MG/2ML IJ SOLN
4.0000 mg | Freq: Four times a day (QID) | INTRAMUSCULAR | Status: DC | PRN
  Administered 2016-06-29: 4 mg via INTRAVENOUS
  Filled 2016-06-29: qty 2

## 2016-06-29 MED ORDER — SODIUM CHLORIDE 0.9 % IV SOLN
INTRAVENOUS | Status: DC | PRN
  Administered 2016-06-29: 30 ug/min via INTRAVENOUS

## 2016-06-29 MED ORDER — NEOMYCIN-POLYMYXIN B GU 40-200000 IR SOLN
Status: AC
  Filled 2016-06-29: qty 20

## 2016-06-29 MED ORDER — TRANEXAMIC ACID 1000 MG/10ML IV SOLN
INTRAVENOUS | Status: DC | PRN
  Administered 2016-06-29: 1000 mg via INTRAVENOUS

## 2016-06-29 MED ORDER — SENNOSIDES-DOCUSATE SODIUM 8.6-50 MG PO TABS
1.0000 | ORAL_TABLET | Freq: Two times a day (BID) | ORAL | Status: DC
  Administered 2016-06-29 – 2016-06-30 (×3): 1 via ORAL
  Filled 2016-06-29 (×3): qty 1

## 2016-06-29 MED ORDER — LORATADINE 10 MG PO TABS
10.0000 mg | ORAL_TABLET | Freq: Every day | ORAL | Status: DC
  Administered 2016-06-30: 10 mg via ORAL
  Filled 2016-06-29: qty 1

## 2016-06-29 MED ORDER — ACETAMINOPHEN 10 MG/ML IV SOLN
INTRAVENOUS | Status: DC | PRN
  Administered 2016-06-29: 1000 mg via INTRAVENOUS

## 2016-06-29 MED ORDER — PROPOFOL 10 MG/ML IV BOLUS
INTRAVENOUS | Status: DC | PRN
  Administered 2016-06-29: 16 mg via INTRAVENOUS
  Administered 2016-06-29 (×2): 8 mg via INTRAVENOUS

## 2016-06-29 MED ORDER — OXYCODONE HCL 5 MG PO TABS
5.0000 mg | ORAL_TABLET | ORAL | Status: DC | PRN
  Administered 2016-06-29 – 2016-07-01 (×9): 10 mg via ORAL
  Filled 2016-06-29 (×9): qty 2

## 2016-06-29 MED ORDER — CHLORHEXIDINE GLUCONATE 4 % EX LIQD: 60.0000 mL | Freq: Once | CUTANEOUS | Status: DC

## 2016-06-29 MED ORDER — MIDAZOLAM HCL 5 MG/5ML IJ SOLN
INTRAMUSCULAR | Status: DC | PRN
  Administered 2016-06-29: 2 mg via INTRAVENOUS

## 2016-06-29 MED ORDER — HAIR/SKIN/NAILS PO CAPS: 2.0000 | ORAL_CAPSULE | Freq: Every day | ORAL | Status: DC

## 2016-06-29 MED ORDER — TRAMADOL HCL 50 MG PO TABS
50.0000 mg | ORAL_TABLET | ORAL | Status: DC | PRN
  Administered 2016-06-29 – 2016-06-30 (×4): 100 mg via ORAL
  Filled 2016-06-29 (×4): qty 2

## 2016-06-29 MED ORDER — SODIUM CHLORIDE 0.9 % IV SOLN
1000.0000 mg | Freq: Once | INTRAVENOUS | Status: AC
  Administered 2016-06-29: 1000 mg via INTRAVENOUS
  Filled 2016-06-29: qty 10

## 2016-06-29 MED ORDER — FENTANYL CITRATE (PF) 100 MCG/2ML IJ SOLN: 25.0000 ug | INTRAMUSCULAR | Status: DC | PRN

## 2016-06-29 SURGICAL SUPPLY — 64 items
BATTERY INSTRU NAVIGATION (MISCELLANEOUS) ×12 IMPLANT
BLADE SAW 1 (BLADE) ×3 IMPLANT
BLADE SAW 1/2 (BLADE) ×3 IMPLANT
BLADE SAW 70X12.5 (BLADE) IMPLANT
BTRY SRG DRVR LF (MISCELLANEOUS) ×4
CANISTER SUCT 1200ML W/VALVE (MISCELLANEOUS) ×3 IMPLANT
CANISTER SUCT 3000ML (MISCELLANEOUS) ×6 IMPLANT
CAPT KNEE TOTAL 3 ATTUNE ×2 IMPLANT
CATH TRAY METER 16FR LF (MISCELLANEOUS) ×3 IMPLANT
CEMENT HV SMART SET (Cement) ×6 IMPLANT
COOLER POLAR GLACIER W/PUMP (MISCELLANEOUS) ×3 IMPLANT
CUFF TOURN 24 STER (MISCELLANEOUS) IMPLANT
CUFF TOURN 30 STER DUAL PORT (MISCELLANEOUS) ×2 IMPLANT
DRAPE SHEET LG 3/4 BI-LAMINATE (DRAPES) ×3 IMPLANT
DRSG DERMACEA 8X12 NADH (GAUZE/BANDAGES/DRESSINGS) ×3 IMPLANT
DRSG OPSITE POSTOP 4X14 (GAUZE/BANDAGES/DRESSINGS) ×3 IMPLANT
DRSG TEGADERM 4X4.75 (GAUZE/BANDAGES/DRESSINGS) ×3 IMPLANT
DURAPREP 26ML APPLICATOR (WOUND CARE) ×6 IMPLANT
ELECT CAUTERY BLADE 6.4 (BLADE) ×3 IMPLANT
ELECT REM PT RETURN 9FT ADLT (ELECTROSURGICAL) ×3
ELECTRODE REM PT RTRN 9FT ADLT (ELECTROSURGICAL) ×1 IMPLANT
EX-PIN ORTHOLOCK NAV 4X150 (PIN) ×6 IMPLANT
GLOVE BIOGEL M STRL SZ7.5 (GLOVE) ×6 IMPLANT
GLOVE BIOGEL PI IND STRL 9 (GLOVE) ×1 IMPLANT
GLOVE BIOGEL PI INDICATOR 9 (GLOVE) ×2
GLOVE INDICATOR 8.0 STRL GRN (GLOVE) ×3 IMPLANT
GLOVE SURG SYN 9.0  PF PI (GLOVE) ×2
GLOVE SURG SYN 9.0 PF PI (GLOVE) ×1 IMPLANT
GOWN STRL REUS W/ TWL LRG LVL3 (GOWN DISPOSABLE) ×2 IMPLANT
GOWN STRL REUS W/TWL 2XL LVL3 (GOWN DISPOSABLE) ×3 IMPLANT
GOWN STRL REUS W/TWL LRG LVL3 (GOWN DISPOSABLE) ×6
HANDPIECE INTERPULSE COAX TIP (DISPOSABLE) ×3
HEMOVAC 400CC 10FR (MISCELLANEOUS) ×3 IMPLANT
HOLDER FOLEY CATH W/STRAP (MISCELLANEOUS) ×3 IMPLANT
HOOD PEEL AWAY FLYTE STAYCOOL (MISCELLANEOUS) ×6 IMPLANT
KIT RM TURNOVER STRD PROC AR (KITS) ×3 IMPLANT
KNIFE SCULPS 14X20 (INSTRUMENTS) ×3 IMPLANT
LABEL OR SOLS (LABEL) ×3 IMPLANT
NDL SAFETY 18GX1.5 (NEEDLE) ×3 IMPLANT
NDL SPNL 20GX3.5 QUINCKE YW (NEEDLE) ×1 IMPLANT
NEEDLE SPNL 20GX3.5 QUINCKE YW (NEEDLE) ×3 IMPLANT
NS IRRIG 500ML POUR BTL (IV SOLUTION) ×3 IMPLANT
PACK TOTAL KNEE (MISCELLANEOUS) ×3 IMPLANT
PAD WRAPON POLAR KNEE (MISCELLANEOUS) ×1 IMPLANT
PIN FIXATION 1/8DIA X 3INL (PIN) ×3 IMPLANT
SET HNDPC FAN SPRY TIP SCT (DISPOSABLE) ×1 IMPLANT
SOL .9 NS 3000ML IRR  AL (IV SOLUTION) ×2
SOL .9 NS 3000ML IRR AL (IV SOLUTION) ×1
SOL .9 NS 3000ML IRR UROMATIC (IV SOLUTION) ×1 IMPLANT
SOL PREP PVP 2OZ (MISCELLANEOUS) ×3
SOLUTION PREP PVP 2OZ (MISCELLANEOUS) ×1 IMPLANT
SPONGE DRAIN TRACH 4X4 STRL 2S (GAUZE/BANDAGES/DRESSINGS) ×3 IMPLANT
STAPLER SKIN PROX 35W (STAPLE) ×3 IMPLANT
STRAP TIBIA SHORT (MISCELLANEOUS) ×3 IMPLANT
SUCTION FRAZIER HANDLE 10FR (MISCELLANEOUS) ×2
SUCTION TUBE FRAZIER 10FR DISP (MISCELLANEOUS) ×1 IMPLANT
SUT VIC AB 0 CT1 36 (SUTURE) ×3 IMPLANT
SUT VIC AB 1 CT1 36 (SUTURE) ×6 IMPLANT
SUT VIC AB 2-0 CT2 27 (SUTURE) ×3 IMPLANT
SYR 20CC LL (SYRINGE) ×3 IMPLANT
SYR 30ML LL (SYRINGE) ×6 IMPLANT
TOWEL OR 17X26 4PK STRL BLUE (TOWEL DISPOSABLE) ×3 IMPLANT
TOWER CARTRIDGE SMART MIX (DISPOSABLE) ×3 IMPLANT
WRAPON POLAR PAD KNEE (MISCELLANEOUS) ×3

## 2016-06-29 NOTE — H&P (Signed)
The patient has been re-examined, and the chart reviewed, and there have been no interval changes to the documented history and physical.    The risks, benefits, and alternatives have been discussed at length. The patient expressed understanding of the risks benefits and agreed with plans for surgical intervention.  Mehtab Dolberry P. Greyden Besecker, Jr. M.D.    

## 2016-06-29 NOTE — NC FL2 (Signed)
[place] LEVEL OF CARE SCREENING TOOL     IDENTIFICATION  Patient Name: Amber Perez Birthdate: 09-14-1945 Sex: female Admission Date (Current Location): 06/29/2016  [place] and [place] Number:  Engineering geologist and Address:  [facility], [address] [place],  [address]      Provider Number: [id number]  Attending Physician Name and Address:  [name], MD  Relative Name and Phone Number:       Current Level of Care: Hospital Recommended Level of Care: [place] Prior Approval Number:    Date Approved/Denied:   PASRR Number:  [id number])  Discharge Plan: SNF    Current Diagnoses: Patient Active Problem List   Diagnosis Date Noted  . S/P total knee arthroplasty 06/29/2016  . Essential hypertension 01/25/2014  . Obesity (BMI 30-39.9) 01/25/2014  . Family history of diabetes mellitus 06/20/2013  . Osteoarthritis of right knee 03/24/2012  . Atrophic vaginitis 03/24/2012  . Thyroid nodule 11/25/2011  . Osteopenia 11/25/2011  . Hyperlipidemia 11/25/2011  . GERD (gastroesophageal reflux disease) 03/19/2011  . Allergic rhinitis 03/19/2011    Orientation RESPIRATION BLADDER Height & Weight     Self, Time, Situation, Place  Normal Continent Weight: 179 lb (81.2 kg) Height:  4' 11.5" (151.1 cm)  BEHAVIORAL SYMPTOMS/MOOD NEUROLOGICAL BOWEL NUTRITION STATUS   (none)  (none) Continent Diet (Diet: Clear Liquid )  AMBULATORY STATUS COMMUNICATION OF NEEDS Skin   Extensive Assist Verbally Surgical wounds (Incision: Right Knee )                       Personal Care Assistance Level of Assistance  Bathing, Feeding, Dressing Bathing Assistance: Limited assistance Feeding assistance: Independent Dressing Assistance: Limited assistance     Functional Limitations Info  Sight, Hearing, Speech Sight Info: Adequate Hearing Info: Adequate Speech Info: Adequate    SPECIAL CARE FACTORS  FREQUENCY  PT (By licensed PT), OT (By licensed OT)     PT Frequency:  (5) OT Frequency:  (5)            Contractures      Additional Factors Info  Code Status, Allergies Code Status Info:  (Full Code. ) Allergies Info:  (Penicillins, Bisphosphonates)           Current Medications (06/29/2016):  This is the current hospital active medication list Current Facility-Administered Medications  Medication Dose Route Frequency Provider Last Rate Last Dose  . 0.9 %  sodium chloride infusion   Intravenous Continuous [name], MD      . acetaminophen (OFIRMEV) IV 1,000 mg  1,000 mg Intravenous Q6H [name], MD      . acetaminophen (TYLENOL) tablet 650 mg  650 mg Oral Q6H PRN [name], MD       Or  . acetaminophen (TYLENOL) suppository 650 mg  650 mg Rectal Q6H PRN [name], MD      . alum & mag hydroxide-simeth (MAALOX/MYLANTA) 200-200-20 MG/5ML suspension 30 mL  30 mL Oral Q4H PRN [name], MD      . amLODipine (NORVASC) tablet 5 mg  5 mg Oral QHS [name], MD      . bisacodyl (DULCOLAX) suppository 10 mg  10 mg Rectal Daily PRN [name], MD      . calcium carbonate (TUMS - dosed in mg elemental calcium) chewable tablet 400 mg of elemental calcium  400 mg of elemental calcium Oral QHS Jeneen Rinks  P Tyeler Goedken, MD      . celecoxib (CELEBREX) capsule 200 mg  200 mg Oral Q12H [name], MD      . cholecalciferol (VITAMIN D) tablet 2,500 Units  2,500 Units Oral QHS [name], MD      . clindamycin (CLEOCIN) IVPB 600 mg  600 mg Intravenous Q6H [name], MD      . diphenhydrAMINE (BENADRYL) 12.5 MG/5ML elixir 12.5-25 mg  12.5-25 mg Oral Q4H PRN [name], MD      . Derrill Memo ON 06/30/2016] enoxaparin (LOVENOX) injection 30 mg  30 mg Subcutaneous Q12H [name], MD      . Derrill Memo ON 07/02/2016] estradiol (ESTRACE) vaginal cream 1 Applicatorful  1 Applicatorful Vaginal Once per day on Mon Thu [name], MD      . ferrous sulfate tablet  325 mg  325 mg Oral BID WC [name], MD      . fluticasone (FLONASE) 50 MCG/ACT nasal spray 2 spray  2 spray Each Nare Daily PRN [name], MD      . hydrochlorothiazide (MICROZIDE) capsule 12.5 mg  12.5 mg Oral Daily PRN [name], MD      . loratadine (CLARITIN) tablet 10 mg  10 mg Oral Daily [name], MD      . magnesium hydroxide (MILK OF MAGNESIA) suspension 30 mL  30 mL Oral Daily PRN [name], MD      . magnesium oxide (MAG-OX) tablet 400 mg  400 mg Oral QHS [name], MD      . menthol-cetylpyridinium (CEPACOL) lozenge 3 mg  1 lozenge Oral PRN [name], MD       Or  . phenol (CHLORASEPTIC) mouth spray 1 spray  1 spray Mouth/Throat PRN [name], MD      . metoCLOPramide (REGLAN) tablet 10 mg  10 mg Oral TID AC & HS [name], MD      . morphine 2 MG/ML injection 2 mg  2 mg Intravenous Q2H PRN [name], MD      . omega-3 acid ethyl esters (LOVAZA) capsule 1,000 mg  1,000 mg Oral QHS [name], MD      . ondansetron (ZOFRAN) tablet 4 mg  4 mg Oral Q6H PRN [name], MD       Or  . ondansetron (ZOFRAN) injection 4 mg  4 mg Intravenous Q6H PRN [name], MD      . oxyCODONE (Oxy IR/ROXICODONE) immediate release tablet 5-10 mg  5-10 mg Oral Q4H PRN [name], MD      . pantoprazole (PROTONIX) EC tablet 40 mg  40 mg Oral BID [name], MD      . senna-docusate (Senokot-S) tablet 1 tablet  1 tablet Oral BID [name], MD      . sodium phosphate (FLEET) 7-19 GM/118ML enema 1 enema  1 enema Rectal Once PRN [name], MD      . traMADol Veatrice Bourbon) tablet 50-100 mg  50-100 mg Oral Q4H PRN [name], MD         Discharge Medications: Please see discharge summary for a list of discharge medications.  Relevant Imaging Results:  Relevant Lab Results:   Additional Information  (SSN: [id number])  Sample, Veronia Beets, LCSW

## 2016-06-29 NOTE — Anesthesia Procedure Notes (Signed)
Spinal  Patient location during procedure: OR Staffing Anesthesiologist: Gunnar Bulla Performed: anesthesiologist  Preanesthetic Checklist Completed: patient identified, site marked, surgical consent, pre-op evaluation, timeout performed, IV checked and risks and benefits discussed Spinal Block Patient position: sitting Prep: Betadine Patient monitoring: heart rate, cardiac monitor, continuous pulse ox and blood pressure Approach: midline Location: L3-4 Injection technique: single-shot Needle Needle type: Pencil-Tip  Needle gauge: 25 G Needle length: 9 cm Assessment Sensory level: T10 Additional Notes 1206 marcaine 1.96ml and tetracaine 4 mg.

## 2016-06-29 NOTE — Anesthesia Preprocedure Evaluation (Signed)
Anesthesia Evaluation  Patient identified by MRN, date of birth, ID band Patient awake    Reviewed: Allergy & Precautions, NPO status , Patient's Chart, lab work & pertinent test results, reviewed documented beta blocker date and time   Airway Mallampati: III  TM Distance: >3 FB     Dental  (+) Chipped   Pulmonary pneumonia, resolved, COPD, former smoker,           Cardiovascular hypertension,      Neuro/Psych    GI/Hepatic GERD  Controlled,  Endo/Other    Renal/GU      Musculoskeletal  (+) Arthritis ,   Abdominal   Peds  Hematology   Anesthesia Other Findings Obese.  Reproductive/Obstetrics                             Anesthesia Physical Anesthesia Plan  ASA: III  Anesthesia Plan: Spinal   Post-op Pain Management:    Induction:   Airway Management Planned:   Additional Equipment:   Intra-op Plan:   Post-operative Plan:   Informed Consent: I have reviewed the patients History and Physical, chart, labs and discussed the procedure including the risks, benefits and alternatives for the proposed anesthesia with the patient or authorized representative who has indicated his/her understanding and acceptance.     Plan Discussed with: CRNA  Anesthesia Plan Comments:         Anesthesia Quick Evaluation

## 2016-06-29 NOTE — Anesthesia Post-op Follow-up Note (Cosign Needed)
Anesthesia QCDR form completed.        

## 2016-06-29 NOTE — Transfer of Care (Signed)
Immediate Anesthesia Transfer of Care Note  Patient: Amber Perez  Procedure(s) Performed: Procedure(s): COMPUTER ASSISTED TOTAL KNEE ARTHROPLASTY (Right)  Patient Location: PACU  Anesthesia Type:Spinal  Level of Consciousness: awake and sedated  Airway & Oxygen Therapy: Patient Spontanous Breathing and Patient connected to nasal cannula oxygen  Post-op Assessment: Report given to RN and Post -op Vital signs reviewed and stable  Post vital signs: Reviewed and stable  Last Vitals:  Vitals:   06/29/16 1000  BP: (!) 149/79  Pulse: (!) 103  Resp: 18  Temp: 36.8 C    Last Pain:  Vitals:   06/29/16 1000  TempSrc: Oral  PainSc: 2       Patients Stated Pain Goal: 1 (16/10/96 0454)  Complications: No apparent anesthesia complications

## 2016-06-29 NOTE — Progress Notes (Signed)
PHARMACIST - PHYSICIAN ORDER COMMUNICATION  CONCERNING: P&T Medication Policy on Herbal Medications  DESCRIPTION:  This patient's order for:  Hair-Skin-Nails caps  has been noted.  This product(s) is classified as an "herbal" or natural product. Due to a lack of definitive safety studies or FDA approval, nonstandard manufacturing practices, plus the potential risk of unknown drug-drug interactions while on inpatient medications, the Pharmacy and Therapeutics Committee does not permit the use of "herbal" or natural products of this type within [facility].   ACTION TAKEN: The pharmacy department is unable to verify this order at this time and your patient has been informed of this safety policy. Please reevaluate patient's clinical condition at discharge and address if the herbal or natural product(s) should be resumed at that time.

## 2016-06-29 NOTE — Op Note (Signed)
OPERATIVE NOTE  DATE OF SURGERY:  06/29/2016  PATIENT NAME:  Akya Fiorello   DOB: 05-16-1945  MRN: [id number]  PRE-OPERATIVE DIAGNOSIS: Degenerative arthrosis of the right knee, primary  POST-OPERATIVE DIAGNOSIS:  Same  PROCEDURE:  Right total knee arthroplasty using computer-assisted navigation  SURGEON:  Marciano Sequin. M.D.  ASSISTANT:  Reche Dixon, PA-c (present and scrubbed throughout the case, critical for assistance with exposure, retraction, instrumentation, and closure)  ANESTHESIA: spinal  ESTIMATED BLOOD LOSS: 50 mL  FLUIDS REPLACED: 1700 mL of crystalloid  TOURNIQUET TIME: 95 minutes  DRAINS: 2 medium drains to a reinfusion system  SOFT TISSUE RELEASES: Anterior cruciate ligament, posterior cruciate ligament, deep medial collateral ligament, patellofemoral ligament  IMPLANTS UTILIZED: DePuy Attune size 3 posterior stabilized femoral component (cemented), size 3 rotating platform tibial component (cemented), 32 mm medialized dome patella (cemented), and a 5 mm stabilized rotating platform polyethylene insert.  INDICATIONS FOR SURGERY: Tabria Steines is a 71 y.o. year old female with a long history of progressive knee pain. X-rays demonstrated severe degenerative changes in tricompartmental fashion. The patient had not seen any significant improvement despite conservative nonsurgical intervention. After discussion of the risks and benefits of surgical intervention, the patient expressed understanding of the risks benefits and agree with plans for total knee arthroplasty.   The risks, benefits, and alternatives were discussed at length including but not limited to the risks of infection, bleeding, nerve injury, stiffness, blood clots, the need for revision surgery, cardiopulmonary complications, among others, and they were willing to proceed.  PROCEDURE IN DETAIL: The patient was brought into the operating room and, after adequate spinal anesthesia was  achieved, a tourniquet was placed on the patient's upper thigh. The patient's knee and leg were cleaned and prepped with alcohol and DuraPrep and draped in the usual sterile fashion. A "timeout" was performed as per usual protocol. The lower extremity was exsanguinated using an Esmarch, and the tourniquet was inflated to 300 mmHg. An anterior longitudinal incision was made followed by a standard mid vastus approach. The deep fibers of the medial collateral ligament were elevated in a subperiosteal fashion off of the medial flare of the tibia so as to maintain a continuous soft tissue sleeve. The patella was subluxed laterally and the patellofemoral ligament was incised. Inspection of the knee demonstrated severe degenerative changes with full-thickness loss of articular cartilage. Osteophytes were debrided using a rongeur. Anterior and posterior cruciate ligaments were excised. Two 4.0 mm Schanz pins were inserted in the femur and into the tibia for attachment of the array of trackers used for computer-assisted navigation. Hip center was identified using a circumduction technique. Distal landmarks were mapped using the computer. The distal femur and proximal tibia were mapped using the computer. The distal femoral cutting guide was positioned using computer-assisted navigation so as to achieve a 5 distal valgus cut. The femur was sized and it was felt that a size 3 femoral component was appropriate. A size 3 femoral cutting guide was positioned and the anterior cut was performed and verified using the computer. This was followed by completion of the posterior and chamfer cuts. Femoral cutting guide for the central box was then positioned in the center box cut was performed.  Attention was then directed to the proximal tibia. Medial and lateral menisci were excised. The extramedullary tibial cutting guide was positioned using computer-assisted navigation so as to achieve a 0 varus-valgus alignment and 3  posterior slope. The cut was performed and verified using the computer.  The proximal tibia was sized and it was felt that a size 3 tibial tray was appropriate. Tibial and femoral trials were inserted followed by insertion of a 5 mm polyethylene insert. This allowed for excellent mediolateral soft tissue balancing both in flexion and in full extension. Finally, the patella was cut and prepared so as to accommodate a 32 mm medialized dome patella. A patella trial was placed and the knee was placed through a range of motion with excellent patellar tracking appreciated. The femoral trial was removed after debridement of posterior osteophytes. The central post-hole for the tibial component was reamed followed by insertion of a keel punch. Tibial trials were then removed. Cut surfaces of bone were irrigated with copious amounts of normal saline with antibiotic solution using pulsatile lavage and then suctioned dry. Polymethylmethacrylate cement was prepared in the usual fashion using a vacuum mixer. Cement was applied to the cut surface of the proximal tibia as well as along the undersurface of a size 3 rotating platform tibial component. Tibial component was positioned and impacted into place. Excess cement was removed using [employer] fast streamer. Cement was then applied to the cut surfaces of the femur as well as along the posterior flanges of the size 3 femoral component. The femoral component was positioned and impacted into place. Excess cement was removed using [employer] fast streamer. A 5 mm polyethylene trial was inserted and the knee was brought into full extension with steady axial compression applied. Finally, cement was applied to the backside of a 35 mm medialized dome patella and the patellar component was positioned and patellar clamp applied. Excess cement was removed using [employer] fast streamer. After adequate curing of the cement, the tourniquet was deflated after a total tourniquet time of 95 minutes. Hemostasis was  achieved using electrocautery. The knee was irrigated with copious amounts of normal saline with antibiotic solution using pulsatile lavage and then suctioned dry. 20 mL of 1.3% Exparel and 60 mL of 0.25% Marcaine in 40 mL of normal saline was injected along the posterior capsule, medial and lateral gutters, and along the arthrotomy site. A 5 mm stabilized rotating platform polyethylene insert was inserted and the knee was placed through a range of motion with excellent mediolateral soft tissue balancing appreciated and excellent patellar tracking noted. 2 medium drains were placed in the wound bed and brought out through separate stab incisions to be attached to a reinfusion system. The medial parapatellar portion of the incision was reapproximated using interrupted sutures of #1 Vicryl. Subcutaneous tissue was approximated in layers using first #0 Vicryl followed #2-0 Vicryl. The skin was approximated with skin staples. A sterile dressing was applied.  The patient tolerated the procedure well and was transported to the recovery room in stable condition.    Gustaf Mccarter P. Holley Bouche., M.D.

## 2016-06-30 ENCOUNTER — Encounter: Payer: Self-pay | Admitting: Orthopedic Surgery

## 2016-06-30 LAB — CBC
HEMATOCRIT: 36.6 % (ref 35.0–47.0)
HEMOGLOBIN: 12.4 g/dL (ref 12.0–16.0)
MCH: 29.5 pg (ref 26.0–34.0)
MCHC: 33.8 g/dL (ref 32.0–36.0)
MCV: 87.4 fL (ref 80.0–100.0)
Platelets: 228 10*3/uL (ref 150–440)
RBC: 4.19 MIL/uL (ref 3.80–5.20)
RDW: 14.7 % — AB (ref 11.5–14.5)
WBC: 9.6 10*3/uL (ref 3.6–11.0)

## 2016-06-30 LAB — BASIC METABOLIC PANEL
ANION GAP: 8 (ref 5–15)
BUN: 10 mg/dL (ref 6–20)
CALCIUM: 8.5 mg/dL — AB (ref 8.9–10.3)
CHLORIDE: 102 mmol/L (ref 101–111)
CO2: 25 mmol/L (ref 22–32)
Creatinine, Ser: 0.63 mg/dL (ref 0.44–1.00)
GFR calc non Af Amer: >60 mL/min (ref >60)
GLUCOSE: 176 mg/dL — AB (ref 65–99)
POTASSIUM: 3.5 mmol/L (ref 3.5–5.1)
Sodium: 135 mmol/L (ref 135–145)

## 2016-06-30 NOTE — Care Management Note (Signed)
Case Management Note  Patient Details  Name: Christopher Hink MRN: [id number] Date of Birth: 30-Jul-1945  Subjective/Objective:  POD # 1 s/p right TKA. Met with patient at bedside to discuss discharge planning. She lives at home alone but her daughter and granddaughter will be staying with her for a while to help her following surgery. She will need a walker. Ordered from [facility] for delivery today. Offered choice of home health agencies. Referral to [facility] for HHPT/ HHOT. Pharmacy: [facility]  [phone]. Called Lovenox 40 mg # 14 no refills.                   Action/Plan: [facility] for HHPT/OT. [facility] for walker. Lovenox called in.  Expected Discharge Date:                  Expected Discharge Plan:  [place]  In-House Referral:     Discharge planning Services  CM Consult  Post Acute Care Choice:  Durable Medical Equipment, Home Health Choice offered to:  Patient  DME Arranged:  Walker rolling DME Agency:  [place]:  PT [place]:  [facility] (now [facility])  Status of Service:  In process, will continue to follow  If discussed at Long Length of Stay Meetings, dates discussed:    Additional Comments:  Jolly Mango, RN 06/30/2016, 3:07 PM

## 2016-06-30 NOTE — Anesthesia Postprocedure Evaluation (Signed)
Anesthesia Post Note  Patient: Amber Perez  Procedure(s) Performed: Procedure(s) (LRB): COMPUTER ASSISTED TOTAL KNEE ARTHROPLASTY (Right)  Patient location during evaluation: Nursing Unit Anesthesia Type: Spinal Level of consciousness: awake, awake and alert and oriented Pain management: pain level controlled Vital Signs Assessment: post-procedure vital signs reviewed and stable Respiratory status: spontaneous breathing Cardiovascular status: stable Postop Assessment: no headache and no backache Anesthetic complications: no     Last Vitals:  Vitals:   06/30/16 0303 06/30/16 0349  BP: 125/66 (!) 131/56  Pulse: 94 80  Resp: 18 18  Temp: 36.7 C 36.8 C    Last Pain:  Vitals:   06/30/16 0602  TempSrc:   PainSc: 8                  Dessirae Scarola,  Yareliz Thorstenson R

## 2016-06-30 NOTE — Progress Notes (Signed)
qPhysical Therapy Treatment Patient Details Name: Amber Perez MRN: [id number] DOB: 1946-01-28 Today's Date: 06/30/2016    History of Present Illness 71 y/o female s/p R TKA 06/29/16.PMHX significant for OA, COPD, pre-diabetes, dyslipidemia, osteoporosis, and GERD.    PT Comments    Pt continues to do very well with PT having very reasonable POD1 pain, knee AROM full TKE and >90 of flexion.  She was easily able to circumambulate the nurses' station with consistent cadence and safety and negotiated up/down stairs w/o issue.  Pt doing very well, eager to get home tomorrow.   Follow Up Recommendations  Home health PT     Equipment Recommendations  Rolling walker with 5" wheels    Recommendations for Other Services       Precautions / Restrictions Precautions Precautions: Knee;Fall Restrictions RLE Weight Bearing: Weight bearing as tolerated    Mobility  Bed Mobility Overal bed mobility: Independent                Transfers Overall transfer level: Independent Equipment used: Rolling walker (2 wheeled) Transfers: Sit to/from Stand Sit to Stand: Modified independent (Device/Increase time)         General transfer comment: minimal cuing for hand and foot placement, able to rise/sit w/o issue  Ambulation/Gait Ambulation/Gait assistance: Supervision Ambulation Distance (Feet): 250 Feet Assistive device: Rolling walker (2 wheeled)       General Gait Details: Pt walks with exceptional confidence and consistent cadence.  Nearly needs cuing to slow down, though she was safe the entire time.     Stairs Stairs: Yes   Stair Management: Two rails;One rail Right;Sideways;Forwards Number of Stairs: 4 General stair comments: did 4 steps X2, one with b/l rails one with single rails.  Pt safe and confident with both strategies, needing only minimal cuing to perform safely and appropriately  Wheelchair Mobility    Modified Rankin (Stroke Patients Only)        Balance Overall balance assessment: Modified Independent                                          Cognition Arousal/Alertness: Awake/alert Behavior During Therapy: WFL for tasks assessed/performed Overall Cognitive Status: Within Functional Limits for tasks assessed                                        Exercises Total Joint Exercises Ankle Circles/Pumps: AROM;10 reps Quad Sets: Strengthening;15 reps Gluteal Sets: Strengthening;15 reps Short Arc Quad: Strengthening;15 reps Heel Slides: AROM;10 reps Hip ABduction/ADduction: Strengthening;10 reps Straight Leg Raises: AROM;10 reps Goniometric ROM: 0-91 AROM    General Comments        Pertinent Vitals/Pain Pain Score: 4     Home Living                      Prior Function            PT Goals (current goals can now be found in the care plan section) Progress towards PT goals: Progressing toward goals    Frequency    BID      PT Plan Current plan remains appropriate    Co-evaluation             End of Session Equipment Utilized During Treatment: Gait belt Activity Tolerance:  Patient tolerated treatment well Patient left: with bed alarm set;with call bell/phone within reach   PT Visit Diagnosis: Muscle weakness (generalized) (M62.81);Difficulty in walking, not elsewhere classified (R26.2)     Time: 5520-8022 PT Time Calculation (min) (ACUTE ONLY): 27 min  Charges:  $Gait Training: 8-22 mins $Therapeutic Exercise: 8-22 mins                    G Codes:          Kreg Shropshire, DPT 06/30/2016, 3:40 PM

## 2016-06-30 NOTE — Evaluation (Signed)
Occupational Therapy Evaluation Patient Details Name: Amber Perez MRN: [id number] DOB: May 09, 1945 Today's Date: 06/30/2016    History of Present Illness 71 y/o female s/p R TKA 06/29/16.PMHX significant for OA, COPD, pre-diabetes, dyslipidemia, osteoporosis, and GERD.   Clinical Impression   Pt seen for OT evaluation this date. Pt was independent prior to surgery but experiencing increasing difficulty with LB dressing tasks but still able to perform all ADL, driving independently. Pt was very active prior to surgery and is eager to return home at [facility]. Pt presents with pain, decrease strength/ROM in R knee, decreased activity tolerance, and increased need for assist with ADL. Pt would benefit from skilled OT services to address noted impairments and functional deficits including AE for LB ADL and energy conservation strategies and home/routines modifications to maximize functional independence and falls prevention.     Follow Up Recommendations  Home health OT    Equipment Recommendations  3 in 1 bedside commode    Recommendations for Other Services       Precautions / Restrictions Precautions Precautions: Knee;Fall Precaution Comments: able to perform SLR, did not use KI during OT session Required Braces or Orthoses: Knee Immobilizer - Right Knee Immobilizer - Right: Discontinue once straight leg raise with < 10 degree lag Restrictions Weight Bearing Restrictions: Yes RLE Weight Bearing: Weight bearing as tolerated      Mobility Bed Mobility Overal bed mobility: Modified Independent             General bed mobility comments: Pt able to get herself to sitting EOB w/o rails and w/o assist  Transfers Overall transfer level: Needs assistance Equipment used: Rolling walker (2 wheeled) Transfers: Sit to/from Stand Sit to Stand: Min guard;Supervision         General transfer comment: sup-min guard and verbal cues for hand placement     Balance Overall balance  assessment: Modified Independent                                         ADL either performed or assessed with clinical judgement   ADL Overall ADL's : Needs assistance/impaired Eating/Feeding: Sitting;Set up   Grooming: Standing;Wash/dry face;Wash/dry hands;Oral care;Supervision/safety   Upper Body Bathing: Sitting;Set up   Lower Body Bathing: Sitting/lateral leans;Sit to/from stand;Set up;With adaptive equipment;Supervison/ safety   Upper Body Dressing : Set up;Sitting   Lower Body Dressing: Minimal assistance;Cueing for compensatory techniques;Sit to/from stand;Sitting/lateral leans;With adaptive equipment Lower Body Dressing Details (indicate cue type and reason): Min assist to manage wound vac, verbal cues for technique using AE Toilet Transfer: Ambulation;Comfort height toilet;RW;Min guard Toilet Transfer Details (indicate cue type and reason): with verbal cues for hand placement to maximize safety Toileting- Clothing Manipulation and Hygiene: Sitting/lateral lean;Modified independent       Functional mobility during ADLs: Min guard;Rolling walker General ADL Comments: generally Min A for LB ADL, verbal cues for hand placement during transfers     Vision Baseline Vision/History: Wears glasses Wears Glasses: Distance only (for driving at night) Patient Visual Report: No change from baseline Vision Assessment?: No apparent visual deficits     Perception     Praxis Praxis Praxis tested?: Within functional limits    Pertinent Vitals/Pain Pain Assessment: 0-10 Pain Score: 6  Pain Location: 4/10 at rest, increasing to 6/10 with transitional movements Pain Descriptors / Indicators: Aching Pain Intervention(s): Limited activity within patient's tolerance;Monitored during session;Premedicated before session;Ice applied;Repositioned  Hand Dominance     Extremity/Trunk Assessment Upper Extremity Assessment Upper Extremity Assessment: Overall WFL for  tasks assessed   Lower Extremity Assessment Lower Extremity Assessment: Defer to PT evaluation;RLE deficits/detail RLE Deficits / Details: expected R LE limitations/pain but able to do SLRs and generally functional post-op   Cervical / Trunk Assessment Cervical / Trunk Assessment: Normal   Communication Communication Communication: No difficulties   Cognition Arousal/Alertness: Awake/alert Behavior During Therapy: WFL for tasks assessed/performed Overall Cognitive Status: Within Functional Limits for tasks assessed                                     General Comments       Exercises Other Exercises Other Exercises: Pt educated in home/routines modifications to maximize safety/falls prevention and independence at home   Shoulder Instructions      Home Living Family/patient expects to be discharged to:: Private residence Living Arrangements: Alone Available Help at Discharge: Family;Available PRN/intermittently;Available 24 hours/day (daughter staying w/ her first night, granddaughter will stay w/ her for 1 week) Type of Home: House Home Access: Stairs to enter [employer] of Steps: 4 Entrance Stairs-Rails: Right;Left Home Layout: One level     Bathroom Shower/Tub: Corporate investment banker: Handicapped height (regular with toilet riser) Bathroom Accessibility: Yes How Accessible: Accessible via walker Home Equipment: Walker - standard;Other (comment) (crutches)          Prior Functioning/Environment Level of Independence: Independent        Comments: Pt has been limping on R LE recently, but generally independent and active; attends water aerobics and walks a couples times per week; increased difficulty recently with socks and shoes but still able to do on her own        OT Problem List: Decreased strength;Pain;Decreased range of motion;Decreased activity tolerance;Decreased knowledge of use of DME or AE;Decreased  safety awareness      OT Treatment/Interventions: Self-care/ADL training;DME and/or AE instruction;Patient/family education;Energy conservation    OT Goals(Current goals can be found in the care plan section) Acute Rehab OT Goals Patient Stated Goal: go home OT Goal Formulation: With patient Time For Goal Achievement: 07/14/16 Potential to Achieve Goals: Good  OT Frequency: Min 2X/week   Barriers to D/C:            Co-evaluation              End of Session Equipment Utilized During Treatment: Gait belt;Rolling walker  Activity Tolerance: Patient tolerated treatment well Patient left: in bed;with call bell/phone within reach;with bed alarm set;with SCD's reapplied;Other (comment) (polar care in place)  OT Visit Diagnosis: Other abnormalities of gait and mobility (R26.89);Pain;Muscle weakness (generalized) (M62.81) Pain - Right/Left: Right Pain - part of body: Knee                Time: 8850-2774 OT Time Calculation (min): 49 min Charges:  OT General Charges $OT Visit: 1 Procedure OT Evaluation $OT Eval Low Complexity: 1 Procedure OT Treatments $Self Care/Home Management : 38-52 mins G-Codes:     Jeni Salles, MPH, MS, OTR/L ascom [phone] 06/30/16, 10:39 AM

## 2016-06-30 NOTE — Progress Notes (Signed)
Clinical Social Worker (CSW) received SNF consult. PT is recommending home health. RN case manager aware of above. Please reconsult if future social work needs arise. CSW signing off.   Florencio Hollibaugh, LCSW [phone] 

## 2016-06-30 NOTE — Evaluation (Signed)
Physical Therapy Evaluation Patient Details Name: Amber Perez MRN: [id number] DOB: February 16, 1946 Today's Date: 06/30/2016   History of Present Illness  71 y/o female s/p R TKA 06/29/16.  Clinical Impression  Pt did very well with initial PT exam and was able to relatively easily perform all bed mobility, transfers, and ambulation tasks as well as getting >80 degrees of PROM flexion on the R.  She did well with ~12 minutes of exercises including 10 AROM SLRs apart from the exam and confidently walking into the hallway with consistent and reciprocal gait.  She generally did very well and is eager to get home and get back to her normal active self.     Follow Up Recommendations Home health PT    Equipment Recommendations  Rolling walker with 5" wheels    Recommendations for Other Services       Precautions / Restrictions Precautions Precautions: Knee;Fall Restrictions Weight Bearing Restrictions: Yes RLE Weight Bearing: Weight bearing as tolerated      Mobility  Bed Mobility Overal bed mobility: Modified Independent             General bed mobility comments: Pt able to get herself to sitting EOB w/o rails and w/o assist  Transfers Overall transfer level: Modified independent Equipment used: Rolling walker (2 wheeled)             General transfer comment: Pt needing only light cues for set up and seuqncing, able to rise and sit w/o assist  Ambulation/Gait Ambulation/Gait assistance: Supervision Ambulation Distance (Feet): 75 Feet Assistive device: Rolling walker (2 wheeled)       General Gait Details: Pt took to [facility] and reciprocal gait very quickly and confidently showing ability to maintain good speed and cadence the entire effort.  She reported some soreness with the effort, but generally did well and was safe.  Stairs            Wheelchair Mobility    Modified Rankin (Stroke Patients Only)       Balance Overall balance assessment:  Modified Independent                                           Pertinent Vitals/Pain Pain Assessment: 0-10 Pain Score: 5  (increases to ~7/10 with ROM, 4/5 after session)    Home Living Family/patient expects to be discharged to:: Private residence Living Arrangements: Alone Available Help at Discharge: Family (g-daughter will stay with her ~1 week, drt can help too) Type of Home: House Home Access: Stairs to enter Entrance Stairs-Rails: Psychiatric nurse of Steps: 4   Home Equipment: Environmental consultant - standard      Prior Function Level of Independence: Independent         Comments: Pt has been limping on R LE recently, but generally independent and active     Hand Dominance        Extremity/Trunk Assessment   Upper Extremity Assessment Upper Extremity Assessment: Overall WFL for tasks assessed    Lower Extremity Assessment Lower Extremity Assessment: RLE deficits/detail RLE Deficits / Details: expected R LE limitations/pain but able to do SLRs and generally functional post-op       Communication   Communication: No difficulties  Cognition Arousal/Alertness: Awake/alert Behavior During Therapy: WFL for tasks assessed/performed Overall Cognitive Status: Within Functional Limits for tasks assessed  General Comments      Exercises Total Joint Exercises Ankle Circles/Pumps: AROM;10 reps Quad Sets: Strengthening;10 reps Gluteal Sets: Strengthening;10 reps Short Arc Quad: AROM;Strengthening;10 reps Heel Slides: AROM;10 reps Hip ABduction/ADduction: Strengthening;10 reps Straight Leg Raises: AROM;10 reps Goniometric ROM: 0-81   Assessment/Plan    PT Assessment Patient needs continued PT services  PT Problem List Decreased strength;Decreased range of motion;Decreased activity tolerance;Decreased balance;Decreased mobility;Decreased safety awareness;Pain;Cardiopulmonary status  limiting activity       PT Treatment Interventions DME instruction;Gait training;Stair training;Functional mobility training;Therapeutic activities;Therapeutic exercise;Balance training;Neuromuscular re-education;Patient/family education    PT Goals (Current goals can be found in the Care Plan section)  Acute Rehab PT Goals Patient Stated Goal: go home PT Goal Formulation: With patient Time For Goal Achievement: 07/14/16 Potential to Achieve Goals: Good    Frequency BID   Barriers to discharge        Co-evaluation               End of Session Equipment Utilized During Treatment: Gait belt Activity Tolerance: Patient tolerated treatment well     PT Visit Diagnosis: Muscle weakness (generalized) (M62.81);Difficulty in walking, not elsewhere classified (R26.2)    Time: 8676-7209 PT Time Calculation (min) (ACUTE ONLY): 39 min   Charges:   PT Evaluation $PT Eval Low Complexity: 1 Procedure PT Treatments $Therapeutic Exercise: 8-22 mins   PT G Codes:          Kreg Shropshire, DPT 06/30/2016, 10:23 AM

## 2016-06-30 NOTE — Progress Notes (Signed)
  Subjective: 1 Day Post-Op Procedure(s) (LRB): COMPUTER ASSISTED TOTAL KNEE ARTHROPLASTY (Right) Patient reports pain as moderate.   Patient seen in rounds with [name]. Patient is well, and has had no acute complaints or problems Plan is to go Home versus rehabilitation after hospital stay. Negative for chest pain and shortness of breath Fever: no Gastrointestinal: Negative for nausea and vomiting  Objective: Vital signs in last 24 hours: Temp:  [97.3 F (36.3 C)-98.6 F (37 C)] 98.2 F (36.8 C) (03/27 0349) Pulse Rate:  [65-103] 80 (03/27 0349) Resp:  [12-20] 18 (03/27 0349) BP: (91-149)/(56-79) 131/56 (03/27 0349) SpO2:  [92 %-100 %] 92 % (03/27 0349) Weight:  [81.2 kg (179 lb)] 81.2 kg (179 lb) (03/26 1000)  Intake/Output from previous day:  Intake/Output Summary (Last 24 hours) at 06/30/16 0621 Last data filed at 06/30/16 0358  Gross per 24 hour  Intake          2913.33 ml  Output             2150 ml  Net           763.33 ml    Intake/Output this shift: Total I/O In: 200 [IV Piggyback:200] Out: 1500 [Urine:1350; Drains:150]  Labs:  Recent Labs  06/30/16 0336  HGB 12.4    Recent Labs  06/30/16 0336  WBC 9.6  RBC 4.19  HCT 36.6  PLT 228    Recent Labs  06/30/16 0336  NA 135  K 3.5  CL 102  CO2 25  BUN 10  CREATININE 0.63  GLUCOSE 176*  CALCIUM 8.5*   No results for input(s): LABPT, INR in the last 72 hours.   EXAM General - Patient is Alert and Oriented Extremity - Sensation intact distally Dorsiflexion/Plantar flexion intact Compartment soft Dressing/Incision - clean, dry, no drainage with the Hemovac intact Motor Function - intact, moving foot and toes well on exam. The patient was able to do a straight leg raise independently.  Past Medical History:  Diagnosis Date  . Closed left arm fracture    [facility]   . COPD (chronic obstructive pulmonary disease) ([place])   . Dyslipidemia   . GERD (gastroesophageal reflux disease)    . Hypertension 2008  . Osteoarthritis   . Osteoporosis   . Pneumonia 2008  . Pre-diabetes   . Thyroid nodule   . Vitamin D deficiency     Assessment/Plan: 1 Day Post-Op Procedure(s) (LRB): COMPUTER ASSISTED TOTAL KNEE ARTHROPLASTY (Right) Active Problems:   S/P total knee arthroplasty  Estimated body mass index is 35.55 kg/m as calculated from the following:   Height as of this encounter: 4' 11.5" (1.511 m).   Weight as of this encounter: 81.2 kg (179 lb). Advance diet Up with therapy D/C IV fluids Plan for discharge tomorrow to home with home health physical therapy  DVT Prophylaxis - Lovenox, Foot Pumps and TED hose Weight-Bearing as tolerated to right leg  Reche Dixon, PA-C Orthopaedic Surgery 06/30/2016, 6:21 AM

## 2016-07-01 LAB — BASIC METABOLIC PANEL
Anion gap: 5 (ref 5–15)
BUN: 14 mg/dL (ref 6–20)
CALCIUM: 8.7 mg/dL — AB (ref 8.9–10.3)
CHLORIDE: 103 mmol/L (ref 101–111)
CO2: 29 mmol/L (ref 22–32)
CREATININE: 0.64 mg/dL (ref 0.44–1.00)
GFR calc non Af Amer: >60 mL/min (ref >60)
Glucose, Bld: 135 mg/dL — ABNORMAL HIGH (ref 65–99)
Potassium: 3.2 mmol/L — ABNORMAL LOW (ref 3.5–5.1)
SODIUM: 137 mmol/L (ref 135–145)

## 2016-07-01 LAB — CBC
HEMATOCRIT: 34.4 % — AB (ref 35.0–47.0)
HEMOGLOBIN: 11.7 g/dL — AB (ref 12.0–16.0)
MCH: 29.7 pg (ref 26.0–34.0)
MCHC: 34.1 g/dL (ref 32.0–36.0)
MCV: 87 fL (ref 80.0–100.0)
Platelets: 234 10*3/uL (ref 150–440)
RBC: 3.95 MIL/uL (ref 3.80–5.20)
RDW: 14.5 % (ref 11.5–14.5)
WBC: 10.9 10*3/uL (ref 3.6–11.0)

## 2016-07-01 MED ORDER — TRAMADOL HCL 50 MG PO TABS: 50.0000 mg | ORAL_TABLET | ORAL | 1 refills | Status: DC | PRN

## 2016-07-01 MED ORDER — OXYCODONE HCL 5 MG PO TABS: 5.0000 mg | ORAL_TABLET | ORAL | 0 refills | Status: DC | PRN

## 2016-07-01 MED ORDER — ENOXAPARIN SODIUM 30 MG/0.3ML ~~LOC~~ SOLN: 40.0000 mg | SUBCUTANEOUS | 0 refills | Status: DC

## 2016-07-01 MED ORDER — CELECOXIB 200 MG PO CAPS: 200.0000 mg | ORAL_CAPSULE | Freq: Two times a day (BID) | ORAL | 0 refills | Status: DC

## 2016-07-01 NOTE — Progress Notes (Signed)
  Subjective: 2 Days Post-Op Procedure(s) (LRB): COMPUTER ASSISTED TOTAL KNEE ARTHROPLASTY (Right) Patient reports pain as mild.   Patient seen in rounds with [name]. Patient is well, and has had no acute complaints or problems Plan is to go Home after hospital stay. Negative for chest pain and shortness of breath Fever: no Gastrointestinal: Negative for nausea and vomiting  Objective: Vital signs in last 24 hours: Temp:  [98 F (36.7 C)-98.8 F (37.1 C)] 98.8 F (37.1 C) (03/27 2328) Pulse Rate:  [80-90] 80 (03/27 2328) Resp:  [18] 18 (03/27 2328) BP: (122-139)/(69-79) 139/79 (03/27 2328) SpO2:  [92 %-98 %] 98 % (03/27 2328)  Intake/Output from previous day:  Intake/Output Summary (Last 24 hours) at 07/01/16 0638 Last data filed at 07/01/16 0400  Gross per 24 hour  Intake          3308.33 ml  Output               70 ml  Net          3238.33 ml    Intake/Output this shift: Total I/O In: 2828.3 [P.O.:640; I.V.:2188.3] Out: -   Labs:  Recent Labs  06/30/16 0336 07/01/16 0439  HGB 12.4 11.7*    Recent Labs  06/30/16 0336 07/01/16 0439  WBC 9.6 10.9  RBC 4.19 3.95  HCT 36.6 34.4*  PLT 228 234    Recent Labs  06/30/16 0336 07/01/16 0439  NA 135 137  K 3.5 3.2*  CL 102 103  CO2 25 29  BUN 10 14  CREATININE 0.63 0.64  GLUCOSE 176* 135*  CALCIUM 8.5* 8.7*   No results for input(s): LABPT, INR in the last 72 hours.   EXAM General - Patient is Alert and Oriented Extremity - Sensation intact distally Dorsiflexion/Plantar flexion intact Compartment soft Dressing/Incision - clean, dry, no drainage with the Hemovac Removed. The honeycomb dressing is intact. Motor Function - intact, moving foot and toes well on exam. The patient was able to do a straight leg raise independently. The patient ambulated 250 feet with physical therapy.  Past Medical History:  Diagnosis Date  . Closed left arm fracture     [facility]   . COPD (chronic  obstructive pulmonary disease) ([place])   . Dyslipidemia   . GERD (gastroesophageal reflux disease)   . Hypertension 2008  . Osteoarthritis   . Osteoporosis   . Pneumonia 2008  . Pre-diabetes   . Thyroid nodule   . Vitamin D deficiency     Assessment/Plan: 2 Days Post-Op Procedure(s) (LRB): COMPUTER ASSISTED TOTAL KNEE ARTHROPLASTY (Right) Active Problems:   S/P total knee arthroplasty  Estimated body mass index is 35.55 kg/m as calculated from the following:   Height as of this encounter: 4' 11.5" (1.511 m).   Weight as of this encounter: 81.2 kg (179 lb). The patient has had a bowel movement. Physical therapy this morning.  Discharge today  with home health physical therapy  DVT Prophylaxis - Lovenox, Foot Pumps and TED hose Weight-Bearing as tolerated to right leg  Reche Dixon, PA-C Orthopaedic Surgery 07/01/2016, 6:38 AM

## 2016-07-01 NOTE — Care Management Note (Signed)
Case Management Note  Patient Details  Name: Amber Perez MRN: [id number] Date of Birth: Apr 27, 1945  Subjective/Objective:   Discharging today.                  Action/Plan: [facility] notified of discharge. Gilford Rile has been delivered. Cost of Lovenox is $ 111.87. Patient updated and denies issues paying for medication.   Expected Discharge Date:  07/01/16               Expected Discharge Plan:  [place]  In-House Referral:     Discharge planning Services  CM Consult  Post Acute Care Choice:  Durable Medical Equipment, Home Health Choice offered to:  Patient  DME Arranged:  Walker rolling DME Agency:  [place]:  PT [place]:  [facility] (now [facility])  Status of Service:  Completed, signed off  If discussed at [place] of Stay Meetings, dates discussed:    Additional Comments:  Jolly Mango, RN 07/01/2016, 8:56 AM

## 2016-07-01 NOTE — Discharge Summary (Signed)
Physician Discharge Summary  Subjective: 2 Days Post-Op Procedure(s) (LRB): COMPUTER ASSISTED TOTAL KNEE ARTHROPLASTY (Right) Patient reports pain as mild.   Patient seen in rounds with [name]. Patient is well, and has had no acute complaints or problems Patient is ready to go home with home health physical therapy.  Physician Discharge Summary  Patient ID: Amber Perez MRN: [id number] DOB/AGE: [birth year] 71 y.o.  Admit date: 06/29/2016 Discharge date: 07/01/2016  Admission Diagnoses:  Discharge Diagnoses:  Active Problems:   S/P total knee arthroplasty   Discharged Condition: good  Hospital Course: The patient is postop day 2 from a right total knee replacement. She is doing well since surgery. She ambulated 250 feet and has improved with her strength. She has had a bowel movement. She is ready to go home with home health physical therapy today.  Treatments: surgery:  Right total knee arthroplasty using computer-assisted navigation  SURGEON:  Marciano Sequin. M.D.  ASSISTANT:  Reche Dixon, PA-c (present and scrubbed throughout the case, critical for assistance with exposure, retraction, instrumentation, and closure)  ANESTHESIA: spinal  ESTIMATED BLOOD LOSS: 50 mL  FLUIDS REPLACED: 1700 mL of crystalloid  TOURNIQUET TIME: 95 minutes  DRAINS: 2 medium drains to a reinfusion system  SOFT TISSUE RELEASES: Anterior cruciate ligament, posterior cruciate ligament, deep medial collateral ligament, patellofemoral ligament  IMPLANTS UTILIZED: DePuy Attune size 3 posterior stabilized femoral component (cemented), size 3 rotating platform tibial component (cemented), 32 mm medialized dome patella (cemented), and a 5 mm stabilized rotating platform polyethylene insert.  Discharge Exam: Blood pressure 139/79, pulse 80, temperature 98.8 F (37.1 C), temperature source Oral, resp. rate 18, height 4' 11.5" (1.511 m), weight 81.2 kg (179 lb), SpO2 98  %.   Disposition:    Allergies as of 07/01/2016      Reactions   Penicillins Hives, Rash   Also drop in BP Has patient had a PCN reaction causing immediate rash, facial/tongue/throat swelling, SOB or lightheadedness with hypotension:Yes Has patient had a PCN reaction causing severe rash involving mucus membranes or skin necrosis:No Has patient had a PCN reaction that required hospitalization:No Has patient had a PCN reaction occurring within the last 10 years:No If all of the above answers are "NO", then may proceed with Cephalosporin use.   Bisphosphonates    Severe GI upset, damaged esophagus.      Medication List    TAKE these medications   amLODipine 5 MG tablet Commonly known as:  NORVASC TAKE 1 TABLET (5 MG TOTAL) BY MOUTH DAILY. What changed:  how much to take  how to take this  when to take this  additional instructions   CAL-MAG PO Take 2 tablets by mouth at bedtime.   celecoxib 200 MG capsule Commonly known as:  CELEBREX Take 1 capsule (200 mg total) by mouth every 12 (twelve) hours.   cetirizine 10 MG tablet Commonly known as:  ZYRTEC Take 1 tablet (10 mg total) by mouth daily. What changed:  when to take this  reasons to take this   COLLAGEN PO Take 1 Dose by mouth daily. Marine Collagen Peptide with MSM 1 scoop a day in hot coffee.   enoxaparin 30 MG/0.3ML injection Commonly known as:  LOVENOX Inject 0.4 mLs (40 mg total) into the skin daily.   estradiol 0.1 MG/GM vaginal cream Commonly known as:  ESTRACE Place 1 Applicatorful vaginally 2 (two) times a week. Every 4-5 days.   FISH OIL ULTRA 1400 MG Caps Take 1,400 mg by mouth  at bedtime.   HAIR/SKIN/NAILS PO Take 2 capsules by mouth at bedtime.   hydrochlorothiazide 12.5 MG capsule Commonly known as:  MICROZIDE Take 1 capsule (12.5 mg total) by mouth daily as needed. What changed:  reasons to take this   ibuprofen 200 MG tablet Commonly known as:  ADVIL,MOTRIN Take 800 mg by mouth  every 8 (eight) hours as needed (for pain.).   NASACORT AQ 55 MCG/ACT Aero nasal inhaler Generic drug:  triamcinolone Place 2 sprays into the nose as needed.   oxyCODONE 5 MG immediate release tablet Commonly known as:  Oxy IR/ROXICODONE Take 1-2 tablets (5-10 mg total) by mouth every 4 (four) hours as needed for severe pain or breakthrough pain.   pantoprazole 40 MG tablet Commonly known as:  PROTONIX Take 1 tablet (40 mg total) by mouth daily. What changed:  when to take this   traMADol 50 MG tablet Commonly known as:  ULTRAM Take 1-2 tablets (50-100 mg total) by mouth every 4 (four) hours as needed for moderate pain. What changed:  how much to take  when to take this  reasons to take this   TURMERIC PO Take 200 mg by mouth at bedtime.   VITAMIN D3 PO Take 2,500 Units by mouth at bedtime.            Durable Medical Equipment        Start     Ordered   06/29/16 1717  DME Walker rolling  Once    Question:  Patient needs a walker to treat with the following condition  Answer:  Total knee replacement status   06/29/16 1716   06/29/16 1717  DME Bedside commode  Once    Question:  Patient needs a bedside commode to treat with the following condition  Answer:  Total knee replacement status   06/29/16 1716     Follow-up Information    WOLFE,JON R., PA On 07/14/2016.   Specialty:  Physician Assistant Why:  at 1:15pm Contact information: [place] [address] [phone]        [name], MD On 08/11/2016.   Specialty:  Orthopedic Surgery Why:  at 9:15am Contact information: [place] [address] [phone]           Signed: Prescott Parma, TODD 07/01/2016, 6:43 AM   Objective: Vital signs in last 24 hours: Temp:  [98 F (36.7 C)-98.8 F (37.1 C)] 98.8 F (37.1 C) (03/27 2328) Pulse Rate:  [80-90] 80 (03/27 2328) Resp:  [18] 18 (03/27 2328) BP:  (122-139)/(69-79) 139/79 (03/27 2328) SpO2:  [92 %-98 %] 98 % (03/27 2328)  Intake/Output from previous day:  Intake/Output Summary (Last 24 hours) at 07/01/16 0643 Last data filed at 07/01/16 0400  Gross per 24 hour  Intake          3308.33 ml  Output               70 ml  Net          3238.33 ml    Intake/Output this shift: Total I/O In: 2828.3 [P.O.:640; I.V.:2188.3] Out: -   Labs:  Recent Labs  06/30/16 0336 07/01/16 0439  HGB 12.4 11.7*    Recent Labs  06/30/16 0336 07/01/16 0439  WBC 9.6 10.9  RBC 4.19 3.95  HCT 36.6 34.4*  PLT 228 234    Recent Labs  06/30/16 0336 07/01/16 0439  NA 135 137  K 3.5 3.2*  CL 102 103  CO2 25 29  BUN 10 14  CREATININE 0.63 0.64  GLUCOSE 176* 135*  CALCIUM 8.5* 8.7*   No results for input(s): LABPT, INR in the last 72 hours.  EXAM: General - Patient is Alert and Oriented Extremity - Sensation intact distally Dorsiflexion/Plantar flexion intact No cellulitis present Compartment soft Incision - clean, dry, no drainage Motor Function -  the patient ambulated 250 feet. She can plantarflex and dorsiflex her foot. She can do a straight leg raise independently.  Assessment/Plan: 2 Days Post-Op Procedure(s) (LRB): COMPUTER ASSISTED TOTAL KNEE ARTHROPLASTY (Right) Procedure(s) (LRB): COMPUTER ASSISTED TOTAL KNEE ARTHROPLASTY (Right) Past Medical History:  Diagnosis Date  . Closed left arm fracture    [facility]   . COPD (chronic obstructive pulmonary disease) ([place])   . Dyslipidemia   . GERD (gastroesophageal reflux disease)   . Hypertension 2008  . Osteoarthritis   . Osteoporosis   . Pneumonia 2008  . Pre-diabetes   . Thyroid nodule   . Vitamin D deficiency    Active Problems:   S/P total knee arthroplasty  Estimated body mass index is 35.55 kg/m as calculated from the following:   Height as of this encounter: 4' 11.5" (1.511 m).   Weight as of this encounter: 81.2 kg (179 lb). Discharge home with  home health Diet - Regular diet Follow up - in 2 weeks Activity - WBAT Disposition - Home Condition Upon Discharge - Stable DVT Prophylaxis - Lovenox and TED hose  Reche Dixon, PA-C Orthopaedic Surgery 07/01/2016, 6:43 AM

## 2016-07-01 NOTE — Discharge Instructions (Signed)

## 2016-07-01 NOTE — Progress Notes (Signed)
qPhysical Therapy Treatment Patient Details Name: Amber Perez MRN: [id number] DOB: 02/20/1946 Today's Date: 07/01/2016    History of Present Illness 71 y/o female s/p R TKA 06/29/16.PMHX significant for OA, COPD, pre-diabetes, dyslipidemia, osteoporosis, and GERD.    PT Comments    Pt has met all PT goals at this time. Pt is safe with ambulation using RW and is able to complete there-ex with good technique. Reviewed HEP and answered all questions. Pt is making good progress with AAROM and completed stair training. Vitals assessed: BP 148/70 and temp at 97.9. RN aware. Pt is safe to dc home this date.   Follow Up Recommendations  Home health PT     Equipment Recommendations  Rolling walker with 5" wheels    Recommendations for Other Services       Precautions / Restrictions Precautions Precautions: Knee;Fall Precaution Booklet Issued: Yes (comment) Precaution Comments: able to perform SLR, did not use KI during PT session Restrictions Weight Bearing Restrictions: Yes RLE Weight Bearing: Weight bearing as tolerated    Mobility  Bed Mobility               General bed mobility comments: not performed as pt received in reclienr  Transfers Overall transfer level: Modified independent Equipment used: Rolling walker (2 wheeled) Transfers: Sit to/from Stand Sit to Stand: Modified independent (Device/Increase time)         General transfer comment: safe technique performed with pt able to push from seated surface. Once standing, upright posture noted  Ambulation/Gait Ambulation/Gait assistance: Supervision Ambulation Distance (Feet): 225 Feet Assistive device: Rolling walker (2 wheeled) Gait Pattern/deviations: Step-through pattern     General Gait Details: ambulated with good speed and reciprocal gait pattern. Pt able to carry conversation during ambulation and reports no pain.   Stairs            Wheelchair Mobility    Modified Rankin (Stroke  Patients Only)       Balance                                            Cognition Arousal/Alertness: Awake/alert Behavior During Therapy: WFL for tasks assessed/performed Overall Cognitive Status: Within Functional Limits for tasks assessed                                        Exercises Total Joint Exercises Goniometric ROM: 0-95 degrees on R knee Other Exercises Other Exercises: Seated ther-ex performed including R LE ankle pumps, quad sets, glut sets, SLRs, hip abd/add, SAQ, and seated knee flexion stretches. All ther-ex performed x 15 reps with supervision and cues for correct technique. Other Exercises: Pt ambulated to bathroom, able to perform all hygiene independently. Safe technique with RW.    General Comments        Pertinent Vitals/Pain Pain Assessment: 0-10 Pain Score: 2  Pain Location: R knee Pain Descriptors / Indicators: Aching Pain Intervention(s): Limited activity within patient's tolerance;RN gave pain meds during session;Ice applied    Home Living                      Prior Function            PT Goals (current goals can now be found in the care plan section) Acute  Rehab PT Goals Patient Stated Goal: go home PT Goal Formulation: With patient Time For Goal Achievement: 07/14/16 Potential to Achieve Goals: Good Progress towards PT goals: Progressing toward goals    Frequency    BID      PT Plan Current plan remains appropriate    Co-evaluation             End of Session Equipment Utilized During Treatment: Gait belt Activity Tolerance: Patient tolerated treatment well Patient left: in chair Nurse Communication: Mobility status PT Visit Diagnosis: Muscle weakness (generalized) (M62.81);Difficulty in walking, not elsewhere classified (R26.2)     Time: 5927-6394 PT Time Calculation (min) (ACUTE ONLY): 23 min  Charges:  $Gait Training: 8-22 mins $Therapeutic Exercise: 8-22 mins                     G Codes:       Greggory Stallion, PT, DPT [phone]    Angelia Hazell 07/01/2016, 10:05 AM

## 2016-07-01 NOTE — Progress Notes (Signed)
Patient is A+O with no signs of distress. Pain is controlled with current medications. ambulates to BR.  Will give milk of mag this am.

## 2016-07-03 ENCOUNTER — Other Ambulatory Visit: Payer: Self-pay | Admitting: *Deleted

## 2016-07-03 DIAGNOSIS — M810 Age-related osteoporosis without current pathological fracture: Secondary | ICD-10-CM | POA: Diagnosis not present

## 2016-07-03 DIAGNOSIS — I10 Essential (primary) hypertension: Secondary | ICD-10-CM | POA: Diagnosis not present

## 2016-07-03 DIAGNOSIS — Z96651 Presence of right artificial knee joint: Secondary | ICD-10-CM | POA: Diagnosis not present

## 2016-07-03 DIAGNOSIS — Z8781 Personal history of (healed) traumatic fracture: Secondary | ICD-10-CM | POA: Diagnosis not present

## 2016-07-03 DIAGNOSIS — J449 Chronic obstructive pulmonary disease, unspecified: Secondary | ICD-10-CM | POA: Diagnosis not present

## 2016-07-03 DIAGNOSIS — Z471 Aftercare following joint replacement surgery: Secondary | ICD-10-CM | POA: Diagnosis not present

## 2016-07-03 NOTE — Patient Outreach (Signed)
[facility]) Care Management  07/03/2016  Amber Perez 1945/07/07 [id number]  Referral from [facility] member -recent discharge 3/28 from [facility]:  Telephone call attempt x 1; left message on voice mail requesting call back.  Plan: Will follow up.  Sherrin Daisy, RN BSN [place] Management Coordinator [facility] Care Management  [phone]

## 2016-07-06 ENCOUNTER — Other Ambulatory Visit: Payer: Self-pay | Admitting: Family Medicine

## 2016-07-06 ENCOUNTER — Encounter: Payer: Self-pay | Admitting: *Deleted

## 2016-07-06 ENCOUNTER — Other Ambulatory Visit: Payer: Self-pay | Admitting: *Deleted

## 2016-07-06 DIAGNOSIS — Z471 Aftercare following joint replacement surgery: Secondary | ICD-10-CM | POA: Diagnosis not present

## 2016-07-06 DIAGNOSIS — J449 Chronic obstructive pulmonary disease, unspecified: Secondary | ICD-10-CM | POA: Diagnosis not present

## 2016-07-06 DIAGNOSIS — I10 Essential (primary) hypertension: Secondary | ICD-10-CM | POA: Diagnosis not present

## 2016-07-06 DIAGNOSIS — Z8781 Personal history of (healed) traumatic fracture: Secondary | ICD-10-CM | POA: Diagnosis not present

## 2016-07-06 DIAGNOSIS — Z96651 Presence of right artificial knee joint: Secondary | ICD-10-CM | POA: Diagnosis not present

## 2016-07-06 DIAGNOSIS — M810 Age-related osteoporosis without current pathological fracture: Secondary | ICD-10-CM | POA: Diagnosis not present

## 2016-07-06 NOTE — Patient Outreach (Signed)
Amber Perez) Care Management  07/06/2016  Amber Perez [birth year] [id number]  Transition of Care call attempt x 2 :  Spoke with patient who was advised of reason for call> Patient gave HIPPA verification.  Patient advised that she was hospitalized for right knee replacement at [facility] from 3/26-3/28/2018. States surgery went well & she is recovering well at home. States she lives alone but someone has been staying with her every night since discharged.  Voices that her daughter is a Designer, jewellery and that she is giving her Lovenox injections. States she manages her own medications and has not had any problems  getting her prescriptions filled.   Voices that she has already gotten a call from [place] office who advised her to stop taking Ibuprofen since she was blood thinner.  States she will call today regarding appointment for hospital follow up appointment with primary care provider.  Also plans to call insurance customer service to inquire about  transportation benefit that she may have.    Advises that she is receiving home health physical therapy from [facility] and that 2nd appointment with them is today.  States she is getting  around well & uses walker as needed.  States follow up appointment with orthopedic office is scheduled for 4/10 and 08/11/2016.   States she is doing well; able to handle own personal care & family is providing all of her meals. Voices she has assistance from her daughter who is a Designer, jewellery. States she has no health concerns or case management needs at this time.   Plan: Send contact information for [facility] to patient. Close case & send to care management assistant.  Sherrin Daisy, RN BSN [place] Management Coordinator [facility] Care Management  [phone]

## 2016-07-07 ENCOUNTER — Ambulatory Visit: Payer: Self-pay | Admitting: *Deleted

## 2016-07-08 DIAGNOSIS — M810 Age-related osteoporosis without current pathological fracture: Secondary | ICD-10-CM | POA: Diagnosis not present

## 2016-07-08 DIAGNOSIS — Z471 Aftercare following joint replacement surgery: Secondary | ICD-10-CM | POA: Diagnosis not present

## 2016-07-08 DIAGNOSIS — Z96651 Presence of right artificial knee joint: Secondary | ICD-10-CM | POA: Diagnosis not present

## 2016-07-08 DIAGNOSIS — I10 Essential (primary) hypertension: Secondary | ICD-10-CM | POA: Diagnosis not present

## 2016-07-08 DIAGNOSIS — Z8781 Personal history of (healed) traumatic fracture: Secondary | ICD-10-CM | POA: Diagnosis not present

## 2016-07-08 DIAGNOSIS — J449 Chronic obstructive pulmonary disease, unspecified: Secondary | ICD-10-CM | POA: Diagnosis not present

## 2016-07-09 ENCOUNTER — Other Ambulatory Visit: Payer: Self-pay

## 2016-07-09 NOTE — Patient Outreach (Signed)
[facility]) Care Management  07/09/2016  Amber Perez 03/20/1946 [id number]     EMMI-GENERAL DISCHARGE RED ON EMMI ALERT Day # 4 Date: 07/08/16 Red Alert Reason:" Lost interest in things? Yes"    Outreach attempt #1 to patient. No answer at present. RN CM left HIPAA compliant voicemail message along with contact info.     Plan:  Assigned RN CM will make outreach attempt to patient within three business if no return call from patient.    Enzo Montgomery, RN,BSN,CCM [place] Management Telephonic Care Management Coordinator Direct Phone: [phone] Toll Free: [phone] Fax: [phone]

## 2016-07-09 NOTE — Patient Outreach (Signed)
[facility]) Care Management  07/09/2016  Amber Perez 07-02-1945 [id number]   EMMI-GENERAL DISCHARGE RED ON EMMI ALERT Day # 4 Date: 07/08/16 Red Alert Reason:" Lost interest in things? Yes"  Voicemail message received from patient. Return call placed to patient. Discussed and reviewed red alert with patient. Patient states that machine recorded her response incorrectly. She denies having lost interest in things. She reports that her pain issue is the post op pain that she continues to experience. Pain management discussed with patient. She is taking pain meds as ordered to stay on top of pain. She goes for f/u appt on next week and plans to discuss further pain mgmt options with MD. She is also keeping knee elevated and applying ice for comfort measures. Patient states that she "regrets" having surgery after she recently did an internet search and read up on the recovery following surgery and how long it takes to"return to normal." Education and support given. Patient denies any RN CM needs or concerns. She was aappreciativeof f/u call.    Plan: RN CM will notify [facility] administrative assistant of case status.  Enzo Montgomery, RN,BSN,CCM [place] Management Telephonic Care Management Coordinator Direct Phone: [phone] Toll Free: [phone] Fax: [phone]

## 2016-07-10 DIAGNOSIS — J449 Chronic obstructive pulmonary disease, unspecified: Secondary | ICD-10-CM | POA: Diagnosis not present

## 2016-07-10 DIAGNOSIS — Z96651 Presence of right artificial knee joint: Secondary | ICD-10-CM | POA: Diagnosis not present

## 2016-07-10 DIAGNOSIS — I10 Essential (primary) hypertension: Secondary | ICD-10-CM | POA: Diagnosis not present

## 2016-07-10 DIAGNOSIS — M810 Age-related osteoporosis without current pathological fracture: Secondary | ICD-10-CM | POA: Diagnosis not present

## 2016-07-10 DIAGNOSIS — Z8781 Personal history of (healed) traumatic fracture: Secondary | ICD-10-CM | POA: Diagnosis not present

## 2016-07-10 DIAGNOSIS — Z471 Aftercare following joint replacement surgery: Secondary | ICD-10-CM | POA: Diagnosis not present

## 2016-07-13 DIAGNOSIS — Z8781 Personal history of (healed) traumatic fracture: Secondary | ICD-10-CM | POA: Diagnosis not present

## 2016-07-13 DIAGNOSIS — M810 Age-related osteoporosis without current pathological fracture: Secondary | ICD-10-CM | POA: Diagnosis not present

## 2016-07-13 DIAGNOSIS — Z96651 Presence of right artificial knee joint: Secondary | ICD-10-CM | POA: Diagnosis not present

## 2016-07-13 DIAGNOSIS — Z471 Aftercare following joint replacement surgery: Secondary | ICD-10-CM | POA: Diagnosis not present

## 2016-07-13 DIAGNOSIS — J449 Chronic obstructive pulmonary disease, unspecified: Secondary | ICD-10-CM | POA: Diagnosis not present

## 2016-07-13 DIAGNOSIS — I10 Essential (primary) hypertension: Secondary | ICD-10-CM | POA: Diagnosis not present

## 2016-07-14 DIAGNOSIS — M25561 Pain in right knee: Secondary | ICD-10-CM | POA: Diagnosis not present

## 2016-07-14 DIAGNOSIS — R29898 Other symptoms and signs involving the musculoskeletal system: Secondary | ICD-10-CM | POA: Diagnosis not present

## 2016-07-14 DIAGNOSIS — M25661 Stiffness of right knee, not elsewhere classified: Secondary | ICD-10-CM | POA: Diagnosis not present

## 2016-07-14 DIAGNOSIS — Z96651 Presence of right artificial knee joint: Secondary | ICD-10-CM | POA: Diagnosis not present

## 2016-07-17 DIAGNOSIS — Z96651 Presence of right artificial knee joint: Secondary | ICD-10-CM | POA: Diagnosis not present

## 2016-07-20 DIAGNOSIS — Z96651 Presence of right artificial knee joint: Secondary | ICD-10-CM | POA: Diagnosis not present

## 2016-07-22 DIAGNOSIS — Z96651 Presence of right artificial knee joint: Secondary | ICD-10-CM | POA: Diagnosis not present

## 2016-07-27 DIAGNOSIS — Z96651 Presence of right artificial knee joint: Secondary | ICD-10-CM | POA: Diagnosis not present

## 2016-07-27 DIAGNOSIS — M25561 Pain in right knee: Secondary | ICD-10-CM | POA: Diagnosis not present

## 2016-07-27 DIAGNOSIS — M25661 Stiffness of right knee, not elsewhere classified: Secondary | ICD-10-CM | POA: Diagnosis not present

## 2016-07-27 DIAGNOSIS — R29898 Other symptoms and signs involving the musculoskeletal system: Secondary | ICD-10-CM | POA: Diagnosis not present

## 2016-07-29 DIAGNOSIS — Z96651 Presence of right artificial knee joint: Secondary | ICD-10-CM | POA: Diagnosis not present

## 2016-08-03 DIAGNOSIS — Z96651 Presence of right artificial knee joint: Secondary | ICD-10-CM | POA: Diagnosis not present

## 2016-08-04 DIAGNOSIS — Z471 Aftercare following joint replacement surgery: Secondary | ICD-10-CM | POA: Diagnosis not present

## 2016-08-05 DIAGNOSIS — Z96651 Presence of right artificial knee joint: Secondary | ICD-10-CM | POA: Diagnosis not present

## 2016-08-10 DIAGNOSIS — Z96651 Presence of right artificial knee joint: Secondary | ICD-10-CM | POA: Diagnosis not present

## 2016-08-12 DIAGNOSIS — Z96651 Presence of right artificial knee joint: Secondary | ICD-10-CM | POA: Diagnosis not present

## 2016-08-13 DIAGNOSIS — Z96651 Presence of right artificial knee joint: Secondary | ICD-10-CM | POA: Diagnosis not present

## 2016-09-01 ENCOUNTER — Ambulatory Visit
Admission: RE | Admit: 2016-09-01 | Discharge: 2016-09-01 | Disposition: A | Discharge status: Home / Self Care | Payer: Medicare HMO | Source: Ambulatory Visit | Attending: Family Medicine | Admitting: Family Medicine

## 2016-09-01 ENCOUNTER — Other Ambulatory Visit: Payer: Self-pay

## 2016-09-01 DIAGNOSIS — R928 Other abnormal and inconclusive findings on diagnostic imaging of breast: Secondary | ICD-10-CM | POA: Insufficient documentation

## 2016-09-01 NOTE — Telephone Encounter (Signed)
Last office visit 06/08/16 No office visit scheduled  To follow up 6 months - 1 year

## 2016-09-09 NOTE — Addendum Note (Signed)
Addended by: Johna Sheriff on: 09/09/2016 04:37 PM   Modules accepted: Orders

## 2016-09-10 MED ORDER — ESTRADIOL 0.1 MG/GM VA CREA: 1.0000 | TOPICAL_CREAM | VAGINAL | 1 refills | Status: DC

## 2016-10-14 DIAGNOSIS — Z0100 Encounter for examination of eyes and vision without abnormal findings: Secondary | ICD-10-CM | POA: Diagnosis not present

## 2016-10-14 DIAGNOSIS — Z961 Presence of intraocular lens: Secondary | ICD-10-CM | POA: Diagnosis not present

## 2016-10-14 DIAGNOSIS — H26493 Other secondary cataract, bilateral: Secondary | ICD-10-CM | POA: Diagnosis not present

## 2016-11-04 ENCOUNTER — Ambulatory Visit (INDEPENDENT_AMBULATORY_CARE_PROVIDER_SITE_OTHER): Payer: Medicare HMO

## 2016-11-04 VITALS — BP 120/80 | HR 78 | Temp 98.3°F | Resp 14 | Ht 59.25 in | Wt 175.4 lb

## 2016-11-04 DIAGNOSIS — Z1159 Encounter for screening for other viral diseases: Secondary | ICD-10-CM

## 2016-11-04 DIAGNOSIS — Z0000 Encounter for general adult medical examination without abnormal findings: Secondary | ICD-10-CM | POA: Diagnosis not present

## 2016-11-04 NOTE — Progress Notes (Signed)
Subjective:   Amber Perez is a 71 y.o. female who presents for Medicare Annual (Subsequent) preventive examination.  Review of Systems:  No ROS.  Medicare Wellness Visit. Additional risk factors are reflected in the social history. Cardiac Risk Factors include: advanced age (>18mn, >>63women);hypertension;obesity (BMI >30kg/m2)     Objective:     Vitals: BP 120/80 (BP Location: Left Arm, Patient Position: Sitting, Cuff Size: Normal)   Pulse 78   Temp 98.3 F (36.8 C) (Oral)   Resp 14   Ht 4' 11.25" (1.505 m)   Wt 175 lb 6.4 oz (79.6 kg)   SpO2 96%   BMI 35.13 kg/m   Body mass index is 35.13 kg/m.   Tobacco History  Smoking Status  . Former Smoker  . Quit date: 03/18/2006  Smokeless Tobacco  . Never Used     Counseling given: Not Answered   Past Medical History:  Diagnosis Date  . Closed left arm fracture     Ortho   . COPD (chronic obstructive pulmonary disease) ([place]   . Dyslipidemia   . GERD (gastroesophageal reflux disease)   . Hypertension 2008  . Osteoarthritis   . Osteoporosis   . Pneumonia 2008  . Pre-diabetes   . Thyroid nodule   . Vitamin D deficiency    Past Surgical History:  Procedure Laterality Date  . ABDOMINAL HYSTERECTOMY  1995  . BILATERAL SALPINGOOPHORECTOMY  1995  . BREAST BIOPSY Left    neg  . BREAST SURGERY     Benign per pt's report  . CATARACT EXTRACTION  2009  . CESAREAN SECTION  13570,[id number] . COLONOSCOPY  2007  . GANGLION CYST EXCISION    . KNEE ARTHROPLASTY Right 06/29/2016   Procedure: COMPUTER ASSISTED TOTAL KNEE ARTHROPLASTY;  Surgeon: JDereck Leep MD;  Location: [facility] ORS;  Service: Orthopedics;  Laterality: Right;  . KNEE ARTHROSCOPY W/ MENISCAL REPAIR Right 04/2012   Dr. HMarry Guan . UTERINE FIBROID SURGERY     x 5   Family History  Problem Relation Age of Onset  . Anemia Mother        aplastic  . Aplastic anemia Mother   . Throat cancer Father   . Hypertension Father   . Alcohol abuse  Father   . COPD Father   . Diabetes Sister   . Esophageal cancer Brother   . Colon polyps Sister   . Diabetes Brother   . Multiple myeloma Brother   . Diabetes Brother   . Colon cancer Maternal Aunt   . Breast cancer Neg Hx    History  Sexual Activity  . Sexual activity: No    Outpatient Encounter Prescriptions as of 11/04/2016  Medication Sig  . amLODipine (NORVASC) 5 MG tablet TAKE 1 TABLET (5 MG TOTAL) BY MOUTH DAILY. (Patient taking differently: Take 5 mg by mouth at bedtime. )  . Biotin w/ Vitamins C & E (HAIR/SKIN/NAILS PO) Take 2 capsules by mouth at bedtime.  . Calcium-Magnesium (CAL-MAG PO) Take 2 tablets by mouth at bedtime.  . celecoxib (CELEBREX) 200 MG capsule Take 1 capsule (200 mg total) by mouth every 12 (twelve) hours.  . cetirizine (ZYRTEC) 10 MG tablet Take 1 tablet (10 mg total) by mouth daily. (Patient taking differently: Take 10 mg by mouth daily as needed (for allergies.). )  . Cholecalciferol (VITAMIN D3 PO) Take 2,500 Units by mouth at bedtime.  . COLLAGEN PO Take 1 Dose by mouth daily. Marine Collagen Peptide with MSM 1 scoop  a day in hot coffee.  . estradiol (ESTRACE) 0.1 MG/GM vaginal cream Place 1 Applicatorful vaginally 2 (two) times a week. Every 4-5 days.  . hydrochlorothiazide (MICROZIDE) 12.5 MG capsule Take 1 capsule (12.5 mg total) by mouth daily as needed. (Patient taking differently: Take 12.5 mg by mouth daily as needed (for swelling/fluid retention.). )  . Omega-3 Fatty Acids (FISH OIL ULTRA) 1400 MG CAPS Take 1,400 mg by mouth at bedtime.  . pantoprazole (PROTONIX) 40 MG tablet Take 1 tablet (40 mg total) by mouth daily. (Patient taking differently: Take 40 mg by mouth at bedtime. )  . triamcinolone (NASACORT AQ) 55 MCG/ACT AERO nasal inhaler Place 2 sprays into the nose as needed.  . TURMERIC PO Take 200 mg by mouth at bedtime.  . [DISCONTINUED] enoxaparin (LOVENOX) 30 MG/0.3ML injection Inject 0.4 mLs (40 mg total) into the skin daily.  .  [DISCONTINUED] oxyCODONE (OXY IR/ROXICODONE) 5 MG immediate release tablet Take 1-2 tablets (5-10 mg total) by mouth every 4 (four) hours as needed for severe pain or breakthrough pain.  . [DISCONTINUED] traMADol (ULTRAM) 50 MG tablet Take 1-2 tablets (50-100 mg total) by mouth every 4 (four) hours as needed for moderate pain.   No facility-administered encounter medications on file as of 11/04/2016.     Activities of Daily Living In your present state of health, do you have any difficulty performing the following activities: 11/04/2016 06/29/2016  Hearing? Y H. [place]? N N  Comment - -  Difficulty concentrating or making decisions? N N  Walking or climbing stairs? Y Y  Comment R knee pain, intermittent.  Hx of knee placement. -  Dressing or bathing? N N  Doing errands, shopping? N N  Preparing Food and eating ? N -  Using the Toilet? N -  In the past six months, have you accidently leaked urine? N -  Do you have problems with loss of bowel control? N -  Managing your Medications? N -  Managing your Finances? N -  Housekeeping or managing your Housekeeping? N -  Some recent data might be hidden    Patient Care Team: Coral Spikes, DO as PCP - General (Family Medicine)    Assessment:    This is a routine wellness examination for [place]. The goal of the wellness visit is to assist the patient how to close the gaps in care and create a preventative care plan for the patient.   The roster of all physicians providing medical care to patient is listed in the Snapshot section of the chart.  Taking calcium VIT D as appropriate/Osteoporosis risk reviewed.    Safety issues reviewed; Smoke and carbon monoxide detectors in the home. No firearms in the home.  Wears seatbelts when driving or riding with others. Patient does wear sunscreen or protective clothing when in direct sunlight. No violence in the home.  Depression- PHQ 2 &9 complete.  No signs/symptoms or verbal  communication regarding little pleasure in doing things, feeling down, depressed or hopeless. No changes in sleeping, energy, eating, concentrating.  No thoughts of self harm or harm towards others.  Time spent on this topic is 8 minutes.   Patient is alert, normal appearance, oriented to person/place/and time.  Correctly identified the president of the [place], recall of 3/3 words, and performing simple calculations. Displays appropriate judgement and can read correct time from watch face.   No new identified risk were noted.  No failures at ADL's or IADL's.  BMI- discussed the importance of a healthy diet, water intake and the benefits of aerobic exercise. Educational material provided.   24 hour diet recall: Breakfast: none Lunch: egg salad sandwich Dinner: taco  Daily fluid intake: 2 cups of caffeine, 2 cups of water  Dental- every 6 months. Dr. Rosalita Levan.  Eye- Visual acuity not assessed per patient preference since they have regular follow up with the ophthalmologist.  Wears corrective lenses.  Sleep patterns- Sleeps 7-8 hours at night.  Wakes feeling rested.  Naps during the day.  TDAP vaccine deferred per patient preference.  Follow up with insurance.  Educational material provided.  Hepatitis C Screening discussed, lab ordered.  Educational material provided.  Patient Concerns: None at this time. Follow up with PCP as needed.  Exercise Activities and Dietary recommendations Current Exercise Habits: Structured exercise class, Type of exercise: walking (swimming), Time (Minutes): 60, Frequency (Times/Week): 5, Weekly Exercise (Minutes/Week): 300, Intensity: Moderate  Goals    . Increase lean proteins          Low carb foods    . Increase water intake          Stay hydrated and drink plenty of water      Fall Risk Fall Risk  11/04/2016 07/06/2016 07/12/2015 06/08/2014 06/20/2013  Falls in the past year? No No No No Yes  Number falls in past yr: - - - - 1  Injury with Fall?  - - - - No  Risk Factor Category  - - - - -  Risk for fall due to : - Impaired mobility - - -   Depression Screen PHQ 2/9 Scores 11/04/2016 07/06/2016 07/12/2015 06/08/2014  PHQ - 2 Score 0 0 0 0  PHQ- 9 Score 0 - - -     Cognitive Function MMSE - Mini Mental State Exam 11/04/2016  Orientation to time 5  Orientation to Place 5  Registration 3  Attention/ Calculation 5  Recall 3  Language- name 2 objects 2  Language- repeat 1  Language- follow 3 step command 3  Language- read & follow direction 1  Write a sentence 1  Copy design 1  Total score 30        Immunization History  Administered Date(s) Administered  . Influenza Split 03/19/2011, 01/05/2012  . Influenza,inj,Quad PF,36+ Mos 12/21/2013  . Influenza-Unspecified 01/11/2015  . Pneumococcal Conjugate-13 06/08/2014  . Pneumococcal Polysaccharide-23 03/24/2012  . Tdap 05/07/2006  . Zoster 06/30/2012   Screening Tests Health Maintenance  Topic Date Due  . Hepatitis C Screening  Jul 01, 1945  . TETANUS/TDAP  05/07/2016  . INFLUENZA VACCINE  06/08/2017 (Originally 11/04/2016)  . MAMMOGRAM  09/02/2018  . COLONOSCOPY  04/30/2021  . DEXA SCAN  Completed  . PNA vac Low Risk Adult  Completed      Plan:   End of life planning; Advanced aging; Advanced directives discussed.  No HCPOA/Living Will.  Additional information declined at this time.  I have personally reviewed and noted the following in the patient's chart:   . Medical and social history . Use of alcohol, tobacco or illicit drugs  . Current medications and supplements . Functional ability and status . Nutritional status . Physical activity . Advanced directives . List of other physicians . Hospitalizations, surgeries, and ER visits in previous 12 months . Vitals . Screenings to include cognitive, depression, and falls . Referrals and appointments  In addition, I have reviewed and discussed with patient certain preventive protocols, quality metrics, and best  practice recommendations. A  written personalized care plan for preventive services as well as general preventive health recommendations were provided to patient.     Varney Biles, LPN  10/04/2456

## 2016-11-04 NOTE — Patient Instructions (Addendum)
  Amber Perez , Thank you for taking time to come for your Medicare Wellness Visit. I appreciate your ongoing commitment to your health goals. Please review the following plan we discussed and let me know if I can assist you in the future.   Follow up with Dr. Lacinda Axon as needed.    Bring a copy of your [place] and/or Living Will to be scanned into chart once completed.  Have a great day!  These are the goals we discussed: Goals    . Increase lean proteins          Low carb foods    . Increase water intake          Stay hydrated and drink plenty of water       This is a list of the screening recommended for you and due dates:  Health Maintenance  Topic Date Due  .  Hepatitis C: One time screening is recommended by Center for Disease Control  (CDC) for  adults born from [birth year] through 1964.   10-03-1945  . Tetanus Vaccine  05/07/2016  . Flu Shot  06/08/2017*  . Mammogram  09/02/2018  . Colon Cancer Screening  04/30/2021  . DEXA scan (bone density measurement)  Completed  . Pneumonia vaccines  Completed  *Topic was postponed. The date shown is not the original due date.

## 2016-11-05 ENCOUNTER — Other Ambulatory Visit: Payer: Self-pay

## 2016-11-05 DIAGNOSIS — Z760 Encounter for issue of repeat prescription: Secondary | ICD-10-CM

## 2016-11-05 LAB — HEPATITIS C ANTIBODY: HCV Ab: REACTIVE — AB

## 2016-11-05 MED ORDER — HYDROCHLOROTHIAZIDE 12.5 MG PO CAPS: 12.5000 mg | ORAL_CAPSULE | Freq: Every day | ORAL | 0 refills | Status: DC | PRN

## 2016-11-07 LAB — HEPATITIS C RNA QUANTITATIVE
HCV QUANT: NOT DETECTED [IU]/mL
HCV Quantitative Log: <1.18 Log IU/mL

## 2016-11-13 ENCOUNTER — Telehealth: Payer: Self-pay

## 2016-11-16 NOTE — Telephone Encounter (Signed)
That is correct, I just wanted to make sure you reviewed the results.  Have a great day.     Thanks, [place], CMA

## 2016-12-09 ENCOUNTER — Telehealth: Payer: Self-pay | Admitting: Family Medicine

## 2016-12-09 NOTE — Telephone Encounter (Signed)
Pt called and is asking for pain medication. Pt had knee replacement surgery back in March. She states that she jammed it getting into the car a couple of weeks ago. Please advise, thank you!  Call pt @ [phone]  Pharmacy - [id number] [place], [place]

## 2016-12-09 NOTE — Telephone Encounter (Signed)
Pt had total right knee replacement 06/30/2016. She fell on her knees getting out of a vehicle. Knee has some swelling, no redness. No issues with range of motion or bearing weight. Patient is requesting something to help with pain (doesnt have to be narcotic.) Patient has been alternating tylenol and ibuprofen. Please advise.

## 2016-12-10 NOTE — Telephone Encounter (Signed)
Spoke with patient states Celebrex relieving pain some.  Per Dr Lacinda Axon would need to come in for appointment to be evaluated. Appointment scheduled.

## 2016-12-10 NOTE — Telephone Encounter (Signed)
Celebrex has not helped?

## 2016-12-14 ENCOUNTER — Ambulatory Visit (INDEPENDENT_AMBULATORY_CARE_PROVIDER_SITE_OTHER): Payer: Medicare HMO | Admitting: Family Medicine

## 2016-12-14 ENCOUNTER — Encounter: Payer: Self-pay | Admitting: Family Medicine

## 2016-12-14 DIAGNOSIS — M25561 Pain in right knee: Secondary | ICD-10-CM

## 2016-12-14 MED ORDER — TRAMADOL HCL 50 MG PO TABS: 50.0000 mg | ORAL_TABLET | Freq: Three times a day (TID) | ORAL | 0 refills | Status: DC | PRN

## 2016-12-14 NOTE — Patient Instructions (Signed)
Celebrex and Tramadol. Follow up with [facility].  Take care  Dr. Lacinda Axon

## 2016-12-14 NOTE — Assessment & Plan Note (Signed)
New acute pain. Treating with Tramadol. Advised Celebrex as prescribed.

## 2016-12-14 NOTE — Progress Notes (Signed)
Subjective:  Patient ID: Amber Perez, female    DOB: [birth year]  Age: 71 y.o. MRN: [id number]  CC: Knee pain  HPI:  71 year old female presents with knee pain.  Patient reports that she injured her right knee approximate 6 weeks post knee replacement. She states that this was done in May of this year. She was coming out of a truck and "jammed" her knee. She's had pain intermittently since that time. Sharp. Range of motion improving. She takes ibuprofen with some improvement. She has not been taking her Celebrex regularly. Some swelling. No erythema.  Social Hx   Social History   Social History  . Marital status: Divorced    Spouse name: N/A  . Number of children: 2  . Years of education: N/A   Occupational History  . 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 Smoking status: Former Smoker    Quit date: 03/18/2006  . Smokeless tobacco: Never Used  . Alcohol use 0.0 - 1.2 oz/week     Comment: Occasional glass of wine, sometimes 2 in a week, sometimes 1 every couple of months.  . Drug use: No  . Sexual activity: No   Other Topics Concern  . None   Social History Narrative   Regular Exercise -  Occasional   Daily Caffeine Use:  2 cups coffee & Diet Mt Dew    Review of Systems  Constitutional: Negative.   Musculoskeletal:       Right knee pain, swelling.   Objective:  BP 130/82 (BP Location: Left Arm, Patient Position: Sitting, Cuff Size: Normal)   Pulse 78   Temp 98 F (36.7 C) (Oral)   Resp 18   Wt 179 lb (81.2 kg)   SpO2 98%   BMI 35.85 kg/m   BP/Weight 12/14/2016 11/04/2016 09/05/930  Systolic BP 355 732 202  Diastolic BP 82 80 79  Wt. (Lbs) 179 175.4 -  BMI 35.85 35.13 -   Physical Exam  Constitutional: She is oriented to person, place, and time. She appears well-developed. No distress.  Pulmonary/Chest: Effort normal. She has no wheezes. She has no rales.  Musculoskeletal:  Right knee - mild swelling noted. Medial joint  line tenderness noted. Good range of motion. No erythema.  Neurological: She is alert and oriented to person, place, and time.  Psychiatric: She has a normal mood and affect.  Vitals reviewed.   Lab Results  Component Value Date   WBC 10.9 07/01/2016   HGB 11.7 (L) 07/01/2016   HCT 34.4 (L) 07/01/2016   PLT 234 07/01/2016   GLUCOSE 135 (H) 07/01/2016   CHOL 198 06/08/2016   TRIG 119.0 06/08/2016   HDL 50.80 06/08/2016   LDLDIRECT 127.1 11/25/2011   LDLCALC 123 (H) 06/08/2016   ALT 30 06/17/2016   AST 28 06/17/2016   NA 137 07/01/2016   K 3.2 (L) 07/01/2016   CL 103 07/01/2016   CREATININE 0.64 07/01/2016   BUN 14 07/01/2016   CO2 29 07/01/2016   TSH 1.68 06/08/2014   INR 0.91 06/17/2016   HGBA1C 6.3 06/08/2016   MICROALBUR 0.5 07/12/2015    Assessment & Plan:   Problem List Items Addressed This Visit    Right knee pain    New acute pain. Treating with Tramadol. Advised Celebrex as prescribed.          Meds ordered this encounter  Medications  . traMADol (ULTRAM) 50 MG tablet    Sig:  Take 1 tablet (50 mg total) by mouth every 8 (eight) hours as needed.    Dispense:  90 tablet    Refill:  0    Follow-up: PRN  [place]

## 2016-12-30 ENCOUNTER — Encounter: Payer: Self-pay | Admitting: Family Medicine

## 2016-12-30 MED ORDER — CELECOXIB 200 MG PO CAPS: 200.0000 mg | ORAL_CAPSULE | Freq: Two times a day (BID) | ORAL | 6 refills | Status: DC

## 2017-01-12 ENCOUNTER — Other Ambulatory Visit: Payer: Self-pay | Admitting: *Deleted

## 2017-01-12 DIAGNOSIS — K219 Gastro-esophageal reflux disease without esophagitis: Secondary | ICD-10-CM

## 2017-01-12 NOTE — Telephone Encounter (Signed)
Pt has requested a medication refill for amlodipine , pantoprazole and tramadol  90 day supply  *pt will est with provider in March as she requested  Pharmacy [facility] mail order

## 2017-01-12 NOTE — Telephone Encounter (Signed)
Last OV 12/14/16 with Dr.Cook, last filled Tramadol 12/14/16 90 0rf Pantoprazole 01/22/16 90 3rf Amlodipine 01/22/16 90 3rf

## 2017-01-13 NOTE — Telephone Encounter (Signed)
Please see what the patient takes the tramadol for. Other refills okay. She additionally needs to establish care.

## 2017-01-15 NOTE — Telephone Encounter (Signed)
Left message to return call 

## 2017-02-15 NOTE — Telephone Encounter (Signed)
Sent my chart message, unable to reach per phone

## 2017-03-10 DIAGNOSIS — Z872 Personal history of diseases of the skin and subcutaneous tissue: Secondary | ICD-10-CM | POA: Diagnosis not present

## 2017-03-10 DIAGNOSIS — B353 Tinea pedis: Secondary | ICD-10-CM | POA: Diagnosis not present

## 2017-03-10 DIAGNOSIS — L578 Other skin changes due to chronic exposure to nonionizing radiation: Secondary | ICD-10-CM | POA: Diagnosis not present

## 2017-03-10 DIAGNOSIS — L814 Other melanin hyperpigmentation: Secondary | ICD-10-CM | POA: Diagnosis not present

## 2017-03-26 NOTE — Telephone Encounter (Signed)
Left message to return call, we had received refill request for patient in October and were not able to reach patient to ask what she is taking tramadol for, patient called back today, left message to return call, ok for pec to speak to patient

## 2017-03-27 ENCOUNTER — Telehealth: Payer: Self-pay | Admitting: Family Medicine

## 2017-03-27 NOTE — Telephone Encounter (Signed)
Copied from [place] [id number]. Topic: Quick Communication - Office Called Patient >> Mar 26, 2017  4:37 PM Juanda Chance, [place] wrote: Reason for CRM: Left message to return call, we had received refill request for patient in October and were not able to reach patient to ask what she is taking tramadol for, patient called back today, left message to return call, ok for pec to speak to patient >> Mar 26, 2017  4:40 PM Oliver Pila B wrote: Pt states she is taking tramadol b/c of knee pain from knee surgery, contact pt further for more info if needed

## 2017-03-29 ENCOUNTER — Other Ambulatory Visit: Payer: Self-pay

## 2017-03-29 DIAGNOSIS — K219 Gastro-esophageal reflux disease without esophagitis: Secondary | ICD-10-CM

## 2017-03-29 MED ORDER — AMLODIPINE BESYLATE 5 MG PO TABS: ORAL_TABLET | ORAL | 3 refills | Status: DC

## 2017-03-29 MED ORDER — PANTOPRAZOLE SODIUM 40 MG PO TBEC: 40.0000 mg | DELAYED_RELEASE_TABLET | Freq: Every day | ORAL | 3 refills | Status: DC

## 2017-03-29 MED ORDER — TRAMADOL HCL 50 MG PO TABS: 50.0000 mg | ORAL_TABLET | Freq: Three times a day (TID) | ORAL | 0 refills | Status: DC | PRN

## 2017-03-29 NOTE — Telephone Encounter (Signed)
fyi

## 2017-03-29 NOTE — Telephone Encounter (Signed)
Rx [facility] to [facility]

## 2017-03-29 NOTE — Telephone Encounter (Signed)
Last OV 12/14/16 with Dr.Cook last filled  Amlodipine 01/22/16 90 3rf Pantoprazole 01/22/16 90 3rf Patient would like 90 day refills, has been scheduled for appointment

## 2017-03-29 NOTE — Telephone Encounter (Signed)
Noted.  Refill given.

## 2017-06-23 ENCOUNTER — Encounter: Payer: Self-pay | Admitting: Family Medicine

## 2017-06-23 ENCOUNTER — Other Ambulatory Visit: Payer: Self-pay

## 2017-06-23 ENCOUNTER — Ambulatory Visit (INDEPENDENT_AMBULATORY_CARE_PROVIDER_SITE_OTHER): Payer: Medicare HMO

## 2017-06-23 ENCOUNTER — Ambulatory Visit (INDEPENDENT_AMBULATORY_CARE_PROVIDER_SITE_OTHER): Payer: Medicare HMO | Admitting: Family Medicine

## 2017-06-23 VITALS — BP 140/90 | HR 85 | Temp 98.0°F | Wt 185.0 lb

## 2017-06-23 DIAGNOSIS — S299XXA Unspecified injury of thorax, initial encounter: Secondary | ICD-10-CM | POA: Diagnosis not present

## 2017-06-23 DIAGNOSIS — R0781 Pleurodynia: Secondary | ICD-10-CM

## 2017-06-23 MED ORDER — TRAMADOL HCL 50 MG PO TABS: 50.0000 mg | ORAL_TABLET | Freq: Three times a day (TID) | ORAL | 0 refills | Status: DC | PRN

## 2017-06-23 NOTE — Patient Instructions (Signed)
Nice to see you. Will get an x-ray to evaluate her ribs.  We will then determine the best pain medication for you. Please try to take deep breaths periodically at home every 15-20 minutes.  This will help limit your risk of getting a lung infection. If you start to cough up blood, have trouble breathing, or develop fever or worsening pain please be evaluated immediately.

## 2017-06-23 NOTE — Addendum Note (Signed)
Addended by: Leone Haven on: 06/23/2017 06:50 PM   Modules accepted: Orders

## 2017-06-23 NOTE — Progress Notes (Signed)
  [name], MD Phone: [phone]  Amber Perez is a 72 y.o. female who presents today for same-day visit.  Patient reports a fall on 06/20/17.  She was walking through her house and slipped on a piece of plastic.  She fell into her dining room table on her left ribs.  She notes no head injury or loss of consciousness.  Notes her left ribs have been bruised and felt uncomfortable.  Coughing hurts.  Moving in certain directions hurts.  No trouble breathing.  No fevers.  No other recent falls.  Social History   Tobacco Use  Smoking Status Former Smoker  . Last attempt to quit: 03/18/2006  . Years since quitting: 11.2  Smokeless Tobacco Never Used     ROS see history of present illness  Objective  Physical Exam Vitals:   06/23/17 1339  BP: 140/90  Pulse: 85  Temp: 98 F (36.7 C)  SpO2: 98%    BP Readings from Last 3 Encounters:  06/23/17 140/90  12/14/16 130/82  11/04/16 120/80   Wt Readings from Last 3 Encounters:  06/23/17 185 lb (83.9 kg)  12/14/16 179 lb (81.2 kg)  11/04/16 175 lb 6.4 oz (79.6 kg)    Physical Exam  Constitutional: No distress.  Cardiovascular: Normal rate, regular rhythm and normal heart sounds.  Pulmonary/Chest: Effort normal and breath sounds normal.    Musculoskeletal: She exhibits no edema.  Neurological: She is alert. Gait normal.  Skin: Skin is warm and dry. She is not diaphoretic.     Assessment/Plan: Please see individual problem list.  Rib pain on left side Patient with fall and subsequent rib pain.  No obvious findings for rib fracture on exam though will obtain an x-ray to evaluate further.  X-ray will determine pain regimen.  Discussed taking deep breaths frequently to limit risk of getting infection.  Given return precautions.   Orders Placed This Encounter  Procedures  . DG Ribs Unilateral W/Chest Left    Standing Status:   Future    Number of Occurrences:   1    Standing Expiration Date:   08/24/2018   Order Specific Question:   Reason for Exam (SYMPTOM  OR DIAGNOSIS REQUIRED)    Answer:   fall on to left ribs with continued discomfort and overlying bruising, normal lung sounds    Order Specific Question:   Preferred imaging location?    Answer:   [facility] Specific Question:   Radiology Contrast Protocol - do NOT remove file path    Answer:   [email or web address]    No orders of the defined types were placed in this encounter.    [name], MD [place]

## 2017-06-23 NOTE — Assessment & Plan Note (Signed)
Patient with fall and subsequent rib pain.  No obvious findings for rib fracture on exam though will obtain an x-ray to evaluate further.  X-ray will determine pain regimen.  Discussed taking deep breaths frequently to limit risk of getting infection.  Given return precautions.

## 2017-06-24 NOTE — Telephone Encounter (Signed)
Can you change this to having her see me? I know we talked about leaving it with her seeing Dr McLean-Scocuzza, though it will likely simplify things if we switch her back to seeing me. Can you also let her know? Thanks.

## 2017-06-24 NOTE — Progress Notes (Signed)
Patient states she ran out of what she had from before. She states she did get the 10 you called in but she usually got 90 tablets before from [facility]

## 2017-06-26 MED ORDER — TRAMADOL HCL 50 MG PO TABS: 50.0000 mg | ORAL_TABLET | Freq: Three times a day (TID) | ORAL | 0 refills | Status: DC | PRN

## 2017-06-26 NOTE — Addendum Note (Signed)
Addended by: Caryl Bis, Jerelene Salaam G on: 06/26/2017 11:12 AM   Modules accepted: Orders

## 2017-06-26 NOTE — Progress Notes (Signed)
Noted regarding her tramadol.  I will send in a refill for 90 tablets.

## 2017-07-29 DIAGNOSIS — Z96651 Presence of right artificial knee joint: Secondary | ICD-10-CM | POA: Diagnosis not present

## 2017-08-02 ENCOUNTER — Encounter: Payer: Medicare HMO | Admitting: Internal Medicine

## 2017-08-04 ENCOUNTER — Encounter: Payer: Self-pay | Admitting: Family Medicine

## 2017-08-04 ENCOUNTER — Ambulatory Visit (INDEPENDENT_AMBULATORY_CARE_PROVIDER_SITE_OTHER): Payer: Medicare HMO | Admitting: Family Medicine

## 2017-08-04 VITALS — BP 138/90 | HR 84 | Temp 98.1°F | Ht 59.0 in | Wt 180.8 lb

## 2017-08-04 DIAGNOSIS — M8580 Other specified disorders of bone density and structure, unspecified site: Secondary | ICD-10-CM

## 2017-08-04 DIAGNOSIS — I10 Essential (primary) hypertension: Secondary | ICD-10-CM

## 2017-08-04 DIAGNOSIS — Z0001 Encounter for general adult medical examination with abnormal findings: Secondary | ICD-10-CM | POA: Insufficient documentation

## 2017-08-04 DIAGNOSIS — Z1239 Encounter for other screening for malignant neoplasm of breast: Secondary | ICD-10-CM

## 2017-08-04 DIAGNOSIS — Z6836 Body mass index (BMI) 36.0-36.9, adult: Secondary | ICD-10-CM | POA: Diagnosis not present

## 2017-08-04 DIAGNOSIS — N952 Postmenopausal atrophic vaginitis: Secondary | ICD-10-CM | POA: Diagnosis not present

## 2017-08-04 DIAGNOSIS — Z1231 Encounter for screening mammogram for malignant neoplasm of breast: Secondary | ICD-10-CM

## 2017-08-04 DIAGNOSIS — E785 Hyperlipidemia, unspecified: Secondary | ICD-10-CM

## 2017-08-04 DIAGNOSIS — Z0000 Encounter for general adult medical examination without abnormal findings: Secondary | ICD-10-CM | POA: Insufficient documentation

## 2017-08-04 DIAGNOSIS — Z130 Encounter for screening for diseases of the blood and blood-forming organs and certain disorders involving the immune mechanism: Secondary | ICD-10-CM | POA: Diagnosis not present

## 2017-08-04 LAB — CBC
HCT: 40.6 % (ref 36.0–46.0)
HEMOGLOBIN: 13.6 g/dL (ref 12.0–15.0)
MCHC: 33.5 g/dL (ref 30.0–36.0)
MCV: 89 fl (ref 78.0–100.0)
Platelets: 263 10*3/uL (ref 150.0–400.0)
RBC: 4.57 Mil/uL (ref 3.87–5.11)
RDW: 14.5 % (ref 11.5–15.5)
WBC: 6.6 10*3/uL (ref 4.0–10.5)

## 2017-08-04 LAB — HEMOGLOBIN A1C: HEMOGLOBIN A1C: 6.2 % (ref 4.6–6.5)

## 2017-08-04 LAB — LIPID PANEL
CHOLESTEROL: 191 mg/dL (ref 0–200)
HDL: 55.4 mg/dL (ref >39.00)
LDL Cholesterol: 113 mg/dL — ABNORMAL HIGH (ref 0–99)
NonHDL: 135.76
TRIGLYCERIDES: 115 mg/dL (ref 0.0–149.0)
Total CHOL/HDL Ratio: 3
VLDL: 23 mg/dL (ref 0.0–40.0)

## 2017-08-04 LAB — COMPREHENSIVE METABOLIC PANEL
ALBUMIN: 4.2 g/dL (ref 3.5–5.2)
ALT: 29 U/L (ref 0–35)
AST: 23 U/L (ref 0–37)
Alkaline Phosphatase: 61 U/L (ref 39–117)
BILIRUBIN TOTAL: 0.5 mg/dL (ref 0.2–1.2)
BUN: 15 mg/dL (ref 6–23)
CALCIUM: 9.4 mg/dL (ref 8.4–10.5)
CO2: 28 mEq/L (ref 19–32)
CREATININE: 0.71 mg/dL (ref 0.40–1.20)
Chloride: 103 mEq/L (ref 96–112)
GFR: 86.39 mL/min (ref >60.00)
Glucose, Bld: 93 mg/dL (ref 70–99)
Potassium: 4 mEq/L (ref 3.5–5.1)
Sodium: 138 mEq/L (ref 135–145)
Total Protein: 7.2 g/dL (ref 6.0–8.3)

## 2017-08-04 LAB — TSH: TSH: 0.95 u[IU]/mL (ref 0.35–4.50)

## 2017-08-04 MED ORDER — TETANUS-DIPHTH-ACELL PERTUSSIS 5-2.5-18.5 LF-MCG/0.5 IM SUSP: 0.5000 mL | Freq: Once | INTRAMUSCULAR | 0 refills | Status: AC

## 2017-08-04 MED ORDER — ESTROGENS, CONJUGATED 0.625 MG/GM VA CREA: 1.0000 | TOPICAL_CREAM | VAGINAL | 1 refills | Status: DC

## 2017-08-04 NOTE — Assessment & Plan Note (Signed)
Patient notes this is well controlled with topical treatment.  She requests refill of Premarin.  Also she requests a letter sent to her pharmacy outlining her benefit from this.

## 2017-08-04 NOTE — Patient Instructions (Signed)
Nice to see you. Please continue to work on exercise changes and monitor your diet. We will get you set up for lung cancer screening. We will contact you with your lab results. We will create a letter regarding her medications for your insurance company.

## 2017-08-04 NOTE — Assessment & Plan Note (Signed)
Physical exam completed.  Encouraged continued dietary changes and exercise.  Mammogram scheduled.  Colonoscopy up-to-date.  Prescription given for Tdap.  Encouraged her to consider Shingrix.  We will send a message to [facility] to get her set up for lung cancer screening.  Lab work as outlined below.

## 2017-08-04 NOTE — Progress Notes (Signed)
Amber Rumps, MD Phone: [phone]  Amber Perez is a 72 y.o. female who presents today for cpe.  Exercising by going to water aerobics 3 times a week.  She has increased this. Eats fairly healthfully with lots of fruits and vegetables.  Not many fried or fatty foods.  No soda or sweet tea. Mammogram will be due at the end of the month. Colonoscopy is up-to-date. She is status post complete hysterectomy and BSO.  She does use Premarin for vaginal atrophy symptoms.  As long as she uses that she has no symptoms.  Tetanus vaccination is due.  Pneumonia vaccination up-to-date. DEXA scan due. Hepatitis C testing up-to-date. Quit smoking 12 years ago and smoked for 37 years.  Up to 2 packs/day. No illicit drug use.  2-3 alcoholic beverages a week. Sees a dentist twice a year.  Ophthalmologist once a year.  Active Ambulatory Problems    Diagnosis Date Noted  . GERD (gastroesophageal reflux disease) 03/19/2011  . Allergic rhinitis 03/19/2011  . Thyroid nodule 11/25/2011  . Osteopenia 11/25/2011  . Hyperlipidemia 11/25/2011  . Atrophic vaginitis 03/24/2012  . Essential hypertension 01/25/2014  . Obesity (BMI 30-39.9) 01/25/2014  . S/P total knee arthroplasty 06/29/2016  . Right knee pain 12/14/2016  . Rib pain on left side 06/23/2017  . Routine general medical examination at a health care facility 08/04/2017   Resolved Ambulatory Problems    Diagnosis Date Noted  . Osteoporosis 03/19/2011  . Welcome to Medicare preventive visit 03/24/2012  . Osteoarthritis of right knee 03/24/2012  . Medicare annual wellness visit, subsequent 06/20/2013  . Family history of diabetes mellitus 06/20/2013  . Elevated BP 12/21/2013  . Elevated LFTs 12/21/2013  . Acute maxillary sinusitis 06/21/2014   Past Medical History:  Diagnosis Date  . Closed left arm fracture   . COPD (chronic obstructive pulmonary disease) ([place])   . Dyslipidemia   . GERD (gastroesophageal reflux disease)   .  Hypertension 2008  . Osteoarthritis   . Osteoporosis   . Pneumonia 2008  . Pre-diabetes   . Thyroid nodule   . Vitamin D deficiency     Family History  Problem Relation Age of Onset  . Anemia Mother        aplastic  . Aplastic anemia Mother   . Throat cancer Father   . Hypertension Father   . Alcohol abuse Father   . COPD Father   . Diabetes Sister   . Esophageal cancer Brother   . Colon polyps Sister   . Diabetes Brother   . Multiple myeloma Brother   . Diabetes Brother   . Colon cancer Maternal Aunt   . Breast cancer Neg Hx     Social History   Socioeconomic History  . Marital status: Divorced    Spouse name: Not on file  . Number of children: 2  . Years of education: Not on file  . Highest education level: Not on file  Occupational History  . Occupation: Retired - Designer, jewellery  Social Needs  . Financial resource strain: Not on file  . Food insecurity:    Worry: Not on file    Inability: Not on file  . Transportation needs:    Medical: Not on file    Non-medical: Not on file  Tobacco Use  . Smoking status: Former Smoker    Last attempt to quit: 03/18/2006    Years since quitting: 11.3  . Smokeless tobacco: Never Used  Substance and Sexual Activity  .  Alcohol use: Yes    Alcohol/week: 0.0 - 1.2 oz    Comment: Occasional glass of wine, sometimes 2 in a week, sometimes 1 every couple of months.  . Drug use: No  . Sexual activity: Never  Lifestyle  . Physical activity:    Days per week: Not on file    Minutes per session: Not on file  . Stress: Not on file  Relationships  . Social connections:    Talks on phone: Not on file    Gets together: Not on file    Attends religious service: Not on file    Active member of club or organization: Not on file    Attends meetings of clubs or organizations: Not on file    Relationship status: Not on file  . Intimate partner violence:    Fear of current or ex partner: Not on file    Emotionally abused:  Not on file    Physically abused: Not on file    Forced sexual activity: Not on file  Other Topics Concern  . Not on file  Social History Narrative   Regular Exercise -  Occasional   Daily Caffeine Use:  2 cups coffee & Diet Mt Dew    ROS  General:  Negative for nexplained weight loss, fever Skin: Negative for new or changing mole, sore that won't heal HEENT: Negative for trouble hearing, trouble seeing, ringing in ears, mouth sores, hoarseness, change in voice, dysphagia. CV:  Negative for chest pain, dyspnea, edema, palpitations Resp: Negative for cough, dyspnea, hemoptysis GI: Negative for nausea, vomiting, diarrhea, constipation, abdominal pain, melena, hematochezia. GU: Negative for dysuria, incontinence, urinary hesitance, hematuria, vaginal or penile discharge, polyuria, sexual difficulty, lumps in testicle or breasts MSK: Negative for muscle cramps or aches, joint pain or swelling Neuro: Negative for headaches, weakness, numbness, dizziness, passing out/fainting Psych: Negative for depression, anxiety, memory problems  Objective  Physical Exam Vitals:   08/04/17 1319  BP: 138/90  Pulse: 84  Temp: 98.1 F (36.7 C)  SpO2: 96%    BP Readings from Last 3 Encounters:  08/04/17 138/90  06/23/17 140/90  12/14/16 130/82   Wt Readings from Last 3 Encounters:  08/04/17 180 lb 12.8 oz (82 kg)  06/23/17 185 lb (83.9 kg)  12/14/16 179 lb (81.2 kg)    Physical Exam  Constitutional: No distress.  HENT:  Head: Normocephalic and atraumatic.  Mouth/Throat: Oropharynx is clear and moist. No oropharyngeal exudate.  Eyes: Pupils are equal, round, and reactive to light. Conjunctivae are normal.  Neck: Neck supple.  Cardiovascular: Normal rate, regular rhythm and normal heart sounds.  Pulmonary/Chest: Effort normal and breath sounds normal.  Abdominal: Soft. Bowel sounds are normal. She exhibits no distension. There is no tenderness.  Genitourinary:  Genitourinary Comments:  Bilateral breast with no skin changes, nipple inversion, masses, or tenderness, no axillary masses bilaterally  Musculoskeletal: She exhibits no edema.  Lymphadenopathy:    She has no cervical adenopathy.  Neurological: She is alert.  Skin: Skin is warm and dry. She is not diaphoretic.  Psychiatric: She has a normal mood and affect.     Assessment/Plan:   Routine general medical examination at a health care facility Physical exam completed.  Encouraged continued dietary changes and exercise.  Mammogram scheduled.  Colonoscopy up-to-date.  Prescription given for Tdap.  Encouraged her to consider Shingrix.  We will send a message to Burgess Estelle to get her set up for lung cancer screening.  Lab work as outlined below.  Atrophic vaginitis Patient notes this is well controlled with topical treatment.  She requests refill of Premarin.  Also she requests a letter sent to her pharmacy outlining her benefit from this.   Orders Placed This Encounter  Procedures  . MM 3D SCREEN BREAST BILATERAL    Standing Status:   Future    Standing Expiration Date:   10/05/2018    Order Specific Question:   Reason for Exam (SYMPTOM  OR DIAGNOSIS REQUIRED)    Answer:   screening    Order Specific Question:   Preferred imaging location?    Answer:   [facility]  . DG Bone Density    Standing Status:   Future    Standing Expiration Date:   10/05/2018    Order Specific Question:   Reason for Exam (SYMPTOM  OR DIAGNOSIS REQUIRED)    Answer:   osteopenia follow-up    Order Specific Question:   Preferred imaging location?    Answer:   [facility]  . Comp Met (CMET)  . Lipid panel  . TSH  . HgB A1c  . CBC    Meds ordered this encounter  Medications  . Tdap (BOOSTRIX) 5-2.5-18.5 LF-MCG/0.5 injection    Sig: Inject 0.5 mLs into the muscle once for 1 dose.    Dispense:  0.5 mL    Refill:  0  . conjugated estrogens (PREMARIN) vaginal cream    Sig: Place 1 Applicatorful vaginally 2 (two) times a  week. Every 4-5 days.    Dispense:  30 g    Refill:  [place], MD [place]

## 2017-08-05 ENCOUNTER — Telehealth: Payer: Self-pay | Admitting: *Deleted

## 2017-08-05 NOTE — Telephone Encounter (Signed)
Received referral for low dose lung cancer screening CT scan. Message left at phone number listed in EMR for patient to call me back to facilitate scheduling scan.  

## 2017-08-09 ENCOUNTER — Other Ambulatory Visit: Payer: Self-pay

## 2017-08-09 MED ORDER — ESTROGENS, CONJUGATED 0.625 MG/GM VA CREA: 1.0000 | TOPICAL_CREAM | VAGINAL | 1 refills | Status: DC

## 2017-08-25 ENCOUNTER — Telehealth: Payer: Self-pay | Admitting: *Deleted

## 2017-08-25 NOTE — Telephone Encounter (Signed)
Received referral for low dose lung cancer screening CT scan. Message left at phone number listed in EMR for patient to call me back to facilitate scheduling scan.  

## 2017-09-01 ENCOUNTER — Telehealth: Payer: Self-pay | Admitting: Family Medicine

## 2017-09-01 NOTE — Telephone Encounter (Signed)
Please contact [place] review to determine if there are any lower cost drugs that [facility] offers for atrophic vaginitis compared to the Premarin vaginal cream that has been prescribed.  Thanks.

## 2017-09-02 ENCOUNTER — Ambulatory Visit
Admission: RE | Admit: 2017-09-02 | Discharge: 2017-09-02 | Disposition: A | Discharge status: Home / Self Care | Payer: Medicare HMO | Source: Ambulatory Visit | Attending: Family Medicine | Admitting: Family Medicine

## 2017-09-02 DIAGNOSIS — Z1231 Encounter for screening mammogram for malignant neoplasm of breast: Secondary | ICD-10-CM | POA: Diagnosis not present

## 2017-09-02 DIAGNOSIS — Z1239 Encounter for other screening for malignant neoplasm of breast: Secondary | ICD-10-CM

## 2017-09-02 NOTE — Telephone Encounter (Signed)
humana states there is no lower cost medication. Tier exceptions are only available for medications that have a lower cost prescription available.

## 2017-09-02 NOTE — Telephone Encounter (Signed)
Noted.  Please contact the patient and let her know this.  Thanks.

## 2017-09-03 NOTE — Telephone Encounter (Signed)
Patient notified and voiced understanding.

## 2017-09-23 ENCOUNTER — Telehealth: Payer: Self-pay | Admitting: *Deleted

## 2017-09-23 NOTE — Telephone Encounter (Signed)
Received referral for low dose lung cancer screening CT scan. Message left at phone number listed in EMR for patient to call me back to facilitate scheduling scan.  

## 2017-09-27 ENCOUNTER — Telehealth: Payer: Self-pay | Admitting: *Deleted

## 2017-09-27 ENCOUNTER — Other Ambulatory Visit: Payer: Self-pay | Admitting: *Deleted

## 2017-09-27 DIAGNOSIS — Z87891 Personal history of nicotine dependence: Secondary | ICD-10-CM

## 2017-09-27 DIAGNOSIS — Z122 Encounter for screening for malignant neoplasm of respiratory organs: Secondary | ICD-10-CM

## 2017-09-27 NOTE — Telephone Encounter (Signed)
Received referral for initial lung cancer screening scan. Contacted patient and obtained smoking history,(former, quit 2007, 74 pack year) as well as answering questions related to screening process. Patient denies signs of lung cancer such as weight loss or hemoptysis. Patient denies comorbidity that would prevent curative treatment if lung cancer were found. Patient is scheduled for shared decision making visit and CT scan on 10/05/17.

## 2017-10-04 ENCOUNTER — Ambulatory Visit
Admission: RE | Admit: 2017-10-04 | Discharge: 2017-10-04 | Disposition: A | Discharge status: Home / Self Care | Payer: Medicare HMO | Source: Ambulatory Visit | Attending: Family Medicine | Admitting: Family Medicine

## 2017-10-04 DIAGNOSIS — M810 Age-related osteoporosis without current pathological fracture: Secondary | ICD-10-CM | POA: Diagnosis not present

## 2017-10-04 DIAGNOSIS — Z79890 Hormone replacement therapy (postmenopausal): Secondary | ICD-10-CM | POA: Diagnosis not present

## 2017-10-04 DIAGNOSIS — Z90722 Acquired absence of ovaries, bilateral: Secondary | ICD-10-CM | POA: Insufficient documentation

## 2017-10-04 DIAGNOSIS — M8580 Other specified disorders of bone density and structure, unspecified site: Secondary | ICD-10-CM | POA: Diagnosis not present

## 2017-10-04 DIAGNOSIS — Z780 Asymptomatic menopausal state: Secondary | ICD-10-CM | POA: Diagnosis not present

## 2017-10-04 DIAGNOSIS — M85859 Other specified disorders of bone density and structure, unspecified thigh: Secondary | ICD-10-CM | POA: Diagnosis not present

## 2017-10-05 ENCOUNTER — Inpatient Hospital Stay: Payer: Medicare HMO | Attending: Oncology | Admitting: Oncology

## 2017-10-05 ENCOUNTER — Ambulatory Visit
Admission: RE | Admit: 2017-10-05 | Discharge: 2017-10-05 | Disposition: A | Discharge status: Home / Self Care | Payer: Medicare HMO | Source: Ambulatory Visit | Attending: Oncology | Admitting: Oncology

## 2017-10-05 ENCOUNTER — Encounter: Payer: Self-pay | Admitting: Oncology

## 2017-10-05 DIAGNOSIS — Z87891 Personal history of nicotine dependence: Secondary | ICD-10-CM | POA: Diagnosis not present

## 2017-10-05 DIAGNOSIS — Z122 Encounter for screening for malignant neoplasm of respiratory organs: Secondary | ICD-10-CM | POA: Insufficient documentation

## 2017-10-05 DIAGNOSIS — I700 Atherosclerosis of aorta: Secondary | ICD-10-CM | POA: Insufficient documentation

## 2017-10-05 DIAGNOSIS — J438 Other emphysema: Secondary | ICD-10-CM | POA: Insufficient documentation

## 2017-10-05 DIAGNOSIS — J432 Centrilobular emphysema: Secondary | ICD-10-CM | POA: Diagnosis not present

## 2017-10-05 DIAGNOSIS — R918 Other nonspecific abnormal finding of lung field: Secondary | ICD-10-CM | POA: Diagnosis not present

## 2017-10-05 NOTE — Progress Notes (Signed)
In accordance with CMS guidelines, patient has met eligibility criteria including age, absence of signs or symptoms of lung cancer.  Social History   Tobacco Use  . Smoking status: Former Smoker    Packs/day: 2.00    Years: 18.50    Pack years: 37.00    Types: Cigarettes    Last attempt to quit: 03/18/2006    Years since quitting: 11.5  . Smokeless tobacco: Never Used  Substance Use Topics  . Alcohol use: Yes    Alcohol/week: 0.0 - 1.2 oz    Comment: Occasional glass of wine, sometimes 2 in a week, sometimes 1 every couple of months.  . Drug use: No     A shared decision-making session was conducted prior to the performance of CT scan. This includes one or more decision aids, includes benefits and harms of screening, follow-up diagnostic testing, over-diagnosis, false positive rate, and total radiation exposure.  Counseling on the importance of adherence to annual lung cancer LDCT screening, impact of co-morbidities, and ability or willingness to undergo diagnosis and treatment is imperative for compliance of the program.  Counseling on the importance of continued smoking cessation for former smokers; the importance of smoking cessation for current smokers, and information about tobacco cessation interventions have been given to patient including [place] and 1800 quit [place] programs.  Written order for lung cancer screening with LDCT has been given to the patient and any and all questions have been answered to the best of my abilities.   Yearly follow up will be coordinated by Burgess Estelle, Thoracic Navigator.  Faythe Casa, NP 10/05/2017 2:07 PM

## 2017-10-08 ENCOUNTER — Encounter: Payer: Self-pay | Admitting: *Deleted

## 2017-10-20 DIAGNOSIS — H26493 Other secondary cataract, bilateral: Secondary | ICD-10-CM | POA: Diagnosis not present

## 2017-10-20 DIAGNOSIS — Z961 Presence of intraocular lens: Secondary | ICD-10-CM | POA: Diagnosis not present

## 2017-10-25 ENCOUNTER — Telehealth: Payer: Self-pay | Admitting: Family Medicine

## 2017-10-25 NOTE — Progress Notes (Signed)
Subjective:    Patient ID: Amber Perez, female    DOB: 1945-07-06, 72 y.o.   MRN: [id number]  HPI  Amber Perez is a 72 year old female who presents today with a sore fingernail on her 3rd finger of her left hand.  She denies any triggers and states that she is not sure if she noticed this after trimming her nails. She reports that her daughter is a NP and has attempted drainage 3 times but she states that nothing has drained.  Onset: One week ago Erythema: Yes Pain: discomfort with palpation of area Warmth: No Edema: Yes Fever: No Chills: No Malaise: No  Headache: No Treatment: She reports soaking her finger in epsom salts which has provided limited benefit. Aggravating factor: pressure on finger Alleviating factor: nothing  History of HTN and GERD. No history of DM and she is not currently smoking. She is a former smoker stating she quit in 2007.   Review of Systems  Constitutional: Negative for chills, fatigue and fever.  Respiratory: Negative for cough, shortness of breath and wheezing.   Cardiovascular: Negative for chest pain and palpitations.  Gastrointestinal: Negative for abdominal pain.  Musculoskeletal: Negative for myalgias.       Sore fingernail  Skin: Negative for rash.  Neurological: Negative for dizziness and headaches.   Past Medical History:  Diagnosis Date  . Closed left arm fracture    [facility]   . COPD (chronic obstructive pulmonary disease) ([place])   . Dyslipidemia   . GERD (gastroesophageal reflux disease)   . Hypertension 2008  . Osteoarthritis   . Osteoporosis   . Pneumonia 2008  . Pre-diabetes   . Thyroid nodule   . Vitamin D deficiency      Social History   Socioeconomic History  . Marital status: Divorced    Spouse name: Not on file  . Number of children: 2  . Years of education: Not on file  . Highest education level: Not on file  Occupational History  . Occupation: Retired - Designer, jewellery  Social Needs   . Financial resource strain: Not on file  . Food insecurity:    Worry: Not on file    Inability: Not on file  . Transportation needs:    Medical: Not on file    Non-medical: Not on file  Tobacco Use  . Smoking status: Former Smoker    Packs/day: 2.00    Years: 18.50    Pack years: 37.00    Types: Cigarettes    Last attempt to quit: 03/18/2006    Years since quitting: 11.6  . Smokeless tobacco: Never Used  Substance and Sexual Activity  . Alcohol use: Yes    Alcohol/week: 0.0 - 1.2 oz    Comment: Occasional glass of wine, sometimes 2 in a week, sometimes 1 every couple of months.  . Drug use: No  . Sexual activity: Never  Lifestyle  . Physical activity:    Days per week: Not on file    Minutes per session: Not on file  . Stress: Not on file  Relationships  . Social connections:    Talks on phone: Not on file    Gets together: Not on file    Attends religious service: Not on file    Active member of club or organization: Not on file    Attends meetings of clubs or organizations: Not on file    Relationship status: Not on file  . Intimate partner violence:  Fear of current or ex partner: Not on file    Emotionally abused: Not on file    Physically abused: Not on file    Forced sexual activity: Not on file  Other Topics Concern  . Not on file  Social History Narrative   Regular Exercise -  Occasional   Daily Caffeine Use:  2 cups coffee & Diet Mt Dew    Past Surgical History:  Procedure Laterality Date  . ABDOMINAL HYSTERECTOMY  1995  . BILATERAL SALPINGOOPHORECTOMY  1995  . BREAST BIOPSY Left    neg  . BREAST SURGERY     Benign per pt's report  . CATARACT EXTRACTION  2009  . CESAREAN SECTION  [id number]  . COLONOSCOPY  2007  . GANGLION CYST EXCISION    . KNEE ARTHROPLASTY Right 06/29/2016   Procedure: COMPUTER ASSISTED TOTAL KNEE ARTHROPLASTY;  Surgeon: [name], MD;  Location: [facility] ORS;  Service: Orthopedics;  Laterality: Right;  . KNEE ARTHROSCOPY  W/ MENISCAL REPAIR Right 04/2012   [name]  . UTERINE FIBROID SURGERY     x 5    Family History  Problem Relation Age of Onset  . Anemia Mother        aplastic  . Aplastic anemia Mother   . Throat cancer Father   . Hypertension Father   . Alcohol abuse Father   . COPD Father   . Diabetes Sister   . Esophageal cancer Brother   . Colon polyps Sister   . Diabetes Brother   . Multiple myeloma Brother   . Diabetes Brother   . Colon cancer Maternal Aunt   . Breast cancer Neg Hx     Allergies  Allergen Reactions  . Penicillins Hives and Rash    Also drop in BP Has patient had a PCN reaction causing immediate rash, facial/tongue/throat swelling, SOB or lightheadedness with hypotension:Yes Has patient had a PCN reaction causing severe rash involving mucus membranes or skin necrosis:No Has patient had a PCN reaction that required hospitalization:No Has patient had a PCN reaction occurring within the last 10 years:No If all of the above answers are "NO", then may proceed with Cephalosporin use.   [name] Bisphosphonates     Severe GI upset, damaged esophagus.    Current Outpatient Medications on File Prior to Visit  Medication Sig Dispense Refill  . amLODipine (NORVASC) 5 MG tablet TAKE 1 TABLET (5 MG TOTAL) BY MOUTH DAILY. 90 tablet 3  . Biotin w/ Vitamins C & E (HAIR/SKIN/NAILS PO) Take 2 capsules by mouth at bedtime.    . Calcium-Magnesium (CAL-MAG PO) Take 2 tablets by mouth at bedtime.    . cetirizine (ZYRTEC) 10 MG tablet Take 1 tablet (10 mg total) by mouth daily. (Patient taking differently: Take 10 mg by mouth daily as needed (for allergies.). ) 90 tablet 4  . Cholecalciferol (VITAMIN D3 PO) Take 2,500 Units by mouth at bedtime.    . COLLAGEN PO Take 1 Dose by mouth daily. Marine Collagen Peptide with MSM 1 scoop a day in hot coffee.    . conjugated estrogens (PREMARIN) vaginal cream Place 1 Applicatorful vaginally 2 (two) times a week. 30 g 1  . hydrochlorothiazide  (MICROZIDE) 12.5 MG capsule Take 1 capsule (12.5 mg total) by mouth daily as needed (for swelling/fluid retention.). 90 capsule 0  . Omega-3 Fatty Acids (FISH OIL ULTRA) 1400 MG CAPS Take 1,400 mg by mouth at bedtime.    . pantoprazole (PROTONIX) 40 MG tablet Take 1 tablet (  40 mg total) by mouth daily. 90 tablet 3  . traMADol (ULTRAM) 50 MG tablet Take 1 tablet (50 mg total) by mouth every 8 (eight) hours as needed. 90 tablet 0  . triamcinolone (NASACORT AQ) 55 MCG/ACT AERO nasal inhaler Place 2 sprays into the nose as needed.    . TURMERIC PO Take 200 mg by mouth at bedtime.     No current facility-administered medications on file prior to visit.     BP 140/82 (BP Location: Right Arm, Patient Position: Sitting, Cuff Size: Large)   Pulse 90   Temp 97.7 F (36.5 C) (Oral)   Wt 186 lb 8 oz (84.6 kg)   SpO2 98%   BMI 37.35 kg/m       Objective:   Physical Exam  Constitutional: She is oriented to person, place, and time. She appears well-developed and well-nourished.  Eyes: Pupils are equal, round, and reactive to light. No scleral icterus.  Neck: Neck supple.  Cardiovascular: Normal rate and regular rhythm.  Pulmonary/Chest: Effort normal and breath sounds normal. She has no wheezes. She has no rales.  Abdominal: Soft. Normal appearance and bowel sounds are normal. There is no tenderness.  Lymphadenopathy:    She has no cervical adenopathy.  Neurological: She is alert and oriented to person, place, and time.  Skin: Skin is warm and dry.  Third digit of left hand exhibits erythema,  edema, and pain with palpation of lateral nail fold. Prior attempts to drain area by daughter who is a NP did not result in drainage. No drainage or fluctuance present today.   Psychiatric: She has a normal mood and affect. Her behavior is normal. Judgment and thought content normal.       Assessment & Plan:  1. Paronychia of left middle finger Exam and history are most consistent with paronychia that  may have been triggered by trimming of fingernails. Prior attempts to I & D by daughter who is a NP did not result in drainage. With erythema, edema, and pain will treat with doxycycline today. Advised use of warm compresses and keep area clean. Discussed close return precautions and advised follow up if symptoms do not improve with treatment, worsen, or new symptoms develop. Further advised her to seek care immediately if she notices any red streaks from the area.   - doxycycline (VIBRA-TABS) 100 MG tablet; Take 1 tablet (100 mg total) by mouth 2 (two) times daily.  Dispense: 20 tablet; Refill: 0  Delano Metz, FNP-C

## 2017-10-25 NOTE — Telephone Encounter (Signed)
Patient notifid that we are not able to send in a prescription. Patient is scheduled with Delano Metz NP

## 2017-10-25 NOTE — Telephone Encounter (Signed)
Copied from [place] [id number]. Topic: Quick Communication - See Telephone Encounter >> Oct 25, 2017 11:10 AM Ahmed Prima L wrote: CRM for notification. See Telephone encounter for: 10/25/17.  Patient states she has an infected hang nail. States her daughter is a Engineer, mining and has punctured it and it will not drain. She would like to know if Dr Caryl Bis would call her in something as in an antibotiic. Patient said if she needs an appointment to please call her back. [phone]

## 2017-10-26 ENCOUNTER — Ambulatory Visit (INDEPENDENT_AMBULATORY_CARE_PROVIDER_SITE_OTHER): Payer: Medicare HMO | Admitting: Family Medicine

## 2017-10-26 ENCOUNTER — Other Ambulatory Visit: Payer: Self-pay

## 2017-10-26 ENCOUNTER — Encounter: Payer: Self-pay | Admitting: Family Medicine

## 2017-10-26 VITALS — BP 140/82 | HR 90 | Temp 97.7°F | Wt 186.5 lb

## 2017-10-26 DIAGNOSIS — L03012 Cellulitis of left finger: Secondary | ICD-10-CM

## 2017-10-26 MED ORDER — DOXYCYCLINE HYCLATE 100 MG PO TABS: 100.0000 mg | ORAL_TABLET | Freq: Two times a day (BID) | ORAL | 0 refills | Status: DC

## 2017-10-26 MED ORDER — ESTROGENS, CONJUGATED 0.625 MG/GM VA CREA: 1.0000 | TOPICAL_CREAM | VAGINAL | 1 refills | Status: DC

## 2017-10-26 NOTE — Patient Instructions (Signed)
Please take medication with food as directed.  Use warm compresses for 15 minutes and then wait an hour before using warm compress again.  If symptoms do not improve with treatment, worsen, or new symptoms develop, seek medical attention. It is very important to seek immediate medical treatment for any red streaks or worsening symptoms such as fever and increasing redness or swelling.    Paronychia Paronychia is an infection of the skin. It happens near a fingernail or toenail. It may cause pain and swelling around the nail. Usually, it is not serious and it clears up with treatment. Follow these instructions at home:  Soak the fingers or toes in warm water as told by your doctor. You may be told to do this for 20 minutes, 2-3 times a day.  Keep the area dry when you are not soaking it.  Take medicines only as told by your doctor.  If you were given an antibiotic medicine, finish all of it even if you start to feel better.  Keep the affected area clean.  Do not try to drain a fluid-filled bump yourself.  Wear rubber gloves when putting your hands in water.  Wear gloves if your hands might touch cleaners or chemicals.  Follow your doctor's instructions about: ? Wound care. ? Bandage (dressing) changes and removal. Contact a doctor if:  Your symptoms get worse or do not improve.  You have a fever or chills.  You have redness spreading from the affected area.  You have more fluid, blood, or pus coming from the affected area.  Your finger or knuckle is swollen or is hard to move. This information is not intended to replace advice given to you by your health care provider. Make sure you discuss any questions you have with your health care provider. Document Released: 03/11/2009 Document Revised: 08/29/2015 Document Reviewed: 02/28/2014 Elsevier Interactive Patient Education  [employer].

## 2017-10-26 NOTE — Telephone Encounter (Signed)
Patient request written script so she can take to pharmacy so she can see where she can get best price on medication

## 2017-11-05 ENCOUNTER — Ambulatory Visit: Payer: Medicare HMO

## 2017-11-09 ENCOUNTER — Ambulatory Visit: Payer: Medicare HMO

## 2018-01-11 DIAGNOSIS — M25561 Pain in right knee: Secondary | ICD-10-CM | POA: Diagnosis not present

## 2018-01-11 DIAGNOSIS — Z96651 Presence of right artificial knee joint: Secondary | ICD-10-CM | POA: Diagnosis not present

## 2018-02-09 ENCOUNTER — Ambulatory Visit: Payer: Medicare HMO | Admitting: Family Medicine

## 2018-02-15 ENCOUNTER — Ambulatory Visit: Payer: Medicare HMO | Admitting: Family Medicine

## 2018-02-15 DIAGNOSIS — H02831 Dermatochalasis of right upper eyelid: Secondary | ICD-10-CM | POA: Diagnosis not present

## 2018-02-15 DIAGNOSIS — H18413 Arcus senilis, bilateral: Secondary | ICD-10-CM | POA: Diagnosis not present

## 2018-02-15 DIAGNOSIS — Z961 Presence of intraocular lens: Secondary | ICD-10-CM | POA: Diagnosis not present

## 2018-02-15 DIAGNOSIS — H26492 Other secondary cataract, left eye: Secondary | ICD-10-CM | POA: Diagnosis not present

## 2018-02-23 DIAGNOSIS — H26492 Other secondary cataract, left eye: Secondary | ICD-10-CM | POA: Diagnosis not present

## 2018-03-01 ENCOUNTER — Ambulatory Visit: Payer: Medicare HMO | Admitting: Family Medicine

## 2018-04-07 ENCOUNTER — Other Ambulatory Visit: Payer: Self-pay | Admitting: Family Medicine

## 2018-04-07 DIAGNOSIS — K219 Gastro-esophageal reflux disease without esophagitis: Secondary | ICD-10-CM

## 2018-07-12 ENCOUNTER — Encounter: Payer: Self-pay | Admitting: Family Medicine

## 2018-07-13 ENCOUNTER — Ambulatory Visit (INDEPENDENT_AMBULATORY_CARE_PROVIDER_SITE_OTHER): Payer: Medicare HMO | Admitting: Family Medicine

## 2018-07-13 ENCOUNTER — Other Ambulatory Visit: Payer: Self-pay

## 2018-07-13 ENCOUNTER — Telehealth: Payer: Self-pay

## 2018-07-13 ENCOUNTER — Encounter: Payer: Self-pay | Admitting: Family Medicine

## 2018-07-13 DIAGNOSIS — J449 Chronic obstructive pulmonary disease, unspecified: Secondary | ICD-10-CM | POA: Diagnosis not present

## 2018-07-13 DIAGNOSIS — R0781 Pleurodynia: Secondary | ICD-10-CM

## 2018-07-13 DIAGNOSIS — R0609 Other forms of dyspnea: Secondary | ICD-10-CM | POA: Diagnosis not present

## 2018-07-13 MED ORDER — TRAMADOL HCL 50 MG PO TABS: 50.0000 mg | ORAL_TABLET | Freq: Three times a day (TID) | ORAL | 0 refills | Status: DC | PRN

## 2018-07-13 NOTE — Telephone Encounter (Signed)
Please put pt on for 2 PM today and needs to be set up for Doxy me please place pt on Epic schedule   doxyme --  [phone] pt does have a smart phone appt is at 2 PM

## 2018-07-13 NOTE — Telephone Encounter (Signed)
Would you like for me to get her set up for a virtual visit?

## 2018-07-13 NOTE — Telephone Encounter (Signed)
Called and spoke with pt. Pt is willing to do a virtual visit OK to fit her on your schedule today since your 10 AM canceled or schedule pt with another provider pt was willing to see someone else as well.   Sent to PCP to advise.

## 2018-07-13 NOTE — Progress Notes (Signed)
Virtual Visit via video note  This visit type was conducted due to national recommendations for restrictions regarding the COVID-19 pandemic (e.g. social distancing).  This format is felt to be most appropriate for this patient at this time.  All issues noted in this document were discussed and addressed.  No physical exam was performed (except for noted visual exam findings with Video Visits).   I connected with Reggie Pile on 07/14/18 at  2:00 PM EDT by a video enabled telemedicine application or telephone and verified that I am speaking with the correct person using two identifiers. Location patient: home Location provider: work Persons participating in the virtual visit: patient, provider  I discussed the limitations, risks, security and privacy concerns of performing an evaluation and management service by telephone and the availability of in person appointments. I also discussed with the patient that there may be a patient responsible charge related to this service. The patient expressed understanding and agreed to proceed.  Reason for visit: rib pain  HPI: Rib pain: Patient notes 8 days ago she was out for a walk at night and tripped and fell onto the grass on her chest.  She notes no head injury or loss of consciousness.  She notes since then she has had bilateral lower rib pain and some discomfort near her breast.  She does note bruising came up on her breast yesterday.  She notes her left ribs hurt greater than her right ribs.  She notes her daughter is a Designer, jewellery and reported that the patient's breathing was "weird" right after this occurred though returned to normal the next day.  She has been taking ibuprofen and Tylenol for discomfort though she has more discomfort at night that makes it hard to sleep as it hurts when she rolls over in bed.  She notes no dyspnea related to this.  No cough.  No fevers.  She notes her pain is 7/10 during the night and 4/10 during the  day.  Dyspnea on exertion: Patient notes this has been going on for months.  Got worse through the winter when she put weight on.  She notes no chest pain.  She notes mild dyspnea with walking.  She feels this is related to weight gain.  She has been trying to exercise to help with this.   ROS: See pertinent positives and negatives per HPI.  Past Medical History:  Diagnosis Date  . Closed left arm fracture    [facility]   . COPD (chronic obstructive pulmonary disease) ([place])   . Dyslipidemia   . GERD (gastroesophageal reflux disease)   . Hypertension 2008  . Osteoarthritis   . Osteoporosis   . Pneumonia 2008  . Pre-diabetes   . Thyroid nodule   . Vitamin D deficiency     Past Surgical History:  Procedure Laterality Date  . ABDOMINAL HYSTERECTOMY  1995  . BILATERAL SALPINGOOPHORECTOMY  1995  . BREAST BIOPSY Left    neg  . BREAST SURGERY     Benign per pt's report  . CATARACT EXTRACTION  2009  . CESAREAN SECTION  [id number]  . COLONOSCOPY  2007  . GANGLION CYST EXCISION    . KNEE ARTHROPLASTY Right 06/29/2016   Procedure: COMPUTER ASSISTED TOTAL KNEE ARTHROPLASTY;  Surgeon: [name], MD;  Location: [facility] ORS;  Service: Orthopedics;  Laterality: Right;  . KNEE ARTHROSCOPY W/ MENISCAL REPAIR Right 04/2012   [name]  . UTERINE FIBROID SURGERY     x 5  Family History  Problem Relation Age of Onset  . Anemia Mother        aplastic  . Aplastic anemia Mother   . Throat cancer Father   . Hypertension Father   . Alcohol abuse Father   . COPD Father   . Diabetes Sister   . Esophageal cancer Brother   . Colon polyps Sister   . Diabetes Brother   . Multiple myeloma Brother   . Diabetes Brother   . Colon cancer Maternal Aunt   . Breast cancer Neg Hx     SOCIAL HX: Former smoker   Current Outpatient Medications:  .  amLODipine (NORVASC) 5 MG tablet, TAKE 1 TABLET EVERY DAY, Disp: 90 tablet, Rfl: 3 .  Biotin w/ Vitamins C & E (HAIR/SKIN/NAILS PO), Take 2  capsules by mouth at bedtime., Disp: , Rfl:  .  Calcium-Magnesium (CAL-MAG PO), Take 2 tablets by mouth at bedtime., Disp: , Rfl:  .  cetirizine (ZYRTEC) 10 MG tablet, Take 1 tablet (10 mg total) by mouth daily. (Patient taking differently: Take 10 mg by mouth daily as needed (for allergies.). ), Disp: 90 tablet, Rfl: 4 .  Cholecalciferol (VITAMIN D3 PO), Take 2,500 Units by mouth at bedtime., Disp: , Rfl:  .  COLLAGEN PO, Take 1 Dose by mouth daily. Marine Collagen Peptide with MSM 1 scoop a day in hot coffee., Disp: , Rfl:  .  conjugated estrogens (PREMARIN) vaginal cream, Place 1 Applicatorful vaginally 2 (two) times a week., Disp: 30 g, Rfl: 1 .  hydrochlorothiazide (MICROZIDE) 12.5 MG capsule, Take 1 capsule (12.5 mg total) by mouth daily as needed (for swelling/fluid retention.)., Disp: 90 capsule, Rfl: 0 .  Omega-3 Fatty Acids (FISH OIL ULTRA) 1400 MG CAPS, Take 1,400 mg by mouth at bedtime., Disp: , Rfl:  .  pantoprazole (PROTONIX) 40 MG tablet, TAKE 1 TABLET EVERY DAY, Disp: 90 tablet, Rfl: 3 .  traMADol (ULTRAM) 50 MG tablet, Take 1 tablet (50 mg total) by mouth every 8 (eight) hours as needed., Disp: 10 tablet, Rfl: 0 .  triamcinolone (NASACORT AQ) 55 MCG/ACT AERO nasal inhaler, Place 2 sprays into the nose as needed., Disp: , Rfl:  .  TURMERIC PO, Take 200 mg by mouth at bedtime., Disp: , Rfl:   EXAM:  VITALS per patient if applicable: None.  GENERAL: alert, oriented, appears well and in no acute distress  HEENT: atraumatic, conjunttiva clear, no obvious abnormalities on inspection of external nose and ears  NECK: normal movements of the head and neck  LUNGS: on inspection no signs of respiratory distress, breathing rate appears normal, no obvious gross SOB, gasping or wheezing, patient is able to lift her shirt up revealing her lower ribs with no obvious signs of bruising  CV: no obvious cyanosis  MS: moves all visible extremities without noticeable abnormality  PSYCH/NEURO:  pleasant and cooperative, no obvious depression or anxiety, speech and thought processing grossly intact  ASSESSMENT AND PLAN:  Discussed the following assessment and plan:  Rib pain  Dyspnea on exertion  Rib pain Patient with rib pain status post mechanical fall.  She has continued to have rib pain.  I discussed that this could represent a muscular strain versus soft tissue injury versus rib fracture.  I advised that it is difficult to determine if there is any underlying lung injury given that I cannot listen to her lungs through a video visit.  I advised that the optimal thing to do would be obtain an x-ray to  rule out fracture and underlying other issues.  She declined this stating that she would prefer to stay out of the office at this time given coronavirus concerns and monitor her symptoms with better pain management.  We will treat her discomfort with tramadol.  She denies history of seizures.  I did discuss that if she does not start to improve over the next several days to 2 weeks she needs to let us know so we can have her come into the office for an x-ray.  If she worsens she will seek medical attention.  I discussed return precautions.  Dyspnea on exertion This is been an ongoing issue for a number of months.  This could be deconditioning and weight related though I did discuss possible other causes including cardiac and pulmonary causes.  Discussed evaluation for cardiac cause though she deferred this at this time.  Advised her to monitor her symptoms and if they are not improving or if they are worsening to let us know.  We will plan to follow-up in 2 to 3 months in the office for this issue.    I discussed the assessment and treatment plan with the patient. The patient was provided an opportunity to ask questions and all were answered. The patient agreed with the plan and demonstrated an understanding of the instructions.   The patient was advised to call back or seek an in-person  evaluation if the symptoms worsen or if the condition fails to improve as anticipated.   [name], MD

## 2018-07-13 NOTE — Telephone Encounter (Signed)
I would suggest a virtual visit if she is able to do that.

## 2018-07-13 NOTE — Telephone Encounter (Signed)
You could schedule her at 2 pm for the virtual visit.

## 2018-07-14 ENCOUNTER — Encounter: Payer: Self-pay | Admitting: Family Medicine

## 2018-07-14 ENCOUNTER — Telehealth: Payer: Self-pay | Admitting: Family Medicine

## 2018-07-14 DIAGNOSIS — R0609 Other forms of dyspnea: Secondary | ICD-10-CM | POA: Insufficient documentation

## 2018-07-14 NOTE — Telephone Encounter (Signed)
Pt called this afternoon about this note. She is concerned no body will answer her before weekend. Pt was told that Dr. Caryl Bis is not in office today and office is closed tomorrow.

## 2018-07-14 NOTE — Telephone Encounter (Signed)
Please contact the patient and get her set up for 2 to 51-month follow-up.  Thanks.

## 2018-07-14 NOTE — Telephone Encounter (Signed)
Pt has been scheduled.  °

## 2018-07-14 NOTE — Assessment & Plan Note (Signed)
Patient with rib pain status post mechanical fall.  She has continued to have rib pain.  I discussed that this could represent a muscular strain versus soft tissue injury versus rib fracture.  I advised that it is difficult to determine if there is any underlying lung injury given that I cannot listen to her lungs through a video visit.  I advised that the optimal thing to do would be obtain an x-ray to rule out fracture and underlying other issues.  She declined this stating that she would prefer to stay out of the office at this time given coronavirus concerns and monitor her symptoms with better pain management.  We will treat her discomfort with tramadol.  She denies history of seizures.  I did discuss that if she does not start to improve over the next several days to 2 weeks she needs to let us know so we can have her come into the office for an x-ray.  If she worsens she will seek medical attention.  I discussed return precautions.

## 2018-07-14 NOTE — Assessment & Plan Note (Signed)
This is been an ongoing issue for a number of months.  This could be deconditioning and weight related though I did discuss possible other causes including cardiac and pulmonary causes.  Discussed evaluation for cardiac cause though she deferred this at this time.  Advised her to monitor her symptoms and if they are not improving or if they are worsening to let us know.  We will plan to follow-up in 2 to 3 months in the office for this issue.

## 2018-07-18 MED ORDER — TRAMADOL HCL 50 MG PO TABS: 50.0000 mg | ORAL_TABLET | Freq: Three times a day (TID) | ORAL | 0 refills | Status: DC | PRN

## 2018-07-18 NOTE — Telephone Encounter (Signed)
Sent to PCP to advise 

## 2018-07-18 NOTE — Telephone Encounter (Signed)
Sent to PCP ?

## 2018-07-18 NOTE — Addendum Note (Signed)
Addended by: Caryl Bis Jaelen Soth G on: 07/18/2018 04:10 PM   Modules accepted: Orders

## 2018-07-19 ENCOUNTER — Ambulatory Visit (INDEPENDENT_AMBULATORY_CARE_PROVIDER_SITE_OTHER): Payer: Medicare HMO | Admitting: Family Medicine

## 2018-07-19 ENCOUNTER — Other Ambulatory Visit: Payer: Self-pay

## 2018-07-19 ENCOUNTER — Encounter: Payer: Self-pay | Admitting: Family Medicine

## 2018-07-19 VITALS — BP 140/82 | HR 77 | Temp 97.7°F | Ht 59.25 in | Wt 189.2 lb

## 2018-07-19 DIAGNOSIS — R7303 Prediabetes: Secondary | ICD-10-CM

## 2018-07-19 DIAGNOSIS — I10 Essential (primary) hypertension: Secondary | ICD-10-CM | POA: Diagnosis not present

## 2018-07-19 DIAGNOSIS — N644 Mastodynia: Secondary | ICD-10-CM | POA: Diagnosis not present

## 2018-07-19 LAB — BASIC METABOLIC PANEL
BUN: 19 mg/dL (ref 6–23)
CO2: 27 mEq/L (ref 19–32)
Calcium: 9.6 mg/dL (ref 8.4–10.5)
Chloride: 101 mEq/L (ref 96–112)
Creatinine, Ser: 0.62 mg/dL (ref 0.40–1.20)
GFR: 94.79 mL/min (ref >60.00)
Glucose, Bld: 96 mg/dL (ref 70–99)
Potassium: 4 mEq/L (ref 3.5–5.1)
Sodium: 137 mEq/L (ref 135–145)

## 2018-07-19 LAB — HEMOGLOBIN A1C: Hgb A1c MFr Bld: 6.3 % (ref 4.6–6.5)

## 2018-07-19 NOTE — Patient Instructions (Signed)
Nice to see you. Please monitor your discomfort and if it does not continue to improve over the next 2 weeks we will consider the next step. We will get you set up for a phone follow-up in 2 weeks to determine if you are improving.

## 2018-07-19 NOTE — Progress Notes (Signed)
  [name], MD Phone: [phone]  Amber Perez is a 73 y.o. female who presents today for same day visit.   Breast pain: Patient previously had a fall where she tripped and fell onto the grass on her chest.  She noted lower rib pain and some discomfort near her breasts.  She notes the rib pain has progressively been improving though she has developed fairly significant breast discomfort.  She notes the bruising has been fading in her breast.  She notes it is mostly her right lateral breast that is tender to palpation and hurts mostly when she lays on it.  She has noted no lumps.  She notes no fevers or cough.  She has been taking Celebrex now instead of ibuprofen.  Taking Tylenol.  She also takes tramadol.  She does note these do a decent job controlling her symptoms.  Social History   Tobacco Use  Smoking Status Former Smoker  . Packs/day: 2.00  . Years: 18.50  . Pack years: 37.00  . Types: Cigarettes  . Last attempt to quit: 03/18/2006  . Years since quitting: 12.3  Smokeless Tobacco Never Used     ROS see history of present illness  Objective  Physical Exam Vitals:   07/19/18 1110  BP: 140/82  Pulse: 77  Temp: 97.7 F (36.5 C)  SpO2: 96%    BP Readings from Last 3 Encounters:  07/19/18 140/82  10/26/17 140/82  08/04/17 138/90   Wt Readings from Last 3 Encounters:  07/19/18 189 lb 3.2 oz (85.8 kg)  10/26/17 186 lb 8 oz (84.6 kg)  10/05/17 180 lb (81.6 kg)    Physical Exam Constitutional:      General: She is not in acute distress.    Appearance: She is not diaphoretic.  Cardiovascular:     Rate and Rhythm: Normal rate and regular rhythm.     Heart sounds: Normal heart sounds.  Pulmonary:     Effort: Pulmonary effort is normal.     Breath sounds: Normal breath sounds.  Chest:       Comments: Chaperone used, patient with tenderness inferior to the breast in her ribs on the right, also has some tenderness on deep palpation of her left medial  breast when contacting the ribs, no palpable masses or nodules, no nipple inversion, no other tenderness, no axillary masses, there are no bony defects palpated, no flail chest Skin:    General: Skin is warm and dry.  Neurological:     Mental Status: She is alert.      Assessment/Plan: Please see individual problem list.  Breast pain Patient with tenderness and some discomfort noted in bilateral breast after a fall.  The tenderness appears to be musculoskeletal with the discomfort occurring on palpation of her ribs through her breast tissue.  She has no underlying palpable lesions.  Discussed that this is likely a musculoskeletal issue related to her fall.  There does not appear to be any evidence of rib fracture.  Discussed monitoring her symptoms and continuing with tramadol sparingly for pain.  Discussed that if her pain does not start to improve over the next 1 to 2 weeks she needs to let us know so we can consider further evaluation.   Orders Placed This Encounter  Procedures  . Basic Metabolic Panel (BMET)  . HgB A1c    No orders of the defined types were placed in this encounter.    [name], MD [facility]

## 2018-07-20 DIAGNOSIS — N644 Mastodynia: Secondary | ICD-10-CM | POA: Insufficient documentation

## 2018-07-20 NOTE — Assessment & Plan Note (Signed)
Patient with tenderness and some discomfort noted in bilateral breast after a fall.  The tenderness appears to be musculoskeletal with the discomfort occurring on palpation of her ribs through her breast tissue.  She has no underlying palpable lesions.  Discussed that this is likely a musculoskeletal issue related to her fall.  There does not appear to be any evidence of rib fracture.  Discussed monitoring her symptoms and continuing with tramadol sparingly for pain.  Discussed that if her pain does not start to improve over the next 1 to 2 weeks she needs to let us know so we can consider further evaluation.

## 2018-10-14 ENCOUNTER — Telehealth: Payer: Self-pay | Admitting: *Deleted

## 2018-10-14 NOTE — Telephone Encounter (Signed)
Disconnected x2 during call

## 2018-10-17 NOTE — Telephone Encounter (Signed)
ERROR

## 2018-10-26 DIAGNOSIS — Z961 Presence of intraocular lens: Secondary | ICD-10-CM | POA: Diagnosis not present

## 2018-10-26 DIAGNOSIS — H1045 Other chronic allergic conjunctivitis: Secondary | ICD-10-CM | POA: Diagnosis not present

## 2018-10-26 DIAGNOSIS — H26499 Other secondary cataract, unspecified eye: Secondary | ICD-10-CM | POA: Diagnosis not present

## 2018-10-31 ENCOUNTER — Encounter: Payer: Self-pay | Admitting: General Surgery

## 2018-11-02 ENCOUNTER — Ambulatory Visit (INDEPENDENT_AMBULATORY_CARE_PROVIDER_SITE_OTHER): Payer: Medicare HMO

## 2018-11-02 DIAGNOSIS — Z0000 Encounter for general adult medical examination without abnormal findings: Secondary | ICD-10-CM

## 2018-11-02 NOTE — Progress Notes (Signed)
Subjective:   Amber Perez is a 73 y.o. female who presents for Medicare Annual (Subsequent) preventive examination.  Review of Systems:  No ROS.  Medicare Wellness Virtual Visit.  Visual/audio telehealth visit, UTA vital signs.   See social history for additional risk factors.   Cardiac Risk Factors include: advanced age (>93mn, >>22women);hypertension     Objective:     Vitals: There were no vitals taken for this visit.  There is no height or weight on file to calculate BMI.  Advanced Directives 11/02/2018 11/04/2016 06/29/2016 06/17/2016  Does Patient Have a Medical Advance Directive? No No No No  Would patient like information on creating a medical advance directive? Yes (MAU/Ambulatory/Procedural Areas - Information given) No - Patient declined No - Patient declined Yes (MAU/Ambulatory/Procedural Areas - Information given)    Tobacco Social History   Tobacco Use  Smoking Status Former Smoker  . Packs/day: 2.00  . Years: 18.50  . Pack years: 37.00  . Types: Cigarettes  . Quit date: 03/18/2006  . Years since quitting: 12.6  Smokeless Tobacco Never Used     Counseling given: Not Answered   Clinical Intake:  Pre-visit preparation completed: Yes        Diabetes: No  How often do you need to have someone help you when you read instructions, pamphlets, or other written materials from your doctor or pharmacy?: 1 - Never  Interpreter Needed?: No     Past Medical History:  Diagnosis Date  . Closed left arm fracture    [facility]   . COPD (chronic obstructive pulmonary disease) ([place]   . Dyslipidemia   . GERD (gastroesophageal reflux disease)   . Hypertension 2008  . Osteoarthritis   . Osteoporosis   . Pneumonia 2008  . Pre-diabetes   . Thyroid nodule   . Vitamin D deficiency    Past Surgical History:  Procedure Laterality Date  . ABDOMINAL HYSTERECTOMY  1995  . BILATERAL SALPINGOOPHORECTOMY  1995  . BREAST BIOPSY Left    neg  . BREAST  SURGERY     Benign per pt's report  . CATARACT EXTRACTION  2009  . CESAREAN SECTION  [id number] . COLONOSCOPY  2007  . GANGLION CYST EXCISION    . KNEE ARTHROPLASTY Right 06/29/2016   Procedure: COMPUTER ASSISTED TOTAL KNEE ARTHROPLASTY;  Surgeon: JDereck Leep MD;  Location: [facility] ORS;  Service: Orthopedics;  Laterality: Right;  . KNEE ARTHROSCOPY W/ MENISCAL REPAIR Right 04/2012   Dr. HMarry Guan . UTERINE FIBROID SURGERY     x 5   Family History  Problem Relation Age of Onset  . Anemia Mother        aplastic  . Aplastic anemia Mother   . Throat cancer Father   . Hypertension Father   . Alcohol abuse Father   . COPD Father   . Diabetes Sister   . Esophageal cancer Brother   . Colon polyps Sister   . Diabetes Brother   . Multiple myeloma Brother   . Diabetes Brother   . Colon cancer Maternal Aunt   . Breast cancer Neg Hx    Social History   Socioeconomic History  . Marital status: Divorced    Spouse name: Not on file  . Number of children: 2  . Years of education: Not on file  . Highest education level: Not on file  Occupational History  . Occupation: Retired - ADesigner, jewellery Social Needs  . Financial resource strain: Not hard  at all  . Food insecurity    Worry: Never true    Inability: Never true  . Transportation needs    Medical: No    Non-medical: No  Tobacco Use  . Smoking status: Former Smoker    Packs/day: 2.00    Years: 18.50    Pack years: 37.00    Types: Cigarettes    Quit date: 03/18/2006    Years since quitting: 12.6  . Smokeless tobacco: Never Used  Substance and Sexual Activity  . Alcohol use: Yes    Alcohol/week: 0.0 - 2.0 standard drinks    Comment: Occasional glass of wine, sometimes 2 in a week, sometimes 1 every couple of months.  . Drug use: No  . Sexual activity: Never  Lifestyle  . Physical activity    Days per week: 3 days    Minutes per session: 90 min  . Stress: Not at all  Relationships  . Social Product manager on phone: Not on file    Gets together: Not on file    Attends religious service: Not on file    Active member of club or organization: Not on file    Attends meetings of clubs or organizations: Not on file    Relationship status: Not on file  Other Topics Concern  . Not on file  Social History Narrative   Regular Exercise -  Occasional   Daily Caffeine Use:  2 cups coffee & Diet Mt Dew    Outpatient Encounter Medications as of 11/02/2018  Medication Sig  . amLODipine (NORVASC) 5 MG tablet TAKE 1 TABLET EVERY DAY  . Biotin w/ Vitamins C & E (HAIR/SKIN/NAILS PO) Take 2 capsules by mouth at bedtime.  . Calcium-Magnesium (CAL-MAG PO) Take 2 tablets by mouth at bedtime.  . cetirizine (ZYRTEC) 10 MG tablet Take 1 tablet (10 mg total) by mouth daily. (Patient taking differently: Take 10 mg by mouth daily as needed (for allergies.). )  . Cholecalciferol (VITAMIN D3 PO) Take 2,500 Units by mouth at bedtime.  . COLLAGEN PO Take 1 Dose by mouth daily. Marine Collagen Peptide with MSM 1 scoop a day in hot coffee.  . conjugated estrogens (PREMARIN) vaginal cream Place 1 Applicatorful vaginally 2 (two) times a week.  . hydrochlorothiazide (MICROZIDE) 12.5 MG capsule Take 1 capsule (12.5 mg total) by mouth daily as needed (for swelling/fluid retention.).  [name] Omega-3 Fatty Acids (FISH OIL ULTRA) 1400 MG CAPS Take 1,400 mg by mouth at bedtime.  . pantoprazole (PROTONIX) 40 MG tablet TAKE 1 TABLET EVERY DAY  . traMADol (ULTRAM) 50 MG tablet Take 1 tablet (50 mg total) by mouth every 8 (eight) hours as needed.  . triamcinolone (NASACORT AQ) 55 MCG/ACT AERO nasal inhaler Place 2 sprays into the nose as needed.  . TURMERIC PO Take 200 mg by mouth at bedtime.   No facility-administered encounter medications on file as of 11/02/2018.     Activities of Daily Living In your present state of health, do you have any difficulty performing the following activities: 11/02/2018  Hearing? Y  Comment Hearing  aid, L ear  Vision? N  Difficulty concentrating or making decisions? N  Walking or climbing stairs? N  Dressing or bathing? N  Doing errands, shopping? N  Preparing Food and eating ? N  Using the Toilet? N  In the past six months, have you accidently leaked urine? N  Do you have problems with loss of bowel control? N  Managing your Medications?  N  Managing your Finances? N  Housekeeping or managing your Housekeeping? N  Some recent data might be hidden    Patient Care Team: Leone Haven, MD as PCP - General (Family Medicine)    Assessment:   This is a routine wellness examination for [place].  I connected with patient 11/02/18 at  1:00 PM EDT by an audio enabled telemedicine application and verified that I am speaking with the correct person using two identifiers. Patient stated full name and DOB. Patient gave permission to continue with virtual visit. Patient's location was at home and Nurse's location was at [place] office.   Health Screenings  Mammogram 3D- 08/2017; plans to schedule Colonoscopy - 04/2011 CT Chest Lung Screening- 10/2017 Bone Density - 10/2017 Glaucoma -none Hearing - hearing aid, L ear Hemoglobin A1C - 07/2018 (6.3) Cholesterol - 08/2017  Dental- UTD Vision- visits within the last 12 months  Social  Alcohol intake - yes      Smoking history- former   Smokers in home? none Illicit drug use? none Exercise - water aerobics 3 days weekly, 90 minutes Diet - regular  Sexually Active -never BMI- discussed the importance of a healthy diet, water intake and the benefits of aerobic exercise.  Educational material provided.   Safety  Patient feels safe at home- yes Patient does have smoke detectors at home- yes Patient does wear sunscreen or protective clothing when in direct sunlight -yes Patient does wear seat belt when in a moving vehicle -yes Patient drives- yes  [id number]-40 precautions and sickness symptoms discussed.   Activities of Daily Living  Patient denies needing assistance with: driving, household chores, feeding themselves, getting from bed to chair, getting to the toilet, bathing/showering, dressing, managing money, or preparing meals.  No new identified risk were noted.    Depression Screen Patient denies losing interest in daily life, feeling hopeless, or crying easily over simple problems.   Medication-taking as directed and without issues.   Fall Screen Patient denies being afraid of falling or falling in the last year.   Memory Screen Patient is alert.  Patient denies difficulty focusing, concentrating or misplacing items. Correctly identified the president of the [place], season and recall. Patient likes to read, plays computer games, and/or work puzzles for brain stimulation.  Immunizations The following Immunizations were discussed: Influenza, shingles, pneumonia, and tetanus.   Other Providers Patient Care Team: Leone Haven, MD as PCP - General (Family Medicine)  Exercise Activities and Dietary recommendations Current Exercise Habits: Home exercise routine, Time (Minutes): > 60, Frequency (Times/Week): 3, Weekly Exercise (Minutes/Week): 0, Intensity: Mild  Goals      Patient Stated   . DIET - INCREASE LEAN PROTEINS (pt-stated)     Low carb diet Add protein drink    . Increase physical activity (pt-stated)     Water aerobics 4 days weekly, 90 minutes       Fall Risk Fall Risk  11/02/2018 11/04/2016 07/06/2016 07/12/2015 06/08/2014  Falls in the past year? 0 No No No No  Number falls in past yr: - - - - -  Injury with Fall? - - - - -  Risk Factor Category  - - - - -  Risk for fall due to : - - Impaired mobility - -  Is the patient's home free of loose throw rugs in walkways, pet beds, electrical cords, etc? yes      Grab bars in the bathroom? yes      Handrails on the stairs? yes  Adequate lighting? yes  Depression Screen PHQ 2/9 Scores 11/02/2018 11/04/2016 07/06/2016 07/12/2015  PHQ - 2 Score 0  0 0 0  PHQ- 9 Score - 0 - -     Cognitive Function MMSE - Mini Mental State Exam 11/04/2016  Orientation to time 5  Orientation to Place 5  Registration 3  Attention/ Calculation 5  Recall 3  Language- name 2 objects 2  Language- repeat 1  Language- follow 3 step command 3  Language- read & follow direction 1  Write a sentence 1  Copy design 1  Total score 30     6CIT Screen 11/02/2018  What Year? 0 points  What month? 0 points  What time? 0 points  Count back from 20 0 points  Months in reverse 0 points  Repeat phrase 0 points  Total Score 0    Immunization History  Administered Date(s) Administered  . Influenza Split 03/19/2011, 01/05/2012  . Influenza,inj,Quad PF,6+ Mos 12/21/2013  . Influenza-Unspecified 01/11/2015  . Pneumococcal Conjugate-13 06/08/2014  . Pneumococcal Polysaccharide-23 03/24/2012  . Tdap 05/07/2006, 10/13/2017  . Zoster 06/30/2012   Screening Tests Health Maintenance  Topic Date Due  . INFLUENZA VACCINE  11/05/2018  . MAMMOGRAM  09/03/2019  . COLONOSCOPY  04/30/2021  . TETANUS/TDAP  10/14/2027  . DEXA SCAN  Completed  . Hepatitis C Screening  Completed  . PNA vac Low Risk Adult  Completed      Plan:    End of life planning; Advance aging; Advanced directives discussed.  Copy of current HCPOA/Living Will requested upon completion.    I have personally reviewed and noted the following in the patient's chart:   . Medical and social history . Use of alcohol, tobacco or illicit drugs  . Current medications and supplements . Functional ability and status . Nutritional status . Physical activity . Advanced directives . List of other physicians . Hospitalizations, surgeries, and ER visits in previous 12 months . Vitals . Screenings to include cognitive, depression, and falls . Referrals and appointments  In addition, I have reviewed and discussed with patient certain preventive protocols, quality metrics, and best practice  recommendations. A written personalized care plan for preventive services as well as general preventive health recommendations were provided to patient.     Varney Biles, LPN  07/03/1914

## 2018-11-02 NOTE — Patient Instructions (Addendum)
  Ms. Hallinan , Thank you for taking time to come for your Medicare Wellness Visit. I appreciate your ongoing commitment to your health goals. Please review the following plan we discussed and let me know if I can assist you in the future.   These are the goals we discussed: Goals      Patient Stated   . DIET - INCREASE LEAN PROTEINS (pt-stated)     Low carb diet Add protein drink    . Increase physical activity (pt-stated)     Water aerobics 4 days weekly, 90 minutes       This is a list of the screening recommended for you and due dates:  Health Maintenance  Topic Date Due  . Flu Shot  11/05/2018  . Mammogram  09/03/2019  . Colon Cancer Screening  04/30/2021  . Tetanus Vaccine  10/14/2027  . DEXA scan (bone density measurement)  Completed  .  Hepatitis C: One time screening is recommended by Center for Disease Control  (CDC) for  adults born from [birth year] through 1964.   Completed  . Pneumonia vaccines  Completed

## 2018-11-03 ENCOUNTER — Encounter: Payer: Self-pay | Admitting: Family Medicine

## 2018-11-05 IMAGING — DX DG KNEE 1-2V PORT*R*
2 series · 2 of 2 positions shown · non-contrast
Comparison: None.

CLINICAL DATA: Right total knee arthroplasty

EXAM:
PORTABLE RIGHT KNEE - 1-2 VIEW

[knee ap]
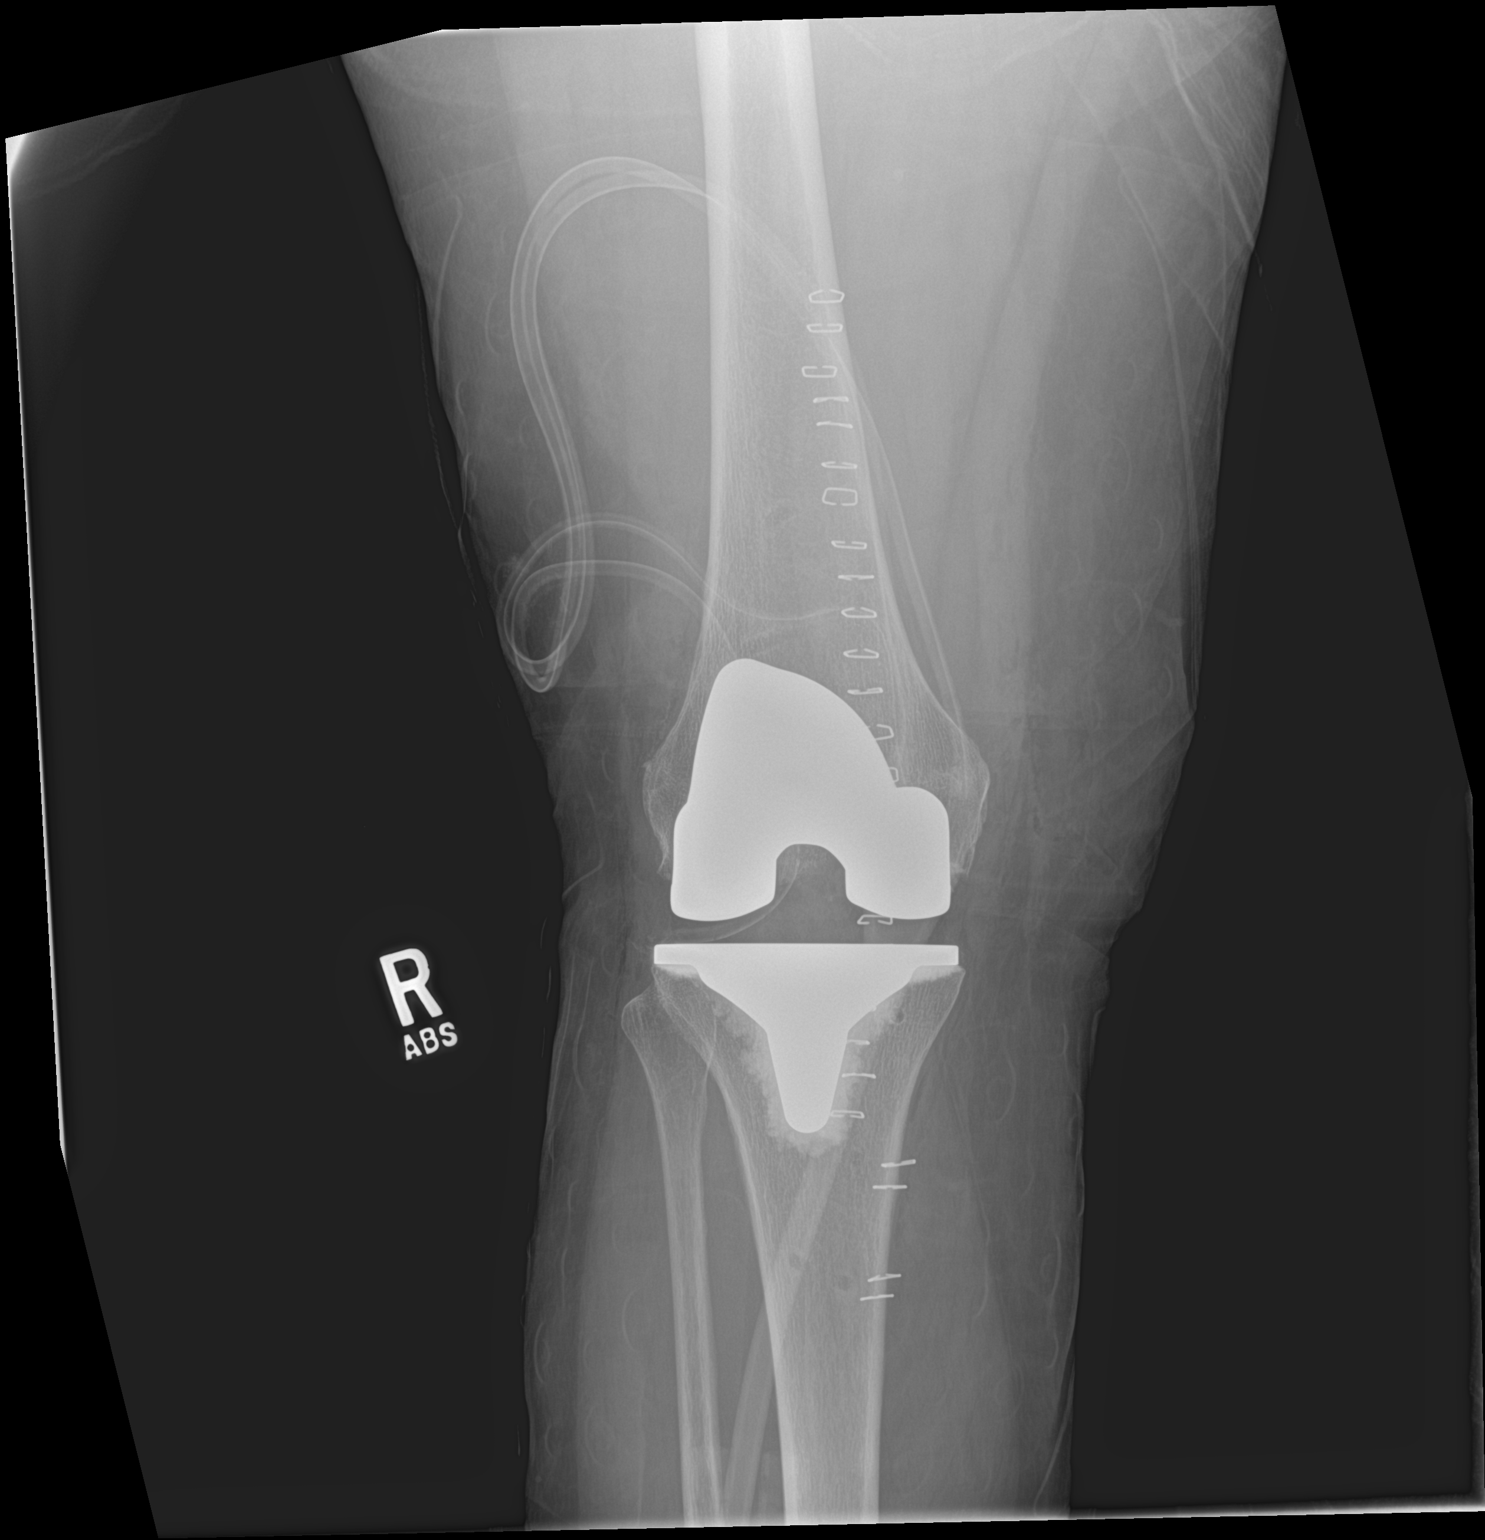

[knee lat]
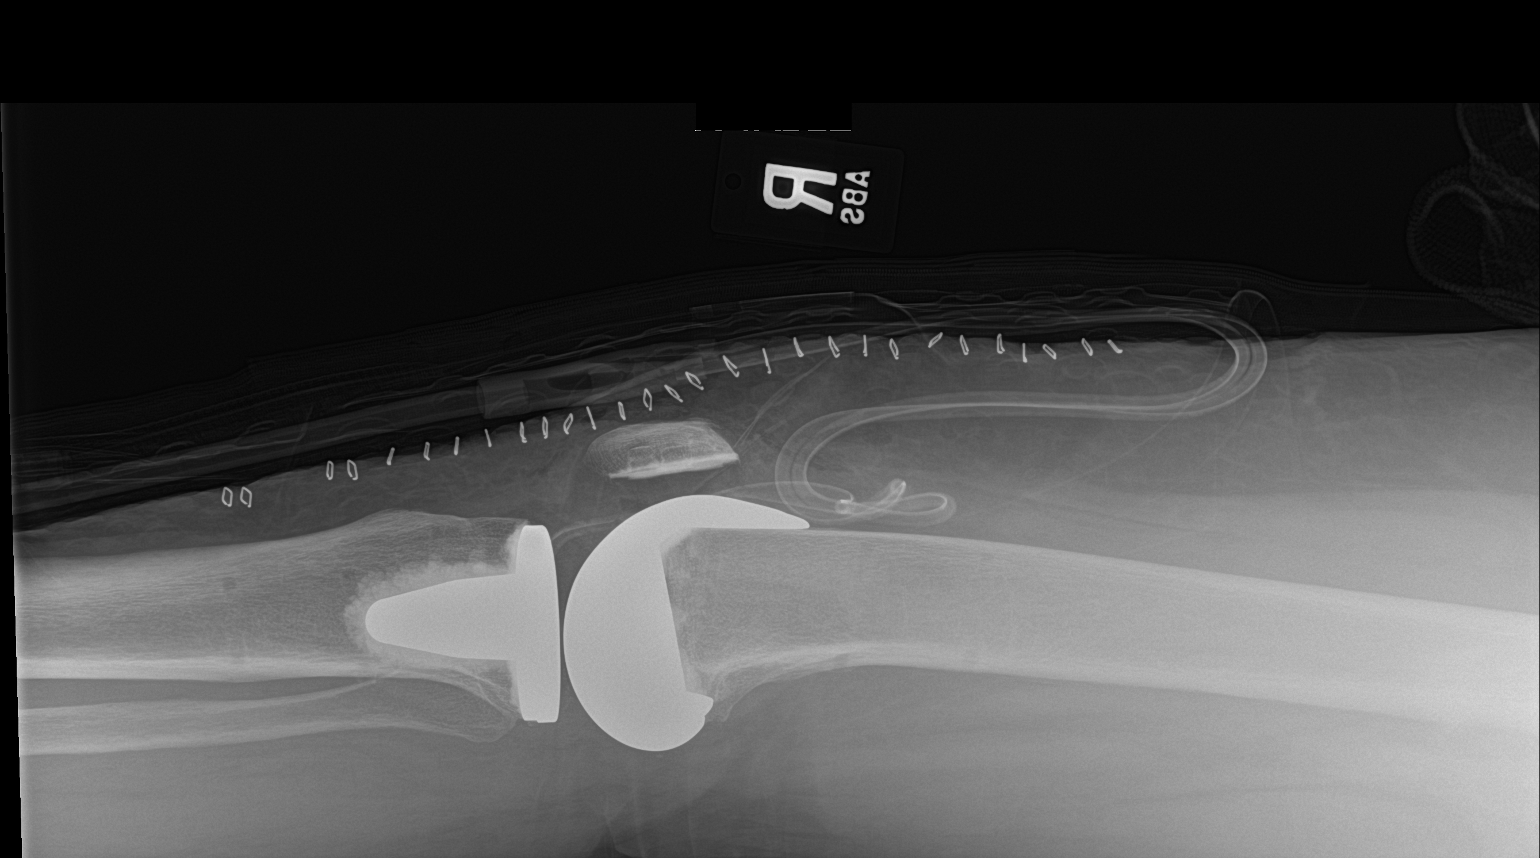

[2 of 2 positions shown; findings below may reference images not displayed]

FINDINGS: Status post right total knee arthroplasty, with well-positioned
right distal femoral, right proximal tibial and patellar prostheses.
No osseous fracture. No dislocation. No suspicious focal osseous
lesion. Apparent ghost holes in the right distal femoral and right
proximal tibial metaphyses. Surgical drain terminates over the right
knee joint. Skin staples overlie the right knee anteriorly. Expected
scattered mild gas surrounding the right knee joint.
IMPRESSION: Satisfactory immediate postoperative appearance status post right
total knee arthroplasty.

## 2018-11-07 ENCOUNTER — Ambulatory Visit: Payer: Medicare HMO | Admitting: Family Medicine

## 2018-11-24 ENCOUNTER — Other Ambulatory Visit: Payer: Self-pay | Admitting: Family Medicine

## 2018-11-24 DIAGNOSIS — Z1231 Encounter for screening mammogram for malignant neoplasm of breast: Secondary | ICD-10-CM

## 2019-01-03 ENCOUNTER — Ambulatory Visit
Admission: RE | Admit: 2019-01-03 | Discharge: 2019-01-03 | Disposition: A | Discharge status: Home / Self Care | Payer: Medicare HMO | Source: Ambulatory Visit | Attending: Family Medicine | Admitting: Family Medicine

## 2019-01-03 DIAGNOSIS — Z1231 Encounter for screening mammogram for malignant neoplasm of breast: Secondary | ICD-10-CM | POA: Insufficient documentation

## 2019-01-06 ENCOUNTER — Encounter: Payer: Self-pay | Admitting: *Deleted

## 2019-01-06 DIAGNOSIS — Z87891 Personal history of nicotine dependence: Secondary | ICD-10-CM

## 2019-01-06 DIAGNOSIS — Z122 Encounter for screening for malignant neoplasm of respiratory organs: Secondary | ICD-10-CM

## 2019-01-09 ENCOUNTER — Encounter: Payer: Self-pay | Admitting: Family Medicine

## 2019-01-18 ENCOUNTER — Encounter: Payer: Self-pay | Admitting: *Deleted

## 2019-01-23 ENCOUNTER — Other Ambulatory Visit: Payer: Self-pay | Admitting: Family Medicine

## 2019-01-26 ENCOUNTER — Telehealth: Payer: Self-pay | Admitting: Family Medicine

## 2019-01-26 NOTE — Telephone Encounter (Signed)
Error

## 2019-01-26 NOTE — Telephone Encounter (Signed)
Patient has been notified that annual lung cancer screening low dose CT scan is due currently or will be in near future. Confirmed that patient is within the age range of 55-77, and asymptomatic, (no signs or symptoms of lung cancer). Patient denies illness that would prevent curative treatment for lung cancer if found. Verified smoking history, (former, quit 2007, 37 pack year ). The shared decision making visit was done 10/05/17. Patient is agreeable for CT scan being scheduled.

## 2019-01-27 ENCOUNTER — Encounter: Payer: Self-pay | Admitting: Family Medicine

## 2019-02-06 ENCOUNTER — Other Ambulatory Visit: Payer: Self-pay

## 2019-02-06 ENCOUNTER — Ambulatory Visit
Admission: RE | Admit: 2019-02-06 | Discharge: 2019-02-06 | Disposition: A | Discharge status: Home / Self Care | Payer: Medicare HMO | Source: Ambulatory Visit | Attending: Nurse Practitioner | Admitting: Nurse Practitioner

## 2019-02-06 DIAGNOSIS — Z87891 Personal history of nicotine dependence: Secondary | ICD-10-CM | POA: Insufficient documentation

## 2019-02-06 DIAGNOSIS — Z122 Encounter for screening for malignant neoplasm of respiratory organs: Secondary | ICD-10-CM | POA: Diagnosis not present

## 2019-02-09 ENCOUNTER — Encounter: Payer: Self-pay | Admitting: *Deleted

## 2019-03-01 ENCOUNTER — Other Ambulatory Visit: Payer: Self-pay

## 2019-03-01 ENCOUNTER — Ambulatory Visit
Admission: EM | Admit: 2019-03-01 | Discharge: 2019-03-01 | Disposition: A | Discharge status: Home / Self Care | Payer: Medicare HMO | Attending: Family Medicine | Admitting: Family Medicine

## 2019-03-01 ENCOUNTER — Telehealth: Payer: Self-pay | Admitting: *Deleted

## 2019-03-01 DIAGNOSIS — N390 Urinary tract infection, site not specified: Secondary | ICD-10-CM | POA: Diagnosis not present

## 2019-03-01 LAB — URINALYSIS, COMPLETE (UACMP) WITH MICROSCOPIC
Bilirubin Urine: NEGATIVE
Glucose, UA: 100 mg/dL — AB
Hgb urine dipstick: NEGATIVE
Ketones, ur: NEGATIVE mg/dL
Nitrite: POSITIVE — AB
Protein, ur: NEGATIVE mg/dL
RBC / HPF: NONE SEEN RBC/hpf (ref 0–5)
Specific Gravity, Urine: <1.005 — ABNORMAL LOW (ref 1.005–1.030)
pH: 5 (ref 5.0–8.0)

## 2019-03-01 MED ORDER — NITROFURANTOIN MONOHYD MACRO 100 MG PO CAPS: 100.0000 mg | ORAL_CAPSULE | Freq: Two times a day (BID) | ORAL | 0 refills | Status: DC

## 2019-03-01 NOTE — Telephone Encounter (Signed)
Copied from [place] [id number]. Topic: General - Inquiry >> Mar 01, 2019  4:21 PM Richardo Priest, [place] wrote: Reason for CRM: Pt called in stating she believes she has a UTI and would like a medication called in. Pt states it is burning and it is very frequent. Pt has had this since Friday. Pt would like a medication sent [id number], [place] [phone] (Phone) [phone] (Fax)

## 2019-03-01 NOTE — Discharge Instructions (Signed)
Increase water intake

## 2019-03-01 NOTE — Telephone Encounter (Signed)
Patient cannot come in for Ua advised UC due to patient is in [place] and cannot come to office for labs to confirm UTI . Patient is going to [place].

## 2019-03-01 NOTE — ED Provider Notes (Signed)
[facility]    CSN: [id number] Arrival date & time: 03/01/19  1735      History   Chief Complaint Chief Complaint  Patient presents with  . Urinary Frequency  . Facial Pain    HPI Amber Perez is a 73 y.o. female.   74 yo female with a c/o frequent urination and pain with urination for 6 days. Denies any fevers, chills, vomiting.    Urinary Frequency    Past Medical History:  Diagnosis Date  . Closed left arm fracture    [facility]   . COPD (chronic obstructive pulmonary disease) ([place])   . Dyslipidemia   . GERD (gastroesophageal reflux disease)   . Hypertension 2008  . Osteoarthritis   . Osteoporosis   . Pneumonia 2008  . Pre-diabetes   . Thyroid nodule   . Vitamin D deficiency     Patient Active Problem List   Diagnosis Date Noted  . Breast pain 07/20/2018  . Dyspnea on exertion 07/14/2018  . Routine general medical examination at a health care facility 08/04/2017  . Rib pain 06/23/2017  . Right knee pain 12/14/2016  . S/P total knee arthroplasty 06/29/2016  . Essential hypertension 01/25/2014  . Obesity (BMI 30-39.9) 01/25/2014  . Atrophic vaginitis 03/24/2012  . Thyroid nodule 11/25/2011  . Osteopenia 11/25/2011  . Hyperlipidemia 11/25/2011  . GERD (gastroesophageal reflux disease) 03/19/2011  . Allergic rhinitis 03/19/2011    Past Surgical History:  Procedure Laterality Date  . ABDOMINAL HYSTERECTOMY  1995  . BILATERAL SALPINGOOPHORECTOMY  1995  . BREAST BIOPSY Left    neg  . BREAST SURGERY     Benign per pt's report  . CATARACT EXTRACTION  2009  . CESAREAN SECTION  [id number]  . COLONOSCOPY  2007  . GANGLION CYST EXCISION    . KNEE ARTHROPLASTY Right 06/29/2016   Procedure: COMPUTER ASSISTED TOTAL KNEE ARTHROPLASTY;  Surgeon: [name], MD;  Location: [facility] ORS;  Service: Orthopedics;  Laterality: Right;  . KNEE ARTHROSCOPY W/ MENISCAL REPAIR Right 04/2012   [name]  . UTERINE FIBROID SURGERY     x 5    OB History   No obstetric history on file.      Home Medications    Prior to Admission medications   Medication Sig Start Date End Date Taking? Authorizing Provider  amLODipine (NORVASC) 5 MG tablet TAKE 1 TABLET EVERY DAY 04/07/18  Yes Einar Pheasant, MD  Calcium-Magnesium (CAL-MAG PO) Take 2 tablets by mouth at bedtime.   Yes [provider]  cetirizine (ZYRTEC) 10 MG tablet Take 1 tablet (10 mg total) by mouth daily. Patient taking differently: Take 10 mg by mouth daily as needed (for allergies.).  07/12/15  Yes Jackolyn Confer, MD  Cholecalciferol (VITAMIN D3 PO) Take 2,500 Units by mouth at bedtime.   Yes [provider]  COLLAGEN PO Take 1 Dose by mouth daily. Marine Collagen Peptide with MSM 1 scoop a day in hot coffee.   Yes [provider]  hydrochlorothiazide (MICROZIDE) 12.5 MG capsule Take 1 capsule (12.5 mg total) by mouth daily as needed (for swelling/fluid retention.). 11/05/16  Yes Cook, Jayce G, DO  Omega-3 Fatty Acids (FISH OIL ULTRA) 1400 MG CAPS Take 1,400 mg by mouth at bedtime.   Yes [provider]  pantoprazole (PROTONIX) 40 MG tablet TAKE 1 TABLET EVERY DAY 04/07/18  Yes Einar Pheasant, MD  PREMARIN vaginal cream PLACE 1 APPLICATORFUL VAGINALLY 2 TIMES A WEEK 01/25/19  Yes Caryl Bis,  Angela Adam, MD  triamcinolone (NASACORT AQ) 55 MCG/ACT AERO nasal inhaler Place 2 sprays into the nose as needed.   Yes [provider]  TURMERIC PO Take 200 mg by mouth at bedtime.   Yes [provider]  Biotin w/ Vitamins C & E (HAIR/SKIN/NAILS PO) Take 2 capsules by mouth at bedtime.    [provider]  nitrofurantoin, macrocrystal-monohydrate, (MACROBID) 100 MG capsule Take 1 capsule (100 mg total) by mouth 2 (two) times daily. 03/01/19   Norval Gable, MD  traMADol (ULTRAM) 50 MG tablet Take 1 tablet (50 mg total) by mouth every 8 (eight) hours as needed. 07/18/18   Leone Haven, MD    Family History Family History   Problem Relation Age of Onset  . Anemia Mother        aplastic  . Aplastic anemia Mother   . Throat cancer Father   . Hypertension Father   . Alcohol abuse Father   . COPD Father   . Diabetes Sister   . Esophageal cancer Brother   . Colon polyps Sister   . Diabetes Brother   . Multiple myeloma Brother   . Diabetes Brother   . Colon cancer Maternal Aunt   . Breast cancer Neg Hx     Social History Social History   Tobacco Use  . Smoking status: Former Smoker    Packs/day: 2.00    Years: 18.50    Pack years: 37.00    Types: Cigarettes    Quit date: 03/18/2006    Years since quitting: 12.9  . Smokeless tobacco: Never Used  Substance Use Topics  . Alcohol use: Yes    Alcohol/week: 0.0 - 2.0 standard drinks    Comment: Occasional glass of wine, sometimes 2 in a week, sometimes 1 every couple of months.  . Drug use: No     Allergies   Penicillins and Bisphosphonates   Review of Systems Review of Systems  Genitourinary: Positive for frequency.     Physical Exam Triage Vital Signs ED Triage Vitals  Enc Vitals Group     BP 03/01/19 1826 (!) 149/68     Pulse Rate 03/01/19 1826 88     Resp 03/01/19 1826 16     Temp 03/01/19 1826 97.8 F (36.6 C)     Temp Source 03/01/19 1826 Oral     SpO2 03/01/19 1826 94 %     Weight 03/01/19 1824 180 lb (81.6 kg)     Height 03/01/19 1824 4' 11.5" (1.511 m)     Head Circumference --      Peak Flow --      Pain Score 03/01/19 1823 4     Pain Loc --      Pain Edu? --      Excl. in [place]? --    No data found.  Updated Vital Signs BP (!) 149/68 (BP Location: Right Arm)   Pulse 88   Temp 97.8 F (36.6 C) (Oral)   Resp 16   Ht 4' 11.5" (1.511 m)   Wt 81.6 kg   SpO2 94%   BMI 35.75 kg/m   Visual Acuity Right Eye Distance:   Left Eye Distance:   Bilateral Distance:    Right Eye Near:   Left Eye Near:    Bilateral Near:     Physical Exam Vitals signs and nursing note reviewed.  Constitutional:      General: She  is not in acute distress.    Appearance: She is  not toxic-appearing or diaphoretic.  Pulmonary:     Effort: Pulmonary effort is normal. No respiratory distress.  Abdominal:     General: There is no distension.     Palpations: Abdomen is soft.     Tenderness: There is no abdominal tenderness. There is no right CVA tenderness or left CVA tenderness.  Neurological:     Mental Status: She is alert.      UC Treatments / Results  Labs (all labs ordered are listed, but only abnormal results are displayed) Labs Reviewed  URINALYSIS, COMPLETE (UACMP) WITH MICROSCOPIC - Abnormal; Notable for the following components:      Result Value   Color, Urine ORANGE (*)    Specific Gravity, Urine <1.005 (*)    Glucose, UA 100 (*)    Nitrite POSITIVE (*)    Leukocytes,Ua TRACE (*)    Bacteria, UA MANY (*)    All other components within normal limits  URINE CULTURE    EKG   Radiology No results found.  Procedures Procedures (including critical care time)  Medications Ordered in UC Medications - No data to display  Initial Impression / Assessment and Plan / UC Course  I have reviewed the triage vital signs and the nursing notes.  Pertinent labs & imaging results that were available during my care of the patient were reviewed by me and considered in my medical decision making (see chart for details).      Final Clinical Impressions(s) / UC Diagnoses   Final diagnoses:  Lower urinary tract infectious disease     Discharge Instructions     Increase water intake    ED Prescriptions    Medication Sig Dispense Auth. Provider   nitrofurantoin, macrocrystal-monohydrate, (MACROBID) 100 MG capsule Take 1 capsule (100 mg total) by mouth 2 (two) times daily. 14 capsule Norval Gable, MD      1. Lab results and diagnosis reviewed with patient 2. rx as per orders above; reviewed possible side effects, interactions, risks and benefits  3. Recommend supportive treatment as above 4.  Follow-up prn if symptoms worsen or don't improve  PDMP not reviewed this encounter.   Norval Gable, MD 03/01/19 [phone]

## 2019-03-01 NOTE — Telephone Encounter (Signed)
Noted [facility] agree  tms

## 2019-03-01 NOTE — ED Triage Notes (Signed)
Patient states that she has been having urinary frequency and pain x 6 days ago. Started taking Clindamycin on Sunday that was called by PCP. Patient states that this has not helped with her symptoms advised to come here tonight.   Patient states that she also sinus congestion x Friday

## 2019-03-04 LAB — URINE CULTURE
Culture: 30000 — AB
Special Requests: NORMAL

## 2019-03-06 ENCOUNTER — Telehealth (HOSPITAL_COMMUNITY): Payer: Self-pay | Admitting: Emergency Medicine

## 2019-03-06 NOTE — Telephone Encounter (Signed)
Urine culture was positive for e coli and was given  macrobid at urgent care visit. Pt contacted and made aware, educated on completing antibiotic and to follow up if symptoms are persistent. Verbalized understanding.    

## 2019-03-07 ENCOUNTER — Encounter: Payer: Self-pay | Admitting: Family Medicine

## 2019-03-08 ENCOUNTER — Telehealth: Payer: Self-pay

## 2019-03-08 ENCOUNTER — Encounter: Payer: Self-pay | Admitting: Family Medicine

## 2019-03-08 DIAGNOSIS — N2889 Other specified disorders of kidney and ureter: Secondary | ICD-10-CM

## 2019-03-08 NOTE — Telephone Encounter (Signed)
Called patient to notify her Dr.Byrnett was no longer here in the office and the other providers here in office does not perform colonoscopies. Patient asked about Dr.Byrnett's office and was provided with his office's information.

## 2019-03-09 NOTE — Telephone Encounter (Addendum)
See other mychart note.

## 2019-03-13 NOTE — Telephone Encounter (Signed)
I called and scheduled a lab appointment with the patient per Dr. Caryl Bis.  Jailyne Chieffo,cma

## 2019-03-13 NOTE — Telephone Encounter (Signed)
Called and scheduled lab with the patient, per Dr. Caryl Bis. Before MRI.  Nina,cma

## 2019-03-14 ENCOUNTER — Other Ambulatory Visit: Payer: Self-pay

## 2019-03-14 ENCOUNTER — Other Ambulatory Visit (INDEPENDENT_AMBULATORY_CARE_PROVIDER_SITE_OTHER): Payer: Medicare HMO

## 2019-03-14 DIAGNOSIS — N2889 Other specified disorders of kidney and ureter: Secondary | ICD-10-CM | POA: Diagnosis not present

## 2019-03-15 LAB — BASIC METABOLIC PANEL
BUN: 15 mg/dL (ref 6–23)
CO2: 28 mEq/L (ref 19–32)
Calcium: 9.4 mg/dL (ref 8.4–10.5)
Chloride: 102 mEq/L (ref 96–112)
Creatinine, Ser: 0.67 mg/dL (ref 0.40–1.20)
GFR: 86.51 mL/min (ref >60.00)
Glucose, Bld: 86 mg/dL (ref 70–99)
Potassium: 4.1 mEq/L (ref 3.5–5.1)
Sodium: 138 mEq/L (ref 135–145)

## 2019-03-17 ENCOUNTER — Encounter: Payer: Self-pay | Admitting: Family Medicine

## 2019-03-29 ENCOUNTER — Telehealth: Payer: Self-pay | Admitting: Family Medicine

## 2019-03-29 ENCOUNTER — Ambulatory Visit
Admission: RE | Admit: 2019-03-29 | Discharge: 2019-03-29 | Disposition: A | Discharge status: Home / Self Care | Payer: Medicare HMO | Source: Ambulatory Visit | Attending: Family Medicine | Admitting: Family Medicine

## 2019-03-29 ENCOUNTER — Encounter: Payer: Medicare HMO | Admitting: Family Medicine

## 2019-03-29 ENCOUNTER — Other Ambulatory Visit: Payer: Self-pay

## 2019-03-29 DIAGNOSIS — N2889 Other specified disorders of kidney and ureter: Secondary | ICD-10-CM

## 2019-03-29 DIAGNOSIS — K8020 Calculus of gallbladder without cholecystitis without obstruction: Secondary | ICD-10-CM | POA: Diagnosis not present

## 2019-03-29 MED ORDER — GADOBUTROL 1 MMOL/ML IV SOLN
8.0000 mL | Freq: Once | INTRAVENOUS | Status: AC | PRN
  Administered 2019-03-29: 11:00:00 8 mL via INTRAVENOUS

## 2019-03-29 NOTE — Telephone Encounter (Signed)
I called and spoke with the patient regarding her MRI results.  Discussed that the finding in her kidney is concerning for kidney cancer.  Discussed that she would need to see urology to discuss whether or not biopsy, partial nephrectomy, or total nephrectomy was needed as well as additional work-up.  She verbalized understanding.  I have placed a referral and messaged to our referral coordinator to send this urgently to urology.

## 2019-04-04 ENCOUNTER — Ambulatory Visit (INDEPENDENT_AMBULATORY_CARE_PROVIDER_SITE_OTHER): Payer: Medicare HMO | Admitting: Family Medicine

## 2019-04-04 ENCOUNTER — Encounter: Payer: Self-pay | Admitting: Family Medicine

## 2019-04-04 ENCOUNTER — Other Ambulatory Visit: Payer: Self-pay

## 2019-04-04 ENCOUNTER — Other Ambulatory Visit
Admission: RE | Admit: 2019-04-04 | Discharge: 2019-04-04 | Disposition: A | Discharge status: Home / Self Care | Payer: Medicare HMO | Source: Ambulatory Visit | Attending: Family Medicine | Admitting: Family Medicine

## 2019-04-04 VITALS — BP 150/90 | HR 81 | Temp 96.5°F | Ht 59.0 in | Wt 186.4 lb

## 2019-04-04 DIAGNOSIS — R0600 Dyspnea, unspecified: Secondary | ICD-10-CM | POA: Diagnosis not present

## 2019-04-04 DIAGNOSIS — R0609 Other forms of dyspnea: Secondary | ICD-10-CM

## 2019-04-04 DIAGNOSIS — R202 Paresthesia of skin: Secondary | ICD-10-CM | POA: Insufficient documentation

## 2019-04-04 DIAGNOSIS — N2889 Other specified disorders of kidney and ureter: Secondary | ICD-10-CM

## 2019-04-04 DIAGNOSIS — I10 Essential (primary) hypertension: Secondary | ICD-10-CM

## 2019-04-04 DIAGNOSIS — M25559 Pain in unspecified hip: Secondary | ICD-10-CM | POA: Diagnosis not present

## 2019-04-04 DIAGNOSIS — R42 Dizziness and giddiness: Secondary | ICD-10-CM | POA: Diagnosis not present

## 2019-04-04 DIAGNOSIS — N3281 Overactive bladder: Secondary | ICD-10-CM | POA: Diagnosis not present

## 2019-04-04 LAB — CBC
HCT: 41.6 % (ref 36.0–46.0)
Hemoglobin: 13.7 g/dL (ref 12.0–15.0)
MCH: 29.7 pg (ref 26.0–34.0)
MCHC: 32.9 g/dL (ref 30.0–36.0)
MCV: 90 fL (ref 80.0–100.0)
Platelets: 265 10*3/uL (ref 150–400)
RBC: 4.62 MIL/uL (ref 3.87–5.11)
RDW: 14.2 % (ref 11.5–15.5)
WBC: 6.2 10*3/uL (ref 4.0–10.5)
nRBC: 0 % (ref 0.0–0.2)

## 2019-04-04 LAB — COMPREHENSIVE METABOLIC PANEL
ALT: 36 U/L (ref 0–44)
AST: 27 U/L (ref 15–41)
Albumin: 4.3 g/dL (ref 3.5–5.0)
Alkaline Phosphatase: 58 U/L (ref 38–126)
Anion gap: 8 (ref 5–15)
BUN: 18 mg/dL (ref 8–23)
CO2: 25 mmol/L (ref 22–32)
Calcium: 9.1 mg/dL (ref 8.9–10.3)
Chloride: 104 mmol/L (ref 98–111)
Creatinine, Ser: 0.56 mg/dL (ref 0.44–1.00)
GFR calc Af Amer: >60 mL/min (ref >60)
GFR calc non Af Amer: >60 mL/min (ref >60)
Glucose, Bld: 106 mg/dL — ABNORMAL HIGH (ref 70–99)
Potassium: 3.9 mmol/L (ref 3.5–5.1)
Sodium: 137 mmol/L (ref 135–145)
Total Bilirubin: 0.4 mg/dL (ref 0.3–1.2)
Total Protein: 7.6 g/dL (ref 6.5–8.1)

## 2019-04-04 LAB — TSH: TSH: 1.663 u[IU]/mL (ref 0.350–4.500)

## 2019-04-04 LAB — HEMOGLOBIN A1C
Hgb A1c MFr Bld: 5.7 % — ABNORMAL HIGH (ref 4.8–5.6)
Mean Plasma Glucose: 116.89 mg/dL

## 2019-04-04 LAB — VITAMIN B12: Vitamin B-12: 251 pg/mL (ref 180–914)

## 2019-04-04 NOTE — Progress Notes (Signed)
Virtual Visit via telephone Note  This visit type was conducted due to national recommendations for restrictions regarding the COVID-19 pandemic (e.g. social distancing).  This format is felt to be most appropriate for this patient at this time.  All issues noted in this document were discussed and addressed.  No physical exam was performed (except for noted visual exam findings with Video Visits).   I connected with  today at 10:00 AM EST by telephone and verified that I am speaking with the correct person using two identifiers. Location patient: car Location provider: work  Persons participating in the virtual visit: patient, provider  I discussed the limitations, risks, security and privacy concerns of performing an evaluation and management service by telephone and the availability of in person appointments. I also discussed with the patient that there may be a patient responsible charge related to this service. The patient expressed understanding and agreed to proceed.  Interactive audio and video telecommunications were attempted between this provider and patient, however failed, due to patient having technical difficulties OR patient did not have access to video capability.  We continued and completed visit with audio only.  Reason for visit: Follow-up.  HPI: Dyspnea on exertion: This has been a chronic issue.  It occurs intermittently.  She can go a number of days without having any issues and then she will have some dyspnea.  Notes it has been worse over the last 2 to 3 months.  No chest pain.  No cough.  She does note some wheezing in the past though not recently.  She will rest and it will resolve quickly.  Does have a history of smoking and quit in 2007.  Minor edema though not much lately.  No orthopnea or PND.  Lightheadedness: This occurred on Christmas Eve.  She had not had much to drink and had been sitting in the car.  No vertiginous symptoms.  Has not recurred.  Left finger  numbness: This is occurring in the ring and middle fingertips.  This has been going on for several months.  It has not worsened or improved.  No numbness elsewhere.  Chronic hand weakness though no other weakness.  Urinary frequency: Patient continues to have some frequency and urgency.  She will have some urge incontinence.  No dysuria.  No hematuria.  No abdominal pain.  She had symptoms that were much worse previously and improved with antibiotics.  Her symptoms are now back to her pre-UTI symptoms.  Hip pain: She feels as though she has bursitis.  She has discomfort in bilateral hips when laying on her sides.  She alternates sides at night.  She tried ibuprofen and Tylenol with little benefit.  She is using something called hempvana.  Kidney mass: Recent MRI concerning for renal cell carcinoma.  She has been referred to urology though they cannot get her in for some time.  She called [facility] and notes they just need a referral and they can see her much quicker.  Patient notes her BP is typically in the 130s over 80s.  Though she does not check very often.  ROS: See pertinent positives and negatives per HPI.  Past Medical History:  Diagnosis Date   Closed left arm fracture    [facility]    COPD (chronic obstructive pulmonary disease) ([place])    Dyslipidemia    GERD (gastroesophageal reflux disease)    Hypertension 2008   Osteoarthritis    Osteoporosis    Pneumonia 2008   Pre-diabetes  Thyroid nodule    Vitamin D deficiency     Past Surgical History:  Procedure Laterality Date   ABDOMINAL HYSTERECTOMY  1995   BILATERAL SALPINGOOPHORECTOMY  1995   BREAST BIOPSY Left    neg   BREAST SURGERY     Benign per pt's report   CATARACT EXTRACTION  2009   CESAREAN SECTION  6579,0383   COLONOSCOPY  2007   GANGLION CYST EXCISION     KNEE ARTHROPLASTY Right 06/29/2016   Procedure: COMPUTER ASSISTED TOTAL KNEE ARTHROPLASTY;  Surgeon: [name], MD;   Location: [facility] ORS;  Service: Orthopedics;  Laterality: Right;   KNEE ARTHROSCOPY W/ MENISCAL REPAIR Right 04/2012   [name]   UTERINE FIBROID SURGERY     x 5    Family History  Problem Relation Age of Onset   Anemia Mother        aplastic   Aplastic anemia Mother    Throat cancer Father    Hypertension Father    Alcohol abuse Father    COPD Father    Diabetes Sister    Esophageal cancer Brother    Colon polyps Sister    Diabetes Brother    Multiple myeloma Brother    Diabetes Brother    Colon cancer Maternal Aunt    Breast cancer Neg Hx     SOCIAL HX: Former smoker   Current Outpatient Medications:    amLODipine (NORVASC) 5 MG tablet, TAKE 1 TABLET EVERY DAY, Disp: 90 tablet, Rfl: 3   Biotin w/ Vitamins C & E (HAIR/SKIN/NAILS PO), Take 2 capsules by mouth at bedtime., Disp: , Rfl:    Calcium-Magnesium (CAL-MAG PO), Take 2 tablets by mouth at bedtime., Disp: , Rfl:    cetirizine (ZYRTEC) 10 MG tablet, Take 1 tablet (10 mg total) by mouth daily. (Patient taking differently: Take 10 mg by mouth daily as needed (for allergies.). ), Disp: 90 tablet, Rfl: 4   Cholecalciferol (VITAMIN D3 PO), Take 2,500 Units by mouth at bedtime., Disp: , Rfl:    COLLAGEN PO, Take 1 Dose by mouth daily. Marine Collagen Peptide with MSM 1 scoop a day in hot coffee., Disp: , Rfl:    Omega-3 Fatty Acids (FISH OIL ULTRA) 1400 MG CAPS, Take 1,400 mg by mouth at bedtime., Disp: , Rfl:    pantoprazole (PROTONIX) 40 MG tablet, TAKE 1 TABLET EVERY DAY, Disp: 90 tablet, Rfl: 3   PREMARIN vaginal cream, PLACE 1 APPLICATORFUL VAGINALLY 2 TIMES A WEEK, Disp: 30 g, Rfl: 1   traMADol (ULTRAM) 50 MG tablet, Take 1 tablet (50 mg total) by mouth every 8 (eight) hours as needed., Disp: 10 tablet, Rfl: 0   triamcinolone (NASACORT AQ) 55 MCG/ACT AERO nasal inhaler, Place 2 sprays into the nose as needed., Disp: , Rfl:    TURMERIC PO, Take 200 mg by mouth at bedtime., Disp: , Rfl:    EXAM:  This was a telehealth telephone visit and thus no physical exam was completed.  ASSESSMENT AND PLAN:  Discussed the following assessment and plan:  Essential hypertension Adequate control at home.  She will monitor BPs at home and send [place] a list of her readings in 7 days.  Dyspnea on exertion Chronic issue.  She will likely be having surgery for her kidney mass and thus I will refer her to cardiology as she will need to be set up with them for clearance.  We will also obtain PFTs.  Hip pain Likely bursitis.  Consider referral to orthopedics  in the future if she would like.  She will send [place] a picture of the topical treatment she is currently using so I can see if it is okay to use.  Overactive bladder Symptoms are likely related to overactive bladder.  Discussed checking your blood pressure and letting me know her readings.  Discussed treatment with Myrbetriq though I need to know what her blood pressures are consistently prior to starting this medication.  Tingling Fingertips as described above.  Will obtain B12 and A1c.  Discussed it could be a nerve impingement as well.  Kidney mass Referral placed to [facility] urology.  Message sent to referral staff to get this sent to them.  Advised the patient to wait a day or 2 and then contact [facility] to schedule an appointment as they would then have the referral.  Lightheadedness Suspect this is related to poor fluid intake.  She will monitor for recurrence.    I discussed the assessment and treatment plan with the patient. The patient was provided an opportunity to ask questions and all were answered. The patient agreed with the plan and demonstrated an understanding of the instructions.   The patient was advised to call back or seek an in-person evaluation if the symptoms worsen or if the condition fails to improve as anticipated.  I provided 35 minutes of non-face-to-face time during this encounter.   [name], MD

## 2019-04-04 NOTE — Progress Notes (Deleted)
[name], MD Phone: [phone]  Amber Perez is a 73 y.o. female who presents today for ***  ***  Active Ambulatory Problems    Diagnosis Date Noted  . GERD (gastroesophageal reflux disease) 03/19/2011  . Allergic rhinitis 03/19/2011  . Thyroid nodule 11/25/2011  . Osteopenia 11/25/2011  . Hyperlipidemia 11/25/2011  . Atrophic vaginitis 03/24/2012  . Essential hypertension 01/25/2014  . Obesity (BMI 30-39.9) 01/25/2014  . S/P total knee arthroplasty 06/29/2016  . Right knee pain 12/14/2016  . Rib pain 06/23/2017  . Routine general medical examination at a health care facility 08/04/2017  . Dyspnea on exertion 07/14/2018  . Breast pain 07/20/2018   Resolved Ambulatory Problems    Diagnosis Date Noted  . Osteoporosis 03/19/2011  . Welcome to Medicare preventive visit 03/24/2012  . Osteoarthritis of right knee 03/24/2012  . Medicare annual wellness visit, subsequent 06/20/2013  . Family history of diabetes mellitus 06/20/2013  . Elevated BP 12/21/2013  . Elevated LFTs 12/21/2013  . Acute maxillary sinusitis 06/21/2014   Past Medical History:  Diagnosis Date  . Closed left arm fracture   . COPD (chronic obstructive pulmonary disease) ([place])   . Dyslipidemia   . Hypertension 2008  . Osteoarthritis   . Pneumonia 2008  . Pre-diabetes   . Vitamin D deficiency     Family History  Problem Relation Age of Onset  . Anemia Mother        aplastic  . Aplastic anemia Mother   . Throat cancer Father   . Hypertension Father   . Alcohol abuse Father   . COPD Father   . Diabetes Sister   . Esophageal cancer Brother   . Colon polyps Sister   . Diabetes Brother   . Multiple myeloma Brother   . Diabetes Brother   . Colon cancer Maternal Aunt   . Breast cancer Neg Hx     Social History   Socioeconomic History  . Marital status: Divorced    Spouse name: Not on file  . Number of children: 2  . Years of education: Not on file  . Highest education level:  Not on file  Occupational History  . 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 Smoking status: Former Smoker    Packs/day: 2.00    Years: 18.50    Pack years: 37.00    Types: Cigarettes    Quit date: 03/18/2006    Years since quitting: 13.0  . Smokeless tobacco: Never Used  Substance and Sexual Activity  . Alcohol use: Yes    Alcohol/week: 0.0 - 2.0 standard drinks    Comment: Occasional glass of wine, sometimes 2 in a week, sometimes 1 every couple of months.  . Drug use: No  . Sexual activity: Never  Other Topics Concern  . Not on file  Social History Narrative   Regular Exercise -  Occasional   Daily Caffeine Use:  2 cups coffee & Diet Mt Dew   Social Determinants of Health   Financial Resource Strain: Low Risk   . Difficulty of Paying Living Expenses: Not hard at all  Food Insecurity: No Food Insecurity  . Worried About Charity fundraiser in the Last Year: Never true  . Ran Out of Food in the Last Year: Never true  Transportation Needs: No Transportation Needs  . Lack of Transportation (Medical): No  . Lack of Transportation (Non-Medical): No  Physical Activity: Sufficiently Active  . Days of Exercise per Week: 3 days  .  Minutes of Exercise per Session: 90 min  Stress: No Stress Concern Present  . Feeling of Stress : Not at all  Social Connections:   . Frequency of Communication with Friends and Family: Not on file  . Frequency of Social Gatherings with Friends and Family: Not on file  . Attends Religious Services: Not on file  . Active Member of Clubs or Organizations: Not on file  . Attends Archivist Meetings: Not on file  . Marital Status: Not on file  Intimate Partner Violence: Unknown  . Fear of Current or Ex-Partner: Not asked  . Emotionally Abused: Not on file  . Physically Abused: Not on file  . Sexually Abused: Not on file    ROS  General:  Negative for nexplained weight loss, fever Skin: Negative for new or changing  mole, sore that won't heal HEENT: Negative for trouble hearing, trouble seeing, ringing in ears, mouth sores, hoarseness, change in voice, dysphagia. CV:  Negative for chest pain, dyspnea, edema, palpitations Resp: Negative for cough, dyspnea, hemoptysis GI: Negative for nausea, vomiting, diarrhea, constipation, abdominal pain, melena, hematochezia. GU: Negative for dysuria, incontinence, urinary hesitance, hematuria, vaginal or penile discharge, polyuria, sexual difficulty, lumps in testicle or breasts MSK: Negative for muscle cramps or aches, joint pain or swelling Neuro: Negative for headaches, weakness, numbness, dizziness, passing out/fainting Psych: Negative for depression, anxiety, memory problems  Objective  Physical Exam There were no vitals filed for this visit.  BP Readings from Last 3 Encounters:  03/01/19 (!) 149/68  07/19/18 140/82  10/26/17 140/82   Wt Readings from Last 3 Encounters:  03/01/19 180 lb (81.6 kg)  02/06/19 179 lb (81.2 kg)  07/19/18 189 lb 3.2 oz (85.8 kg)    Physical Exam   Assessment/Plan:   No problem-specific Assessment & Plan notes found for this encounter.   No orders of the defined types were placed in this encounter.   No orders of the defined types were placed in this encounter.   This visit occurred during the SARS-CoV-2 public health emergency.  Safety protocols were in place, including screening questions prior to the visit, additional usage of staff PPE, and extensive cleaning of exam room while observing appropriate contact time as indicated for disinfecting solutions.    [name], MD [place]

## 2019-04-05 DIAGNOSIS — N3281 Overactive bladder: Secondary | ICD-10-CM | POA: Insufficient documentation

## 2019-04-05 DIAGNOSIS — R202 Paresthesia of skin: Secondary | ICD-10-CM | POA: Insufficient documentation

## 2019-04-05 DIAGNOSIS — N2889 Other specified disorders of kidney and ureter: Secondary | ICD-10-CM | POA: Insufficient documentation

## 2019-04-05 DIAGNOSIS — C649 Malignant neoplasm of unspecified kidney, except renal pelvis: Secondary | ICD-10-CM | POA: Insufficient documentation

## 2019-04-05 DIAGNOSIS — M25559 Pain in unspecified hip: Secondary | ICD-10-CM | POA: Insufficient documentation

## 2019-04-05 DIAGNOSIS — R42 Dizziness and giddiness: Secondary | ICD-10-CM | POA: Insufficient documentation

## 2019-04-05 NOTE — Assessment & Plan Note (Signed)
Likely bursitis.  Consider referral to orthopedics in the future if she would like.  She will send [place] a picture of the topical treatment she is currently using so I can see if it is okay to use.

## 2019-04-05 NOTE — Assessment & Plan Note (Signed)
Symptoms are likely related to overactive bladder.  Discussed checking your blood pressure and letting me know her readings.  Discussed treatment with Myrbetriq though I need to know what her blood pressures are consistently prior to starting this medication.

## 2019-04-05 NOTE — Assessment & Plan Note (Addendum)
Adequate control at home.  She will monitor BPs at home and send [place] a list of her readings in 7 days.

## 2019-04-05 NOTE — Assessment & Plan Note (Signed)
Suspect this is related to poor fluid intake.  She will monitor for recurrence.

## 2019-04-05 NOTE — Assessment & Plan Note (Signed)
Chronic issue.  She will likely be having surgery for her kidney mass and thus I will refer her to cardiology as she will need to be set up with them for clearance.  We will also obtain PFTs.

## 2019-04-05 NOTE — Assessment & Plan Note (Signed)
Fingertips as described above.  Will obtain B12 and A1c.  Discussed it could be a nerve impingement as well.

## 2019-04-05 NOTE — Assessment & Plan Note (Signed)
Referral placed to [facility].  Message sent to referral staff to get this sent to them.  Advised the patient to wait a day or 2 and then contact [facility] to schedule an appointment as they would then have the referral.

## 2019-04-06 ENCOUNTER — Encounter: Payer: Self-pay | Admitting: Family Medicine

## 2019-04-08 NOTE — Progress Notes (Signed)
Cardiology Office Note  Date:  04/10/2019   ID:  Amber, Perez Aug 15, 1945, MRN [id number]  PCP:  Leone Haven, MD   Chief Complaint  Patient presents with  . New Patient (Initial Visit)    DOE; Meds verbally reviewed with patient.    HPI:  [name] is a 74 year old woman with past medical history of HTN history of smoking and quit in 2007 COPD, on CT Referred by Dr. Caryl Bis for shortness of breath  Reports recent use of a Kidney mass MRI concerning for renal cell carcinoma Concerned about the findings  CT chest : 02/06/2019, images pulled up and reviewed with her showing minimal coronary calcification, very mild Aortic Atherosclerosis , COPD  She has had SOB past several months, was worse when her weigh was 190 When weight was 190 she started walking with her daughter and was very symptomatic Changed her diet, weight is lower using weight watchers, no shortness of breath has resolved  Previously eating out a lot  Changed her diet Weight down on weight watchers  Denies any chest pain concerning for angina REDS VEST score of 47  EKG personally reviewed by myself on todays visit Shows normal sinus rhythm rate 87 bpm no significant ST-T wave changes   PMH:   has a past medical history of Closed left arm fracture, COPD (chronic obstructive pulmonary disease) ([place]), Dyslipidemia, GERD (gastroesophageal reflux disease), Hypertension (2008), Osteoarthritis, Osteoporosis, Pneumonia (2008), Pre-diabetes, Thyroid nodule, and Vitamin D deficiency.  PSH:    Past Surgical History:  Procedure Laterality Date  . ABDOMINAL HYSTERECTOMY  1995  . BILATERAL SALPINGOOPHORECTOMY  1995  . BREAST BIOPSY Left    neg  . BREAST SURGERY     Benign per pt's report  . CATARACT EXTRACTION  2009  . CESAREAN SECTION  [id number]  . COLONOSCOPY  2007  . GANGLION CYST EXCISION    . KNEE ARTHROPLASTY Right 06/29/2016   Procedure: COMPUTER ASSISTED TOTAL KNEE ARTHROPLASTY;   Surgeon: [name], MD;  Location: [facility] ORS;  Service: Orthopedics;  Laterality: Right;  . KNEE ARTHROSCOPY W/ MENISCAL REPAIR Right 04/2012   [name]  . UTERINE FIBROID SURGERY     x 5    Current Outpatient Medications  Medication Sig Dispense Refill  . amLODipine (NORVASC) 5 MG tablet TAKE 1 TABLET EVERY DAY 90 tablet 3  . Calcium-Magnesium (CAL-MAG PO) Take 2 tablets by mouth at bedtime. Takes occasionally    . cetirizine (ZYRTEC) 10 MG tablet Take 1 tablet (10 mg total) by mouth daily. (Patient taking differently: Take 10 mg by mouth daily as needed (for allergies.). ) 90 tablet 4  . Cholecalciferol (VITAMIN D3 PO) Take 2,500 Units by mouth at bedtime. Takes when remembers    . pantoprazole (PROTONIX) 40 MG tablet TAKE 1 TABLET EVERY DAY 90 tablet 3  . PREMARIN vaginal cream PLACE 1 APPLICATORFUL VAGINALLY 2 TIMES A WEEK 30 g 1  . triamcinolone (NASACORT AQ) 55 MCG/ACT AERO nasal inhaler Place 2 sprays into the nose as needed.    . TURMERIC PO Take 200 mg by mouth at bedtime. Takes when remembers     No current facility-administered medications for this visit.     Allergies:   Penicillins and Bisphosphonates   Social History:  The patient  reports that she quit smoking about 13 years ago. Her smoking use included cigarettes. She has a 37.00 pack-year smoking history. She has never used smokeless tobacco. She reports current alcohol use. She  reports that she does not use drugs.   Family History:   family history includes Alcohol abuse in her father; Anemia in her mother; Aplastic anemia in her mother; COPD in her father; Colon cancer in her maternal aunt; Colon polyps in her sister; Diabetes in her brother, brother, and sister; Esophageal cancer in her brother; Hypertension in her father; Multiple myeloma in her brother; Throat cancer in her father.    Review of Systems: Review of Systems  Constitutional: Negative.   HENT: Negative.   Respiratory: Negative.    Cardiovascular: Negative.   Gastrointestinal: Negative.   Musculoskeletal: Negative.   Neurological: Negative.   Psychiatric/Behavioral: Negative.   All other systems reviewed and are negative.   PHYSICAL EXAM: VS:  BP 140/90 (BP Location: Left Arm, Patient Position: Sitting, Cuff Size: Normal)   Pulse 87   Ht 4' 11.5" (1.511 m)   Wt 188 lb 12 oz (85.6 kg)   SpO2 96%   BMI 37.48 kg/m  , BMI Body mass index is 37.48 kg/m. Constitutional:  oriented to person, place, and time. No distress.  Obese HENT:  Head: Grossly normal Eyes:  no discharge. No scleral icterus.  Neck: No JVD, no carotid bruits  Cardiovascular: Regular rate and rhythm, no murmurs appreciated Pulmonary/Chest: Clear to auscultation bilaterally, no wheezes or rails Abdominal: Soft.  no distension.  no tenderness.  Musculoskeletal: Normal range of motion Neurological:  normal muscle tone. Coordination normal. No atrophy Skin: Skin warm and dry Psychiatric: normal affect, pleasant   Recent Labs: 04/04/2019: ALT 36; BUN 18; Creatinine, Ser 0.56; Hemoglobin 13.7; Platelets 265; Potassium 3.9; Sodium 137; TSH 1.663    Lipid Panel Lab Results  Component Value Date   CHOL 191 08/04/2017   HDL 55.40 08/04/2017   LDLCALC 113 (H) 08/04/2017   TRIG 115.0 08/04/2017      Wt Readings from Last 3 Encounters:  04/10/19 188 lb 12 oz (85.6 kg)  04/04/19 186 lb 6.4 oz (84.6 kg)  03/01/19 180 lb (81.6 kg)       ASSESSMENT AND PLAN:  Problem List Items Addressed This Visit      Cardiology Problems   Essential hypertension   Hyperlipidemia     Other   Dyspnea on exertion    Other Visit Diagnoses    Centrilobular emphysema (HCC)    -  Primary     Shortness of breath Reports symptoms have improved with 10 pound weight loss through dietary change Feels that she is at her baseline Normal EKG, normal clinical exam Interestingly her REDS VEST score is elevated more than 40 concerning for fluid  retention -We have ordered echocardiogram to rule out pulmonary hypertension CT scan images chest reviewed with her showing minimal coronary calcification, less likely ischemia  Emphysema Reports that she quit smoking Seen on CT scan Recommended weight loss  Hypertension Numbers high end of normal on today's visit, recommend she monitor blood pressure at home and call our office if this continues to run high  Hyperlipidemia She does not want a statin Recommend she read about [facility] and call our office if she would like to start this  Disposition:   F/U  12 months   Total encounter time more than 60 minutes  Greater than 50% was spent in counseling and coordination of care with the patient    Signed, Esmond Plants, M.D., Ph.D. [place], [place]

## 2019-04-10 ENCOUNTER — Other Ambulatory Visit: Payer: Self-pay

## 2019-04-10 ENCOUNTER — Encounter: Payer: Self-pay | Admitting: Cardiovascular Disease

## 2019-04-10 ENCOUNTER — Ambulatory Visit (INDEPENDENT_AMBULATORY_CARE_PROVIDER_SITE_OTHER): Payer: Medicare HMO | Admitting: Cardiovascular Disease

## 2019-04-10 VITALS — BP 140/90 | HR 87 | Ht 59.5 in | Wt 188.8 lb

## 2019-04-10 DIAGNOSIS — I10 Essential (primary) hypertension: Secondary | ICD-10-CM

## 2019-04-10 DIAGNOSIS — J432 Centrilobular emphysema: Secondary | ICD-10-CM

## 2019-04-10 DIAGNOSIS — R0600 Dyspnea, unspecified: Secondary | ICD-10-CM | POA: Diagnosis not present

## 2019-04-10 DIAGNOSIS — E782 Mixed hyperlipidemia: Secondary | ICD-10-CM | POA: Diagnosis not present

## 2019-04-10 DIAGNOSIS — R0609 Other forms of dyspnea: Secondary | ICD-10-CM

## 2019-04-10 NOTE — Patient Instructions (Addendum)
Read about Zetia for cholesterol  Echo as below  Medication Instructions:  No changes  If you need a refill on your cardiac medications before your next appointment, please call your pharmacy.    Lab work: No new labs needed   If you have labs (blood work) drawn today and your tests are completely normal, you will receive your results only by: [name] MyChart Message (if you have MyChart) OR . A paper copy in the mail If you have any lab test that is abnormal or we need to change your treatment, we will call you to review the results.   Testing/Procedures: Echo for shortness of breath  Your physician has requested that you have an echocardiogram. Echocardiography is a painless test that uses sound waves to create images of your heart. It provides your doctor with information about the size and shape of your heart and how well your heart's chambers and valves are working. This procedure takes approximately one hour. There are no restrictions for this procedure.   Echocardiogram An echocardiogram is a procedure that uses painless sound waves (ultrasound) to produce an image of the heart. Images from an echocardiogram can provide important information about:  Signs of coronary artery disease (CAD).  Aneurysm detection. An aneurysm is a weak or damaged part of an artery wall that bulges out from the normal force of blood pumping through the body.  Heart size and shape. Changes in the size or shape of the heart can be associated with certain conditions, including heart failure, aneurysm, and CAD.  Heart muscle function.  Heart valve function.  Signs of a past heart attack.  Fluid buildup around the heart.  Thickening of the heart muscle.  A tumor or infectious growth around the heart valves. Tell a health care provider about:  Any allergies you have.  All medicines you are taking, including vitamins, herbs, eye drops, creams, and over-the-counter medicines.  Any blood  disorders you have.  Any surgeries you have had.  Any medical conditions you have.  Whether you are pregnant or may be pregnant. What are the risks? Generally, this is a safe procedure. However, problems may occur, including:  Allergic reaction to dye (contrast) that may be used during the procedure. What happens before the procedure? No specific preparation is needed. You may eat and drink normally. What happens during the procedure?   An IV tube may be inserted into one of your veins.  You may receive contrast through this tube. A contrast is an injection that improves the quality of the pictures from your heart.  A gel will be applied to your chest.  A wand-like tool (transducer) will be moved over your chest. The gel will help to transmit the sound waves from the transducer.  The sound waves will harmlessly bounce off of your heart to allow the heart images to be captured in real-time motion. The images will be recorded on a computer. The procedure may vary among health care providers and hospitals. What happens after the procedure?  You may return to your normal, everyday life, including diet, activities, and medicines, unless your health care provider tells you not to do that. Summary  An echocardiogram is a procedure that uses painless sound waves (ultrasound) to produce an image of the heart.  Images from an echocardiogram can provide important information about the size and shape of your heart, heart muscle function, heart valve function, and fluid buildup around your heart.  You do not need to do anything  to prepare before this procedure. You may eat and drink normally.  After the echocardiogram is completed, you may return to your normal, everyday life, unless your health care provider tells you not to do that. This information is not intended to replace advice given to you by your health care provider. Make sure you discuss any questions you have with your health  care provider. Document Revised: 07/14/2018 Document Reviewed: 04/25/2016 Elsevier Patient Education  [place]: At [facility], you and your health needs are our priority.  As part of our continuing mission to provide you with exceptional heart care, we have created designated Provider Care Teams.  These Care Teams include your primary Cardiologist (physician) and Advanced Practice Providers (APPs -  Physician Assistants and Nurse Practitioners) who all work together to provide you with the care you need, when you need it.  . You will need a follow up appointment as needed   . Providers on your designated Care Team:   . Murray Hodgkins, NP . Christell Faith, PA-C . Marrianne Mood, PA-C  Any Other Special Instructions Will Be Listed Below (If Applicable).  For educational health videos Log in to : [email or web address] Or : [email or web address], password : [email or web address]  Ezetimibe Tablets What is this medicine? EZETIMIBE (ez ET i mibe) blocks the absorption of cholesterol from the stomach. It can help lower blood cholesterol for patients who are at risk of getting heart disease or a stroke. It is only for patients whose cholesterol level is not controlled by diet. This medicine may be used for other purposes; ask your health care provider or pharmacist if you have questions. COMMON BRAND NAME(S): Zetia What should I tell my health care provider before I take this medicine? They need to know if you have any of these conditions:  liver disease  an unusual or allergic reaction to ezetimibe, medicines, foods, dyes, or preservatives  pregnant or trying to get pregnant  breast-feeding How should I use this medicine? Take this medicine by mouth with a glass of water. Follow the directions on the prescription label. This medicine can be taken with or without food. Take your doses at regular intervals. Do not take your medicine more often than directed. Talk to your pediatrician  regarding the use of this medicine in children. Special care may be needed. Overdosage: If you think you have taken too much of this medicine contact a poison control center or emergency room at once. NOTE: This medicine is only for you. Do not share this medicine with others. What if I miss a dose? If you miss a dose, take it as soon as you can. If it is almost time for your next dose, take only that dose. Do not take double or extra doses. What may interact with this medicine? Do not take this medicine with any of the following medications:  fenofibrate  gemfibrozil This medicine may also interact with the following medications:  antacids  cyclosporine  herbal medicines like red yeast rice  other medicines to lower cholesterol or triglycerides This list may not describe all possible interactions. Give your health care provider a list of all the medicines, herbs, non-prescription drugs, or dietary supplements you use. Also tell them if you smoke, drink alcohol, or use illegal drugs. Some items may interact with your medicine. What should I watch for while using this medicine? Visit your doctor or health care professional for regular checks on your progress. You will need to have your cholesterol  levels checked. If you are also taking some other cholesterol medicines, you will also need to have tests to make sure your liver is working properly. Tell your doctor or health care professional if you get any unexplained muscle pain, tenderness, or weakness, especially if you also have a fever and tiredness. You need to follow a low-cholesterol, low-fat diet while you are taking this medicine. This will decrease your risk of getting heart and blood vessel disease. Exercising and avoiding alcohol and smoking can also help. Ask your doctor or dietician for advice. What side effects may I notice from receiving this medicine? Side effects that you should report to your doctor or health care  professional as soon as possible:  allergic reactions like skin rash, itching or hives, swelling of the face, lips, or tongue  dark yellow or brown urine  unusually weak or tired  yellowing of the skin or eyes Side effects that usually do not require medical attention (report to your doctor or health care professional if they continue or are bothersome):  diarrhea  dizziness  headache  stomach upset or pain This list may not describe all possible side effects. Call your doctor for medical advice about side effects. You may report side effects to FDA at 1-800-FDA-1088. Where should I keep my medicine? Keep out of the reach of children. Store at room temperature between 15 and 30 degrees C (59 and 86 degrees F). Protect from moisture. Keep container tightly closed. Throw away any unused medicine after the expiration date. NOTE: This sheet is a summary. It may not cover all possible information. If you have questions about this medicine, talk to your doctor, pharmacist, or health care provider.  2020 Elsevier/Gold Standard (2011-09-28 15:39:09)

## 2019-04-11 ENCOUNTER — Telehealth: Payer: Self-pay | Admitting: Family Medicine

## 2019-04-11 NOTE — Telephone Encounter (Signed)
Pt called to get lab results °

## 2019-04-11 NOTE — Telephone Encounter (Signed)
Called and gave patient lab results.  [name],cma

## 2019-04-23 ENCOUNTER — Encounter: Payer: Self-pay | Admitting: Family Medicine

## 2019-04-25 DIAGNOSIS — N2889 Other specified disorders of kidney and ureter: Secondary | ICD-10-CM | POA: Diagnosis not present

## 2019-04-27 ENCOUNTER — Ambulatory Visit (INDEPENDENT_AMBULATORY_CARE_PROVIDER_SITE_OTHER): Payer: Medicare HMO

## 2019-04-27 ENCOUNTER — Other Ambulatory Visit: Payer: Self-pay

## 2019-04-27 DIAGNOSIS — R0609 Other forms of dyspnea: Secondary | ICD-10-CM

## 2019-04-27 DIAGNOSIS — R0600 Dyspnea, unspecified: Secondary | ICD-10-CM | POA: Diagnosis not present

## 2019-04-28 ENCOUNTER — Ambulatory Visit: Payer: Self-pay | Admitting: Urology

## 2019-04-28 ENCOUNTER — Telehealth: Payer: Self-pay | Admitting: *Deleted

## 2019-04-28 NOTE — Telephone Encounter (Signed)
Left voicemail message to call back to review results.  

## 2019-04-28 NOTE — Telephone Encounter (Signed)
Reviewed results and recommendations with patient and she did report some swelling when she is on her feet all day or on a long car ride. Encouraged her to wear compression hose to help with those activities. She verbalized understanding of our conversation and had no further questions at this time.

## 2019-04-28 NOTE — Telephone Encounter (Signed)
-----   Message from Minna Merritts, MD sent at 04/27/2019  6:55 PM EST ----- Echo  Normal cardiac function, EF 0000000 There is diastolic dysfunction which can cause some SOB, treated with medications (good blood pressure/lasix as needed) no clear pulmonary Hypertension

## 2019-04-28 NOTE — Telephone Encounter (Signed)
Left voicemail message to call back for results.  

## 2019-04-28 NOTE — Patient Instructions (Signed)
How to Take Your Blood Pressure You can take your blood pressure at home with a machine. You may need to check your blood pressure at home:  To check if you have high blood pressure (hypertension).  To check your blood pressure over time.  To make sure your blood pressure medicine is working. Supplies needed: You will need a blood pressure machine, or monitor. You can buy one at a drugstore or online. When choosing one:  Choose one with an arm cuff.  Choose one that wraps around your upper arm. Only one finger should fit between your arm and the cuff.  Do not choose one that measures your blood pressure from your wrist or finger. Your doctor can suggest a monitor. How to prepare Avoid these things for 30 minutes before checking your blood pressure:  Drinking caffeine.  Drinking alcohol.  Eating.  Smoking.  Exercising. Five minutes before checking your blood pressure:  Pee.  Sit in a dining chair. Avoid sitting in a soft couch or armchair.  Be quiet. Do not talk. How to take your blood pressure Follow the instructions that came with your machine. If you have a digital blood pressure monitor, these may be the instructions: 1. Sit up straight. 2. Place your feet on the floor. Do not cross your ankles or legs. 3. Rest your left arm at the level of your heart. You may rest it on a table, desk, or chair. 4. Pull up your shirt sleeve. 5. Wrap the blood pressure cuff around the upper part of your left arm. The cuff should be 1 inch (2.5 cm) above your elbow. It is best to wrap the cuff around bare skin. 6. Fit the cuff snugly around your arm. You should be able to place only one finger between the cuff and your arm. 7. Put the cord inside the groove of your elbow. 8. Press the power button. 9. Sit quietly while the cuff fills with air and loses air. 10. Write down the numbers on the screen. 11. Wait 2-3 minutes and then repeat steps 1-10. What do the numbers mean? Two  numbers make up your blood pressure. The first number is called systolic pressure. The second is called diastolic pressure. An example of a blood pressure reading is "120 over 80" (or 120/80). If you are an adult and do not have a medical condition, use this guide to find out if your blood pressure is normal: Normal  First number: below 120.  Second number: below 80. Elevated  First number: 120-129.  Second number: below 80. Hypertension stage 1  First number: 130-139.  Second number: 80-89. Hypertension stage 2  First number: 140 or above.  Second number: 90 or above. Your blood pressure is above normal even if only the top or bottom number is above normal. Follow these instructions at home:  Check your blood pressure as often as your doctor tells you to.  Take your monitor to your next doctor's appointment. Your doctor will: ? Make sure you are using it correctly. ? Make sure it is working right.  Make sure you understand what your blood pressure numbers should be.  Tell your doctor if your medicines are causing side effects. Contact a doctor if:  Your blood pressure keeps being high. Get help right away if:  Your first blood pressure number is higher than 180.  Your second blood pressure number is higher than 120. This information is not intended to replace advice given to you by your health   care provider. Make sure you discuss any questions you have with your health care provider. Document Revised: 03/05/2017 Document Reviewed: 08/30/2015 Elsevier Patient Education  2020 [employer].

## 2019-05-09 ENCOUNTER — Other Ambulatory Visit: Payer: Self-pay

## 2019-05-15 DIAGNOSIS — N2889 Other specified disorders of kidney and ureter: Secondary | ICD-10-CM | POA: Diagnosis not present

## 2019-05-16 ENCOUNTER — Other Ambulatory Visit: Payer: Self-pay

## 2019-05-16 ENCOUNTER — Other Ambulatory Visit (INDEPENDENT_AMBULATORY_CARE_PROVIDER_SITE_OTHER): Payer: Medicare HMO

## 2019-05-16 DIAGNOSIS — R0600 Dyspnea, unspecified: Secondary | ICD-10-CM | POA: Diagnosis not present

## 2019-05-16 DIAGNOSIS — R0609 Other forms of dyspnea: Secondary | ICD-10-CM

## 2019-05-16 LAB — COMPREHENSIVE METABOLIC PANEL
ALT: 26 U/L (ref 0–35)
AST: 20 U/L (ref 0–37)
Albumin: 4.3 g/dL (ref 3.5–5.2)
Alkaline Phosphatase: 61 U/L (ref 39–117)
BUN: 20 mg/dL (ref 6–23)
CO2: 28 mEq/L (ref 19–32)
Calcium: 9.8 mg/dL (ref 8.4–10.5)
Chloride: 101 mEq/L (ref 96–112)
Creatinine, Ser: 0.77 mg/dL (ref 0.40–1.20)
GFR: 73.65 mL/min (ref >60.00)
Glucose, Bld: 112 mg/dL — ABNORMAL HIGH (ref 70–99)
Potassium: 3.5 mEq/L (ref 3.5–5.1)
Sodium: 136 mEq/L (ref 135–145)
Total Bilirubin: 0.5 mg/dL (ref 0.2–1.2)
Total Protein: 7.5 g/dL (ref 6.0–8.3)

## 2019-05-16 NOTE — Addendum Note (Signed)
Addended by: Leeanne Rio on: 05/16/2019 09:36 AM   Modules accepted: Orders

## 2019-05-18 ENCOUNTER — Other Ambulatory Visit: Payer: Self-pay | Admitting: Family Medicine

## 2019-05-18 DIAGNOSIS — E538 Deficiency of other specified B group vitamins: Secondary | ICD-10-CM

## 2019-05-30 DIAGNOSIS — E785 Hyperlipidemia, unspecified: Secondary | ICD-10-CM | POA: Diagnosis not present

## 2019-05-30 DIAGNOSIS — N2889 Other specified disorders of kidney and ureter: Secondary | ICD-10-CM | POA: Diagnosis not present

## 2019-05-30 DIAGNOSIS — Z01818 Encounter for other preprocedural examination: Secondary | ICD-10-CM | POA: Diagnosis not present

## 2019-05-30 DIAGNOSIS — I10 Essential (primary) hypertension: Secondary | ICD-10-CM | POA: Diagnosis not present

## 2019-05-30 DIAGNOSIS — E669 Obesity, unspecified: Secondary | ICD-10-CM | POA: Diagnosis not present

## 2019-05-30 DIAGNOSIS — K219 Gastro-esophageal reflux disease without esophagitis: Secondary | ICD-10-CM | POA: Diagnosis not present

## 2019-06-04 ENCOUNTER — Ambulatory Visit: Payer: Medicare HMO | Attending: Internal Medicine

## 2019-06-04 ENCOUNTER — Other Ambulatory Visit: Payer: Self-pay | Admitting: Critical Care Medicine

## 2019-06-04 DIAGNOSIS — Z23 Encounter for immunization: Secondary | ICD-10-CM | POA: Insufficient documentation

## 2019-06-04 NOTE — Progress Notes (Signed)
   Covid-19 Vaccination Clinic  Name:  Amber Perez    MRN: [id number] DOB: 08-25-1945  06/04/2019  [name] was observed post Covid-19 immunization for 15 minutes without incidence. She was provided with Vaccine Information Sheet and instruction to access the V-Safe system.   [name] was instructed to call 911 with any severe reactions post vaccine: [name] Difficulty breathing  . Swelling of your face and throat  . A fast heartbeat  . A bad rash all over your body  . Dizziness and weakness    Immunizations Administered    Name Date Dose VIS Date Route   Pfizer COVID-19 Vaccine 06/04/2019 11:28 AM 0.3 mL 03/17/2019 Intramuscular   Manufacturer: [place]   Lot: [id number]   [place]: [id number]

## 2019-06-06 ENCOUNTER — Encounter: Payer: Self-pay | Admitting: Family Medicine

## 2019-06-06 ENCOUNTER — Telehealth (INDEPENDENT_AMBULATORY_CARE_PROVIDER_SITE_OTHER): Payer: Medicare HMO | Admitting: Family Medicine

## 2019-06-06 ENCOUNTER — Other Ambulatory Visit: Payer: Self-pay

## 2019-06-06 DIAGNOSIS — J0140 Acute pansinusitis, unspecified: Secondary | ICD-10-CM | POA: Diagnosis not present

## 2019-06-06 DIAGNOSIS — J329 Chronic sinusitis, unspecified: Secondary | ICD-10-CM | POA: Insufficient documentation

## 2019-06-06 MED ORDER — DOXYCYCLINE HYCLATE 100 MG PO TABS: 100.0000 mg | ORAL_TABLET | Freq: Two times a day (BID) | ORAL | 0 refills | Status: DC

## 2019-06-06 NOTE — Progress Notes (Signed)
Virtual Visit via video Note  This visit type was conducted due to national recommendations for restrictions regarding the COVID-19 pandemic (e.g. social distancing).  This format is felt to be most appropriate for this patient at this time.  All issues noted in this document were discussed and addressed.  No physical exam was performed (except for noted visual exam findings with Video Visits).   I connected with Amber Perez today at 11:00 AM EST by a video enabled telemedicine application and verified that I am speaking with the correct person using two identifiers. Location patient: home Location provider: work Persons participating in the virtual visit: patient, provider  I discussed the limitations, risks, security and privacy concerns of performing an evaluation and management service by telephone and the availability of in person appointments. I also discussed with the patient that there may be a patient responsible charge related to this service. The patient expressed understanding and agreed to proceed.  Reason for visit: same day  HPI: Sinusitis: Patient notes this has been going on at least a month if not 6 weeks.  She has congestion in her face and ear pain.  Her teeth hurt.  She is blowing bloody mucus out.  She has taken several courses of over-the-counter Mucinex, nasal spray, and Sudafed and that stopped the symptoms for couple of days though the symptoms come back after she stopped those medications.  Some postnasal drip.  No fever cough, shortness of breath, loss of taste or smell, or COVID-19 exposure.   ROS: See pertinent positives and negatives per HPI.  Past Medical History:  Diagnosis Date  . Closed left arm fracture    [facility]   . COPD (chronic obstructive pulmonary disease) ([place])   . Dyslipidemia   . GERD (gastroesophageal reflux disease)   . Hypertension 2008  . Osteoarthritis   . Osteoporosis   . Pneumonia 2008  . Pre-diabetes   . Thyroid  nodule   . Vitamin D deficiency     Past Surgical History:  Procedure Laterality Date  . ABDOMINAL HYSTERECTOMY  1995  . BILATERAL SALPINGOOPHORECTOMY  1995  . BREAST BIOPSY Left    neg  . BREAST SURGERY     Benign per pt's report  . CATARACT EXTRACTION  2009  . CESAREAN SECTION  [id number]  . COLONOSCOPY  2007  . GANGLION CYST EXCISION    . KNEE ARTHROPLASTY Right 06/29/2016   Procedure: COMPUTER ASSISTED TOTAL KNEE ARTHROPLASTY;  Surgeon: [name], MD;  Location: [facility] ORS;  Service: Orthopedics;  Laterality: Right;  . KNEE ARTHROSCOPY W/ MENISCAL REPAIR Right 04/2012   [name]  . UTERINE FIBROID SURGERY     x 5    Family History  Problem Relation Age of Onset  . Anemia Mother        aplastic  . Aplastic anemia Mother   . Throat cancer Father   . Hypertension Father   . Alcohol abuse Father   . COPD Father   . Diabetes Sister   . Esophageal cancer Brother   . Colon polyps Sister   . Diabetes Brother   . Multiple myeloma Brother   . Diabetes Brother   . Colon cancer Maternal Aunt   . Breast cancer Neg Hx     SOCIAL HX: Former smoker   Current Outpatient Medications:  .  amLODipine (NORVASC) 5 MG tablet, TAKE 1 TABLET EVERY DAY, Disp: 90 tablet, Rfl: 3 .  Calcium-Magnesium (CAL-MAG PO), Take 2 tablets by mouth at  bedtime. Takes occasionally, Disp: , Rfl:  .  cetirizine (ZYRTEC) 10 MG tablet, Take 1 tablet (10 mg total) by mouth daily. (Patient taking differently: Take 10 mg by mouth daily as needed (for allergies.). ), Disp: 90 tablet, Rfl: 4 .  Cholecalciferol (VITAMIN D3 PO), Take 2,500 Units by mouth at bedtime. Takes when remembers, Disp: , Rfl:  .  pantoprazole (PROTONIX) 40 MG tablet, TAKE 1 TABLET EVERY DAY, Disp: 90 tablet, Rfl: 3 .  PREMARIN vaginal cream, PLACE 1 APPLICATORFUL VAGINALLY 2 TIMES A WEEK, Disp: 30 g, Rfl: 1 .  triamcinolone (NASACORT AQ) 55 MCG/ACT AERO nasal inhaler, Place 2 sprays into the nose as needed., Disp: , Rfl:  .  TURMERIC  PO, Take 200 mg by mouth at bedtime. Takes when remembers, Disp: , Rfl:  .  doxycycline (VIBRA-TABS) 100 MG tablet, Take 1 tablet (100 mg total) by mouth 2 (two) times daily., Disp: 14 tablet, Rfl: 0  EXAM:  VITALS per patient if applicable:  GENERAL: alert, oriented, appears well and in no acute distress  HEENT: atraumatic, conjunttiva clear, no obvious abnormalities on inspection of external nose and ears  NECK: normal movements of the head and neck  LUNGS: on inspection no signs of respiratory distress, breathing rate appears normal, no obvious gross SOB, gasping or wheezing  CV: no obvious cyanosis  MS: moves all visible extremities without noticeable abnormality  PSYCH/NEURO: pleasant and cooperative, no obvious depression or anxiety, speech and thought processing grossly intact  ASSESSMENT AND PLAN:  Discussed the following assessment and plan:  Sinusitis Patient with sinusitis.  This does not seem consistent with COVID-19 given the duration.  We will treat with doxycycline.  She does have a surgery next week.  I encouraged her to contact her surgeon's office to let them know her symptoms and let them know that we are treating her for a sinus infection.  She does have a COVID-19 screening test scheduled for Saturday.  Advised if she is not noticing a difference with the doxycycline by Friday she should contact us.   No orders of the defined types were placed in this encounter.   Meds ordered this encounter  Medications  . doxycycline (VIBRA-TABS) 100 MG tablet    Sig: Take 1 tablet (100 mg total) by mouth 2 (two) times daily.    Dispense:  14 tablet    Refill:  0     I discussed the assessment and treatment plan with the patient. The patient was provided an opportunity to ask questions and all were answered. The patient agreed with the plan and demonstrated an understanding of the instructions.   The patient was advised to call back or seek an in-person evaluation if  the symptoms worsen or if the condition fails to improve as anticipated.   Eric Sonnenberg, MD   

## 2019-06-06 NOTE — Assessment & Plan Note (Signed)
Patient with sinusitis.  This does not seem consistent with COVID-19 given the duration.  We will treat with doxycycline.  She does have a surgery next week.  I encouraged her to contact her surgeon's office to let them know her symptoms and let them know that we are treating her for a sinus infection.  She does have a COVID-19 screening test scheduled for Saturday.  Advised if she is not noticing a difference with the doxycycline by Friday she should contact us.

## 2019-06-09 ENCOUNTER — Encounter: Payer: Self-pay | Admitting: Family Medicine

## 2019-06-09 DIAGNOSIS — K219 Gastro-esophageal reflux disease without esophagitis: Secondary | ICD-10-CM

## 2019-06-09 DIAGNOSIS — I10 Essential (primary) hypertension: Secondary | ICD-10-CM

## 2019-06-10 DIAGNOSIS — N2889 Other specified disorders of kidney and ureter: Secondary | ICD-10-CM | POA: Diagnosis not present

## 2019-06-10 DIAGNOSIS — Z20822 Contact with and (suspected) exposure to covid-19: Secondary | ICD-10-CM | POA: Diagnosis not present

## 2019-06-11 ENCOUNTER — Other Ambulatory Visit: Payer: Self-pay | Admitting: Internal Medicine

## 2019-06-11 DIAGNOSIS — K219 Gastro-esophageal reflux disease without esophagitis: Secondary | ICD-10-CM

## 2019-06-12 DIAGNOSIS — Z6838 Body mass index (BMI) 38.0-38.9, adult: Secondary | ICD-10-CM | POA: Diagnosis not present

## 2019-06-12 DIAGNOSIS — K219 Gastro-esophageal reflux disease without esophagitis: Secondary | ICD-10-CM | POA: Diagnosis not present

## 2019-06-12 DIAGNOSIS — I10 Essential (primary) hypertension: Secondary | ICD-10-CM | POA: Diagnosis not present

## 2019-06-12 DIAGNOSIS — M810 Age-related osteoporosis without current pathological fracture: Secondary | ICD-10-CM | POA: Diagnosis not present

## 2019-06-12 DIAGNOSIS — E785 Hyperlipidemia, unspecified: Secondary | ICD-10-CM | POA: Diagnosis not present

## 2019-06-12 DIAGNOSIS — Z87891 Personal history of nicotine dependence: Secondary | ICD-10-CM | POA: Diagnosis not present

## 2019-06-12 DIAGNOSIS — N2889 Other specified disorders of kidney and ureter: Secondary | ICD-10-CM | POA: Diagnosis not present

## 2019-06-12 DIAGNOSIS — Z791 Long term (current) use of non-steroidal anti-inflammatories (NSAID): Secondary | ICD-10-CM | POA: Diagnosis not present

## 2019-06-12 DIAGNOSIS — C642 Malignant neoplasm of left kidney, except renal pelvis: Secondary | ICD-10-CM | POA: Diagnosis not present

## 2019-06-12 DIAGNOSIS — E668 Other obesity: Secondary | ICD-10-CM | POA: Diagnosis not present

## 2019-06-12 HISTORY — PX: ROBOTIC ASSITED PARTIAL NEPHRECTOMY: SHX6087

## 2019-06-12 MED ORDER — AMLODIPINE BESYLATE 5 MG PO TABS: ORAL_TABLET | ORAL | 0 refills | Status: DC

## 2019-06-12 MED ORDER — PANTOPRAZOLE SODIUM 40 MG PO TBEC: 40.0000 mg | DELAYED_RELEASE_TABLET | Freq: Every day | ORAL | 3 refills | Status: DC

## 2019-06-13 DIAGNOSIS — I10 Essential (primary) hypertension: Secondary | ICD-10-CM | POA: Diagnosis not present

## 2019-06-13 DIAGNOSIS — Z87891 Personal history of nicotine dependence: Secondary | ICD-10-CM | POA: Diagnosis not present

## 2019-06-13 DIAGNOSIS — E668 Other obesity: Secondary | ICD-10-CM | POA: Diagnosis not present

## 2019-06-13 DIAGNOSIS — K219 Gastro-esophageal reflux disease without esophagitis: Secondary | ICD-10-CM | POA: Diagnosis not present

## 2019-06-13 DIAGNOSIS — Z791 Long term (current) use of non-steroidal anti-inflammatories (NSAID): Secondary | ICD-10-CM | POA: Diagnosis not present

## 2019-06-13 DIAGNOSIS — E785 Hyperlipidemia, unspecified: Secondary | ICD-10-CM | POA: Diagnosis not present

## 2019-06-13 DIAGNOSIS — C642 Malignant neoplasm of left kidney, except renal pelvis: Secondary | ICD-10-CM | POA: Diagnosis not present

## 2019-06-13 DIAGNOSIS — Z6838 Body mass index (BMI) 38.0-38.9, adult: Secondary | ICD-10-CM | POA: Diagnosis not present

## 2019-06-13 DIAGNOSIS — M810 Age-related osteoporosis without current pathological fracture: Secondary | ICD-10-CM | POA: Diagnosis not present

## 2019-06-21 ENCOUNTER — Other Ambulatory Visit: Payer: Self-pay

## 2019-06-21 DIAGNOSIS — I10 Essential (primary) hypertension: Secondary | ICD-10-CM

## 2019-06-21 MED ORDER — AMLODIPINE BESYLATE 5 MG PO TABS: ORAL_TABLET | ORAL | 1 refills | Status: DC

## 2019-06-27 ENCOUNTER — Ambulatory Visit: Payer: Medicare HMO | Attending: Internal Medicine

## 2019-06-27 DIAGNOSIS — Z23 Encounter for immunization: Secondary | ICD-10-CM

## 2019-06-27 NOTE — Progress Notes (Signed)
   Covid-19 Vaccination Clinic  Name:  Amber Perez    MRN: [id number] DOB: Apr 27, 1945  06/27/2019  [name] was observed post Covid-19 immunization for 15 minutes without incident. She was provided with Vaccine Information Sheet and instruction to access the V-Safe system.   [name] was instructed to call 911 with any severe reactions post vaccine: [name] Difficulty breathing  . Swelling of face and throat  . A fast heartbeat  . A bad rash all over body  . Dizziness and weakness   Immunizations Administered    Name Date Dose VIS Date Route   Pfizer COVID-19 Vaccine 06/27/2019 10:03 AM 0.3 mL 03/17/2019 Intramuscular   Manufacturer: [employer]   Lot: [id number]   [place]: [id number]

## 2019-07-04 ENCOUNTER — Telehealth: Payer: Self-pay | Admitting: Family Medicine

## 2019-07-04 ENCOUNTER — Other Ambulatory Visit: Payer: Self-pay | Admitting: Family Medicine

## 2019-07-04 DIAGNOSIS — I10 Essential (primary) hypertension: Secondary | ICD-10-CM

## 2019-07-04 NOTE — Telephone Encounter (Signed)
Please advise which to take.

## 2019-07-04 NOTE — Telephone Encounter (Signed)
Pt states that she needs a refill on pantoprazole (PROTONIX) 40 MG tablet. She states that she used to take 20mg  and now it is 40mg  and is not sure which one she is supposed to be taking but she would like a 90 day supply.

## 2019-07-05 NOTE — Telephone Encounter (Signed)
Patient has been notified

## 2019-07-05 NOTE — Telephone Encounter (Signed)
It looks like she has been on protonix 40 mg for several years. She had a 90 day supply sent to her mail order pharmacy. If she wants it sent elsewhere it is ok to send in a 90 day supply with one refill.

## 2019-07-06 DIAGNOSIS — N2889 Other specified disorders of kidney and ureter: Secondary | ICD-10-CM | POA: Diagnosis not present

## 2019-07-06 DIAGNOSIS — C642 Malignant neoplasm of left kidney, except renal pelvis: Secondary | ICD-10-CM | POA: Diagnosis not present

## 2019-07-18 DIAGNOSIS — Z1211 Encounter for screening for malignant neoplasm of colon: Secondary | ICD-10-CM | POA: Diagnosis not present

## 2019-07-18 DIAGNOSIS — K5732 Diverticulitis of large intestine without perforation or abscess without bleeding: Secondary | ICD-10-CM | POA: Diagnosis not present

## 2019-07-19 ENCOUNTER — Other Ambulatory Visit: Payer: Self-pay | Admitting: General Surgery

## 2019-07-27 DIAGNOSIS — N2889 Other specified disorders of kidney and ureter: Secondary | ICD-10-CM | POA: Diagnosis not present

## 2019-07-27 DIAGNOSIS — C642 Malignant neoplasm of left kidney, except renal pelvis: Secondary | ICD-10-CM | POA: Diagnosis not present

## 2019-08-07 ENCOUNTER — Other Ambulatory Visit: Payer: Medicare HMO

## 2019-08-16 ENCOUNTER — Other Ambulatory Visit: Payer: Medicare HMO

## 2019-08-16 ENCOUNTER — Encounter: Payer: Self-pay | Admitting: Family Medicine

## 2019-08-16 ENCOUNTER — Other Ambulatory Visit
Admission: RE | Admit: 2019-08-16 | Discharge: 2019-08-16 | Disposition: A | Discharge status: Home / Self Care | Payer: Medicare HMO | Source: Ambulatory Visit | Attending: General Surgery | Admitting: General Surgery

## 2019-08-16 ENCOUNTER — Other Ambulatory Visit: Payer: Self-pay

## 2019-08-16 ENCOUNTER — Ambulatory Visit (INDEPENDENT_AMBULATORY_CARE_PROVIDER_SITE_OTHER): Payer: Medicare HMO | Admitting: Family Medicine

## 2019-08-16 VITALS — BP 130/80 | HR 93 | Temp 97.6°F | Ht 59.5 in | Wt 188.8 lb

## 2019-08-16 DIAGNOSIS — G2581 Restless legs syndrome: Secondary | ICD-10-CM | POA: Diagnosis not present

## 2019-08-16 DIAGNOSIS — Z01812 Encounter for preprocedural laboratory examination: Secondary | ICD-10-CM | POA: Insufficient documentation

## 2019-08-16 DIAGNOSIS — E559 Vitamin D deficiency, unspecified: Secondary | ICD-10-CM | POA: Diagnosis not present

## 2019-08-16 DIAGNOSIS — C642 Malignant neoplasm of left kidney, except renal pelvis: Secondary | ICD-10-CM | POA: Diagnosis not present

## 2019-08-16 DIAGNOSIS — I10 Essential (primary) hypertension: Secondary | ICD-10-CM

## 2019-08-16 DIAGNOSIS — E538 Deficiency of other specified B group vitamins: Secondary | ICD-10-CM | POA: Diagnosis not present

## 2019-08-16 DIAGNOSIS — Z20822 Contact with and (suspected) exposure to covid-19: Secondary | ICD-10-CM | POA: Insufficient documentation

## 2019-08-16 LAB — VITAMIN D 25 HYDROXY (VIT D DEFICIENCY, FRACTURES): VITD: 27.43 ng/mL — ABNORMAL LOW (ref 30.00–100.00)

## 2019-08-16 LAB — SARS CORONAVIRUS 2 (TAT 6-24 HRS): SARS Coronavirus 2: NEGATIVE

## 2019-08-16 LAB — IBC + FERRITIN
Ferritin: 86 ng/mL (ref 10.0–291.0)
Iron: 68 ug/dL (ref 42–145)
Saturation Ratios: 17.7 % — ABNORMAL LOW (ref 20.0–50.0)
Transferrin: 275 mg/dL (ref 212.0–360.0)

## 2019-08-16 LAB — VITAMIN B12: Vitamin B-12: 857 pg/mL (ref 211–911)

## 2019-08-16 NOTE — Assessment & Plan Note (Signed)
Status post resection.  She will continue to see oncology.

## 2019-08-16 NOTE — Assessment & Plan Note (Signed)
Check vitamin D. 

## 2019-08-16 NOTE — Assessment & Plan Note (Signed)
Continue amlodipine.  BP well controlled.

## 2019-08-16 NOTE — Patient Instructions (Signed)
Nice to see you. We will check labs today and contact you with results.  These will determine the next step in management.

## 2019-08-16 NOTE — Assessment & Plan Note (Signed)
Check iron studies.  Discussed the potential for iron supplementation.

## 2019-08-16 NOTE — Assessment & Plan Note (Signed)
Check B12.  Continue over-the-counter supplement.

## 2019-08-16 NOTE — Progress Notes (Signed)
[name], MD Phone: [phone]  Amber Perez is a 74 y.o. female who presents today for f/u.  Restless leg syndrome: Patient notes she feels the need to move her legs at night when she lays down.  Also notes some discomfort in her legs.  These things resolved when she is up moving around.  B12 deficiency: Borderline low previously.  She is been taking an oral supplement daily.  She does note some tingling in the tips of her left fingers.  This has been going on off and on for over a year.  Typically occurs when she is gripping something or when she has her hand behind her head.  No numbness or weakness.  No neck pain.  Vitamin D deficiency: She has been on a vitamin D supplement.  Previously diagnosed with this.  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  She notes this went well.  No blood in her urine.  She continues to follow with oncology.  Hypertension: Continues on amlodipine.  Blood pressure well controlled today.  She has been taking cinnamon with her coffee and thinks that has helped with her blood pressure.  No chest pain, shortness of breath.  Social History   Tobacco Use  Smoking Status Former Smoker  . Packs/day: 2.00  . Years: 18.50  . Pack years: 37.00  . Types: Cigarettes  . Quit date: 03/18/2006  . Years since quitting: 13.4  Smokeless Tobacco Never Used     ROS see history of present illness  Objective  Physical Exam Vitals:   08/16/19 1439  BP: 130/80  Pulse: 93  Temp: 97.6 F (36.4 C)  SpO2: 98%    BP Readings from Last 3 Encounters:  08/16/19 130/80  04/10/19 140/90  04/04/19 (!) 150/90   Wt Readings from Last 3 Encounters:  08/16/19 188 lb 12.8 oz (85.6 kg)  06/06/19 186 lb (84.4 kg)  04/10/19 188 lb 12 oz (85.6 kg)    Physical Exam Constitutional:      General: She is not in acute distress.    Appearance: She is not diaphoretic.  Cardiovascular:     Rate and Rhythm: Normal rate and regular  rhythm.     Heart sounds: Normal heart sounds.  Pulmonary:     Effort: Pulmonary effort is normal.     Breath sounds: Normal breath sounds.  Musculoskeletal:     Right lower leg: No edema.     Left lower leg: No edema.  Skin:    General: Skin is warm and dry.  Neurological:     Mental Status: She is alert.     Comments: 5/5 strength in bilateral biceps, triceps, grip, quads, hamstrings, plantar and dorsiflexion, sensation to light touch intact in bilateral UE and LE, normal gait      Assessment/Plan: Please see individual problem list.  Essential hypertension Continue amlodipine.  BP well controlled.  Vitamin D deficiency Check vitamin D.  Low vitamin B12 level Check B12.  Continue over-the-counter supplement.  Cancer of kidney [facility]) Status post resection.  She will continue to see oncology.  RLS (restless legs syndrome) Check iron studies.  Discussed the potential for iron supplementation.   Orders Placed This Encounter  Procedures  . Vitamin D (25 hydroxy)  . IBC + Ferritin    No orders of the defined types were placed in this encounter.   This visit occurred during the SARS-CoV-2 public health emergency.  Safety protocols were in place, including screening questions prior to  the visit, additional usage of staff PPE, and extensive cleaning of exam room while observing appropriate contact time as indicated for disinfecting solutions.    [name], MD [place]

## 2019-08-17 ENCOUNTER — Other Ambulatory Visit: Payer: Self-pay | Admitting: Family Medicine

## 2019-08-17 ENCOUNTER — Encounter: Payer: Self-pay | Admitting: General Surgery

## 2019-08-17 MED ORDER — PREGABALIN 50 MG PO CAPS: 50.0000 mg | ORAL_CAPSULE | Freq: Every evening | ORAL | 1 refills | Status: DC

## 2019-08-18 ENCOUNTER — Ambulatory Visit: Payer: Medicare HMO | Admitting: Anesthesiology

## 2019-08-18 ENCOUNTER — Encounter: Payer: Self-pay | Admitting: *Deleted

## 2019-08-18 ENCOUNTER — Other Ambulatory Visit: Payer: Self-pay

## 2019-08-18 ENCOUNTER — Encounter
Admission: RE | Disposition: A | Discharge status: Home / Self Care | Payer: Self-pay | Source: Home / Self Care | Attending: General Surgery

## 2019-08-18 ENCOUNTER — Ambulatory Visit
Admission: RE | Admit: 2019-08-18 | Discharge: 2019-08-18 | Disposition: A | Discharge status: Home / Self Care | Payer: Medicare HMO | Attending: General Surgery | Admitting: General Surgery

## 2019-08-18 DIAGNOSIS — Z79899 Other long term (current) drug therapy: Secondary | ICD-10-CM | POA: Diagnosis not present

## 2019-08-18 DIAGNOSIS — R109 Unspecified abdominal pain: Secondary | ICD-10-CM | POA: Diagnosis not present

## 2019-08-18 DIAGNOSIS — K219 Gastro-esophageal reflux disease without esophagitis: Secondary | ICD-10-CM | POA: Insufficient documentation

## 2019-08-18 DIAGNOSIS — J449 Chronic obstructive pulmonary disease, unspecified: Secondary | ICD-10-CM | POA: Insufficient documentation

## 2019-08-18 DIAGNOSIS — Z905 Acquired absence of kidney: Secondary | ICD-10-CM | POA: Insufficient documentation

## 2019-08-18 DIAGNOSIS — E785 Hyperlipidemia, unspecified: Secondary | ICD-10-CM | POA: Diagnosis not present

## 2019-08-18 DIAGNOSIS — Z85528 Personal history of other malignant neoplasm of kidney: Secondary | ICD-10-CM | POA: Insufficient documentation

## 2019-08-18 DIAGNOSIS — K5730 Diverticulosis of large intestine without perforation or abscess without bleeding: Secondary | ICD-10-CM | POA: Diagnosis not present

## 2019-08-18 DIAGNOSIS — Z1211 Encounter for screening for malignant neoplasm of colon: Secondary | ICD-10-CM | POA: Diagnosis not present

## 2019-08-18 DIAGNOSIS — Z87891 Personal history of nicotine dependence: Secondary | ICD-10-CM | POA: Insufficient documentation

## 2019-08-18 DIAGNOSIS — R7303 Prediabetes: Secondary | ICD-10-CM | POA: Diagnosis not present

## 2019-08-18 DIAGNOSIS — Z6830 Body mass index (BMI) 30.0-30.9, adult: Secondary | ICD-10-CM | POA: Diagnosis not present

## 2019-08-18 DIAGNOSIS — M810 Age-related osteoporosis without current pathological fracture: Secondary | ICD-10-CM | POA: Insufficient documentation

## 2019-08-18 DIAGNOSIS — M1990 Unspecified osteoarthritis, unspecified site: Secondary | ICD-10-CM | POA: Diagnosis not present

## 2019-08-18 DIAGNOSIS — Z96651 Presence of right artificial knee joint: Secondary | ICD-10-CM | POA: Diagnosis not present

## 2019-08-18 DIAGNOSIS — I10 Essential (primary) hypertension: Secondary | ICD-10-CM | POA: Insufficient documentation

## 2019-08-18 DIAGNOSIS — E669 Obesity, unspecified: Secondary | ICD-10-CM | POA: Diagnosis not present

## 2019-08-18 DIAGNOSIS — Z86010 Personal history of colonic polyps: Secondary | ICD-10-CM | POA: Diagnosis not present

## 2019-08-18 DIAGNOSIS — K5790 Diverticulosis of intestine, part unspecified, without perforation or abscess without bleeding: Secondary | ICD-10-CM | POA: Diagnosis not present

## 2019-08-18 HISTORY — DX: Malignant neoplasm of left kidney, except renal pelvis: C64.2

## 2019-08-18 HISTORY — PX: COLONOSCOPY WITH PROPOFOL: SHX5780

## 2019-08-18 SURGERY — COLONOSCOPY WITH PROPOFOL
Anesthesia: General

## 2019-08-18 MED ORDER — HEPARIN SOD (PORK) LOCK FLUSH 100 UNIT/ML IV SOLN
INTRAVENOUS | Status: AC
  Filled 2019-08-18: qty 5

## 2019-08-18 MED ORDER — PROPOFOL 10 MG/ML IV BOLUS
INTRAVENOUS | Status: DC | PRN
  Administered 2019-08-18: 80 mg via INTRAVENOUS

## 2019-08-18 MED ORDER — PROPOFOL 500 MG/50ML IV EMUL
INTRAVENOUS | Status: DC | PRN
  Administered 2019-08-18: 120 ug/kg/min via INTRAVENOUS

## 2019-08-18 MED ORDER — LIDOCAINE HCL (CARDIAC) PF 100 MG/5ML IV SOSY
PREFILLED_SYRINGE | INTRAVENOUS | Status: DC | PRN
  Administered 2019-08-18: 100 mg via INTRAVENOUS

## 2019-08-18 MED ORDER — PROPOFOL 10 MG/ML IV BOLUS
INTRAVENOUS | Status: AC
  Filled 2019-08-18: qty 20

## 2019-08-18 MED ORDER — SODIUM CHLORIDE 0.9 % IV SOLN
INTRAVENOUS | Status: DC
  Administered 2019-08-18: 1000 mL via INTRAVENOUS

## 2019-08-18 NOTE — Anesthesia Preprocedure Evaluation (Addendum)
Anesthesia Evaluation  Patient identified by MRN, date of birth, ID band Patient awake    Reviewed: Allergy & Precautions, H&P , NPO status , reviewed documented beta blocker date and time   Airway Mallampati: III  TM Distance: <3 FB Neck ROM: full    Dental  (+) Teeth Intact   Pulmonary pneumonia, COPD, former smoker,    Pulmonary exam normal        Cardiovascular hypertension, Normal cardiovascular exam     Neuro/Psych    GI/Hepatic GERD  Medicated and Controlled,  Endo/Other    Renal/GU Renal disease     Musculoskeletal  (+) Arthritis ,   Abdominal   Peds  Hematology   Anesthesia Other Findings Past Medical History: No date: Cancer of left kidney (HCC) No date: Closed left arm fracture     Comment:  [facility]  No date: COPD (chronic obstructive pulmonary disease) (HCC) No date: Dyslipidemia No date: GERD (gastroesophageal reflux disease) 2008: Hypertension No date: Osteoarthritis No date: Osteoporosis 2008: Pneumonia No date: Pre-diabetes No date: Thyroid nodule No date: Vitamin D deficiency  Past Surgical History: 1995: ABDOMINAL HYSTERECTOMY 1995: BILATERAL SALPINGOOPHORECTOMY No date: BREAST BIOPSY; Left     Comment:  neg No date: BREAST SURGERY     Comment:  Benign per pt's report 2009: CATARACT EXTRACTION No date: CATARACT EXTRACTION [id number]: CESAREAN SECTION 2007: COLONOSCOPY No date: GANGLION CYST EXCISION 06/29/2016: KNEE ARTHROPLASTY; Right     Comment:  Procedure: COMPUTER ASSISTED TOTAL KNEE ARTHROPLASTY;                Surgeon: [name], MD;  Location: [facility] ORS;                Service: Orthopedics;  Laterality: Right; 04/2012: KNEE ARTHROSCOPY W/ MENISCAL REPAIR; Right     Comment:  [name] No date: PARTIAL NEPHRECTOMY; Left No date: UTERINE FIBROID SURGERY     Comment:  x 5  BMI    Body Mass Index: 35.35 kg/m      Reproductive/Obstetrics                             Anesthesia Physical Anesthesia Plan  ASA: III  Anesthesia Plan: General   Post-op Pain Management:    Induction: Intravenous  PONV Risk Score and Plan: Treatment may vary due to age or medical condition and TIVA  Airway Management Planned: Nasal Cannula and Natural Airway  Additional Equipment:   Intra-op Plan:   Post-operative Plan:   Informed Consent: I have reviewed the patients History and Physical, chart, labs and discussed the procedure including the risks, benefits and alternatives for the proposed anesthesia with the patient or authorized representative who has indicated his/her understanding and acceptance.     Dental Advisory Given  Plan Discussed with: CRNA  Anesthesia Plan Comments:        Anesthesia Quick Evaluation

## 2019-08-18 NOTE — Anesthesia Postprocedure Evaluation (Signed)
Anesthesia Post Note  Patient: Amber Perez  Procedure(s) Performed: COLONOSCOPY WITH PROPOFOL (N/A )  Patient location during evaluation: Endoscopy Anesthesia Type: General Level of consciousness: awake and alert Pain management: pain level controlled Vital Signs Assessment: post-procedure vital signs reviewed and stable Respiratory status: spontaneous breathing, nonlabored ventilation and respiratory function stable Cardiovascular status: blood pressure returned to baseline and stable Postop Assessment: no apparent nausea or vomiting Anesthetic complications: no     Last Vitals:  Vitals:   08/18/19 0802 08/18/19 0812  BP: 113/74 118/73  Pulse: 82   Resp: 13   Temp: 36.7 C   SpO2: 97%     Last Pain:  Vitals:   08/18/19 0822  TempSrc:   PainSc: 0-No pain                 Alphonsus Sias

## 2019-08-18 NOTE — Op Note (Addendum)
[facility] Patient Name: Amber Perez Procedure Date: 08/18/2019 7:29 AM MRN: [id number] Account #: [id number] Date of Birth: 1945-07-11 Admit Type: Outpatient Age: 74 Room: [facility] ENDO ROOM 1 Gender: Female Note Status: Finalized Procedure:             Colonoscopy Indications:           Screening for colorectal malignant neoplasm Providers:             Robert Bellow, MD Medicines:             Monitored Anesthesia Care Complications:         No immediate complications. Procedure:             Pre-Anesthesia Assessment:                        - Prior to the procedure, a History and Physical was                         performed, and patient medications, allergies and                         sensitivities were reviewed. The patient's tolerance                         of previous anesthesia was reviewed.                        - The risks and benefits of the procedure and the                         sedation options and risks were discussed with the                         patient. All questions were answered and informed                         consent was obtained.                        After obtaining informed consent, the colonoscope was                         passed under direct vision. Throughout the procedure,                         the patient's blood pressure, pulse, and oxygen                         saturations were monitored continuously. The                         Colonoscope was introduced through the anus and                         advanced to the the terminal ileum. The colonoscopy                         was performed without difficulty. The patient  tolerated the procedure well. The quality of the bowel                         preparation was excellent. Findings:      A few small-mouthed diverticula were found in the sigmoid colon.      The retroflexed view of the distal rectum and anal verge was  normal and       showed no anal or rectal abnormalities. Impression:            - Diverticulosis in the sigmoid colon.                        - The distal rectum and anal verge are normal on                         retroflexion view.                        - No specimens collected. Recommendation:        - Repeat colonoscopy in 5 years for surveillance. Procedure Code(s):     --- Professional ---                        [phone], Colonoscopy, flexible; diagnostic, including                         collection of specimen(s) by brushing or washing, when                         performed (separate procedure) Diagnosis Code(s):     --- Professional ---                        Z12.11, Encounter for screening for malignant neoplasm                         of colon                        K57.30, Diverticulosis of large intestine without                         perforation or abscess without bleeding CPT copyright 2019 American Medical Association. All rights reserved. The codes documented in this report are preliminary and upon coder review may  be revised to meet current compliance requirements. Robert Bellow, MD 08/18/2019 7:58:10 AM This report has been signed electronically. Number of Addenda: 0 Note Initiated On: 08/18/2019 7:29 AM Scope Withdrawal Time: 0 hours 11 minutes 3 seconds  Total Procedure Duration: 0 hours 15 minutes 3 seconds       [facility]

## 2019-08-18 NOTE — H&P (Signed)
Amber Perez [id number] [birth year]      HPI:  74 y/o woman for screening colonoscopy. Tolerated prep well.   Medications Prior to Admission  Medication Sig Dispense Refill Last Dose  . amLODipine (NORVASC) 5 MG tablet TAKE 1 TABLET BY MOUTH EVERY DAY 30 tablet 1 Past Week at Unknown time  . Calcium-Magnesium (CAL-MAG PO) Take 2 tablets by mouth at bedtime. Takes occasionally   Past Week at Unknown time  . cetirizine (ZYRTEC) 10 MG tablet Take 1 tablet (10 mg total) by mouth daily. (Patient taking differently: Take 10 mg by mouth daily as needed (for allergies.). ) 90 tablet 4 Past Week at Unknown time  . Cholecalciferol (VITAMIN D3 PO) Take 2,500 Units by mouth at bedtime. Takes when remembers   Past Week at Unknown time  . Cyanocobalamin (VITAMIN B-12 PO) Take by mouth.   Past Week at Unknown time  . doxycycline (VIBRA-TABS) 100 MG tablet Take 1 tablet (100 mg total) by mouth 2 (two) times daily. 14 tablet 0 Past Week at Unknown time  . pantoprazole (PROTONIX) 40 MG tablet Take 1 tablet (40 mg total) by mouth daily. 30 tablet 3 Past Week at Unknown time  . pregabalin (LYRICA) 50 MG capsule Take 1 capsule (50 mg total) by mouth at bedtime. Take 1-3 hours prior to bed. 30 capsule 1 08/17/2019 at Unknown time  . PREMARIN vaginal cream PLACE 1 APPLICATORFUL VAGINALLY 2 TIMES A WEEK 30 g 1 Past Week at Unknown time  . triamcinolone (NASACORT AQ) 55 MCG/ACT AERO nasal inhaler Place 2 sprays into the nose as needed.   Past Week at Unknown time  . TURMERIC PO Take 200 mg by mouth at bedtime. Takes when remembers   Past Week at Unknown time  . pantoprazole (PROTONIX) 40 MG tablet TAKE 1 TABLET EVERY DAY 90 tablet 3    Allergies  Allergen Reactions  . Penicillins Hives and Rash    Also drop in BP Has patient had a PCN reaction causing immediate rash, facial/tongue/throat swelling, SOB or lightheadedness with hypotension:Yes Has patient had a PCN reaction causing severe rash involving mucus  membranes or skin necrosis:No Has patient had a PCN reaction that required hospitalization:No Has patient had a PCN reaction occurring within the last 10 years:No If all of the above answers are "NO", then may proceed with Cephalosporin use.   [name] Bisphosphonates     Severe GI upset, damaged esophagus.   Past Medical History:  Diagnosis Date  . Cancer of left kidney ([place])   . Closed left arm fracture    [facility]   . COPD (chronic obstructive pulmonary disease) ([place])   . Dyslipidemia   . GERD (gastroesophageal reflux disease)   . Hypertension 2008  . Osteoarthritis   . Osteoporosis   . Pneumonia 2008  . Pre-diabetes   . Thyroid nodule   . Vitamin D deficiency    Past Surgical History:  Procedure Laterality Date  . ABDOMINAL HYSTERECTOMY  1995  . BILATERAL SALPINGOOPHORECTOMY  1995  . BREAST BIOPSY Left    neg  . BREAST SURGERY     Benign per pt's report  . CATARACT EXTRACTION  2009  . CATARACT EXTRACTION    . CESAREAN SECTION  [id number]  . COLONOSCOPY  2007  . GANGLION CYST EXCISION    . KNEE ARTHROPLASTY Right 06/29/2016   Procedure: COMPUTER ASSISTED TOTAL KNEE ARTHROPLASTY;  Surgeon: [name], MD;  Location: [facility] ORS;  Service: Orthopedics;  Laterality: Right;  .  KNEE ARTHROSCOPY W/ MENISCAL REPAIR Right 04/2012   [name]  . PARTIAL NEPHRECTOMY Left   . UTERINE FIBROID SURGERY     x 5   Social History   Socioeconomic History  . Marital status: Divorced    Spouse name: Not on file  . Number of children: 2  . Years of education: Not on file  . Highest education level: Not on file  Occupational History  . 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 Smoking status: Former Smoker    Packs/day: 2.00    Years: 18.50    Pack years: 37.00    Types: Cigarettes    Quit date: 03/18/2006    Years since quitting: 13.4  . Smokeless tobacco: Never Used  Substance and Sexual Activity  . Alcohol use: Yes    Alcohol/week: 0.0 - 2.0  standard drinks    Comment: Occasional glass of wine, sometimes 2 in a week, sometimes 1 every couple of months.  . Drug use: No  . Sexual activity: Never  Other Topics Concern  . Not on file  Social History Narrative   Regular Exercise -  Occasional   Daily Caffeine Use:  2 cups coffee & Diet Mt Dew   Social Determinants of Health   Financial Resource Strain: Low Risk   . Difficulty of Paying Living Expenses: Not hard at all  Food Insecurity: No Food Insecurity  . Worried About Charity fundraiser in the Last Year: Never true  . Ran Out of Food in the Last Year: Never true  Transportation Needs: No Transportation Needs  . Lack of Transportation (Medical): No  . Lack of Transportation (Non-Medical): No  Physical Activity: Sufficiently Active  . Days of Exercise per Week: 3 days  . Minutes of Exercise per Session: 90 min  Stress: No Stress Concern Present  . Feeling of Stress : Not at all  Social Connections:   . Frequency of Communication with Friends and Family:   . Frequency of Social Gatherings with Friends and Family:   . Attends Religious Services:   . Active Member of Clubs or Organizations:   . Attends Archivist Meetings:   [name] Marital Status:   Intimate Partner Violence: Unknown  . Fear of Current or Ex-Partner: Not asked  . Emotionally Abused: Not on file  . Physically Abused: Not on file  . Sexually Abused: Not on file   Social History   Social History Narrative   Regular Exercise -  Occasional   Daily Caffeine Use:  2 cups coffee & Diet Mt Dew     ROS: Negative.     PE: HEENT: Negative. Lungs: Clear. Cardio: RR.  Assessment/Plan:  Proceed with planned endoscopy.   [place] [facility] 08/18/2019

## 2019-08-18 NOTE — Transfer of Care (Signed)
Immediate Anesthesia Transfer of Care Note  Patient: Amber Perez  Procedure(s) Performed: COLONOSCOPY WITH PROPOFOL (N/A )  Patient Location: PACU and Endoscopy Unit  Anesthesia Type:General  Level of Consciousness: awake  Airway & Oxygen Therapy: Patient Spontanous Breathing  Post-op Assessment: Report given to RN  Post vital signs: stable  Last Vitals:  Vitals Value Taken Time  BP 113/74 08/18/19 0803  Temp 36.7 C 08/18/19 0802  Pulse 82 08/18/19 0803  Resp 16 08/18/19 0803  SpO2 98 % 08/18/19 0803  Vitals shown include unvalidated device data.  Last Pain:  Vitals:   08/18/19 0802  TempSrc: Temporal  PainSc: 0-No pain         Complications: No apparent anesthesia complications

## 2019-09-07 ENCOUNTER — Other Ambulatory Visit: Payer: Self-pay | Admitting: Family Medicine

## 2019-09-07 DIAGNOSIS — K219 Gastro-esophageal reflux disease without esophagitis: Secondary | ICD-10-CM

## 2019-09-29 ENCOUNTER — Other Ambulatory Visit: Payer: Self-pay | Admitting: Family Medicine

## 2019-09-29 DIAGNOSIS — I10 Essential (primary) hypertension: Secondary | ICD-10-CM

## 2019-10-25 DIAGNOSIS — Z961 Presence of intraocular lens: Secondary | ICD-10-CM | POA: Diagnosis not present

## 2019-10-25 DIAGNOSIS — H04129 Dry eye syndrome of unspecified lacrimal gland: Secondary | ICD-10-CM | POA: Diagnosis not present

## 2019-10-25 DIAGNOSIS — H1045 Other chronic allergic conjunctivitis: Secondary | ICD-10-CM | POA: Diagnosis not present

## 2019-10-25 DIAGNOSIS — H26499 Other secondary cataract, unspecified eye: Secondary | ICD-10-CM | POA: Diagnosis not present

## 2019-11-03 ENCOUNTER — Ambulatory Visit: Payer: Medicare HMO

## 2019-11-14 ENCOUNTER — Ambulatory Visit (INDEPENDENT_AMBULATORY_CARE_PROVIDER_SITE_OTHER): Payer: Medicare HMO

## 2019-11-14 VITALS — Ht 59.5 in | Wt 178.0 lb

## 2019-11-14 DIAGNOSIS — Z0000 Encounter for general adult medical examination without abnormal findings: Secondary | ICD-10-CM

## 2019-11-14 NOTE — Patient Instructions (Addendum)
Amber Perez , Thank you for taking time to come for your Medicare Wellness Visit. I appreciate your ongoing commitment to your health goals. Please review the following plan we discussed and let me know if I can assist you in the future.   These are the goals we discussed: Goals      Patient Stated   .  Increase physical activity (pt-stated)      Water aerobics 4 days weekly, 90 minutes       This is a list of the screening recommended for you and due dates:  Health Maintenance  Topic Date Due  . Flu Shot  11/05/2019  . Mammogram  01/03/2020  . Tetanus Vaccine  10/14/2027  . Colon Cancer Screening  08/17/2029  . DEXA scan (bone density measurement)  Completed  . COVID-19 Vaccine  Completed  .  Hepatitis C: One time screening is recommended by Center for Disease Control  (CDC) for  adults born from [birth year] through 1964.   Completed  . Pneumonia vaccines  Completed    Immunizations Immunization History  Administered Date(s) Administered  . Fluad Quad(high Dose 65+) 01/26/2019  . Influenza Split 03/19/2011, 01/05/2012  . Influenza,inj,Quad PF,6+ Mos 12/21/2013  . Influenza-Unspecified 01/11/2015  . PFIZER SARS-COV-2 Vaccination 06/04/2019, 06/27/2019  . Pneumococcal Conjugate-13 06/08/2014  . Pneumococcal Polysaccharide-23 03/24/2012  . Tdap 05/07/2006, 10/13/2017  . Zoster 06/30/2012   Keep all routine maintenance appointments.   Follow up 11/29/19 @ 11:30  Advanced directives: End of life planning; Advance aging; Advanced directives discussed.  Copy of current HCPOA/Living Will requested.    Conditions/risks identified: none new  Follow up in one year for your annual wellness visit.   Preventive Care 71 Years and Older, Female Preventive care refers to lifestyle choices and visits with your health care provider that can promote health and wellness. What does preventive care include?  A yearly physical exam. This is also called an annual well check.  Dental exams  once or twice a year.  Routine eye exams. Ask your health care provider how often you should have your eyes checked.  Personal lifestyle choices, including:  Daily care of your teeth and gums.  Regular physical activity.  Eating a healthy diet.  Avoiding tobacco and drug use.  Limiting alcohol use.  Practicing safe sex.  Taking low-dose aspirin every day.  Taking vitamin and mineral supplements as recommended by your health care provider. What happens during an annual well check? The services and screenings done by your health care provider during your annual well check will depend on your age, overall health, lifestyle risk factors, and family history of disease. Counseling  Your health care provider may ask you questions about your:  Alcohol use.  Tobacco use.  Drug use.  Emotional well-being.  Home and relationship well-being.  Sexual activity.  Eating habits.  History of falls.  Memory and ability to understand (cognition).  Work and work Statistician.  Reproductive health. Screening  You may have the following tests or measurements:  Height, weight, and BMI.  Blood pressure.  Lipid and cholesterol levels. These may be checked every 5 years, or more frequently if you are over 20 years old.  Skin check.  Lung cancer screening. You may have this screening every year starting at age 23 if you have a 30-pack-year history of smoking and currently smoke or have quit within the past 15 years.  Fecal occult blood test (FOBT) of the stool. You may have this test every year starting  at age 84.  Flexible sigmoidoscopy or colonoscopy. You may have a sigmoidoscopy every 5 years or a colonoscopy every 10 years starting at age 78.  Hepatitis C blood test.  Hepatitis B blood test.  Sexually transmitted disease (STD) testing.  Diabetes screening. This is done by checking your blood sugar (glucose) after you have not eaten for a while (fasting). You may have  this done every 1-3 years.  Bone density scan. This is done to screen for osteoporosis. You may have this done starting at age 76.  Mammogram. This may be done every 1-2 years. Talk to your health care provider about how often you should have regular mammograms. Talk with your health care provider about your test results, treatment options, and if necessary, the need for more tests. Vaccines  Your health care provider may recommend certain vaccines, such as:  Influenza vaccine. This is recommended every year.  Tetanus, diphtheria, and acellular pertussis (Tdap, Td) vaccine. You may need a Td booster every 10 years.  Zoster vaccine. You may need this after age 57.  Pneumococcal 13-valent conjugate (PCV13) vaccine. One dose is recommended after age 36.  Pneumococcal polysaccharide (PPSV23) vaccine. One dose is recommended after age 64. Talk to your health care provider about which screenings and vaccines you need and how often you need them. This information is not intended to replace advice given to you by your health care provider. Make sure you discuss any questions you have with your health care provider. Document Released: 04/19/2015 Document Revised: 12/11/2015 Document Reviewed: 01/22/2015 Elsevier Interactive Patient Education  2017 [place] Prevention in the Home Falls can cause injuries. They can happen to people of all ages. There are many things you can do to make your home safe and to help prevent falls. What can I do on the outside of my home?  Regularly fix the edges of walkways and driveways and fix any cracks.  Remove anything that might make you trip as you walk through a door, such as a raised step or threshold.  Trim any bushes or trees on the path to your home.  Use bright outdoor lighting.  Clear any walking paths of anything that might make someone trip, such as rocks or tools.  Regularly check to see if handrails are loose or broken. Make sure  that both sides of any steps have handrails.  Any raised decks and porches should have guardrails on the edges.  Have any leaves, snow, or ice cleared regularly.  Use sand or salt on walking paths during winter.  Clean up any spills in your garage right away. This includes oil or grease spills. What can I do in the bathroom?  Use night lights.  Install grab bars by the toilet and in the tub and shower. Do not use towel bars as grab bars.  Use non-skid mats or decals in the tub or shower.  If you need to sit down in the shower, use a plastic, non-slip stool.  Keep the floor dry. Clean up any water that spills on the floor as soon as it happens.  Remove soap buildup in the tub or shower regularly.  Attach bath mats securely with double-sided non-slip rug tape.  Do not have throw rugs and other things on the floor that can make you trip. What can I do in the bedroom?  Use night lights.  Make sure that you have a light by your bed that is easy to reach.  Do not use  any sheets or blankets that are too big for your bed. They should not hang down onto the floor.  Have a firm chair that has side arms. You can use this for support while you get dressed.  Do not have throw rugs and other things on the floor that can make you trip. What can I do in the kitchen?  Clean up any spills right away.  Avoid walking on wet floors.  Keep items that you use a lot in easy-to-reach places.  If you need to reach something above you, use a strong step stool that has a grab bar.  Keep electrical cords out of the way.  Do not use floor polish or wax that makes floors slippery. If you must use wax, use non-skid floor wax.  Do not have throw rugs and other things on the floor that can make you trip. What can I do with my stairs?  Do not leave any items on the stairs.  Make sure that there are handrails on both sides of the stairs and use them. Fix handrails that are broken or loose. Make  sure that handrails are as long as the stairways.  Check any carpeting to make sure that it is firmly attached to the stairs. Fix any carpet that is loose or worn.  Avoid having throw rugs at the top or bottom of the stairs. If you do have throw rugs, attach them to the floor with carpet tape.  Make sure that you have a light switch at the top of the stairs and the bottom of the stairs. If you do not have them, ask someone to add them for you. What else can I do to help prevent falls?  Wear shoes that:  Do not have high heels.  Have rubber bottoms.  Are comfortable and fit you well.  Are closed at the toe. Do not wear sandals.  If you use a stepladder:  Make sure that it is fully opened. Do not climb a closed stepladder.  Make sure that both sides of the stepladder are locked into place.  Ask someone to hold it for you, if possible.  Clearly mark and make sure that you can see:  Any grab bars or handrails.  First and last steps.  Where the edge of each step is.  Use tools that help you move around (mobility aids) if they are needed. These include:  Canes.  Walkers.  Scooters.  Crutches.  Turn on the lights when you go into a dark area. Replace any light bulbs as soon as they burn out.  Set up your furniture so you have a clear path. Avoid moving your furniture around.  If any of your floors are uneven, fix them.  If there are any pets around you, be aware of where they are.  Review your medicines with your doctor. Some medicines can make you feel dizzy. This can increase your chance of falling. Ask your doctor what other things that you can do to help prevent falls. This information is not intended to replace advice given to you by your health care provider. Make sure you discuss any questions you have with your health care provider. Document Released: 01/17/2009 Document Revised: 08/29/2015 Document Reviewed: 04/27/2014 Elsevier Interactive Patient Education   2017 [employer].

## 2019-11-14 NOTE — Progress Notes (Signed)
Subjective:   Amber Perez is a 74 y.o. female who presents for Medicare Annual (Subsequent) preventive examination.  Review of Systems    No ROS.  Medicare Wellness Virtual Visit.    Cardiac Risk Factors include: advanced age (>14mn, >>31women);hypertension     Objective:    Today's Vitals   11/14/19 1306  Weight: 178 lb (80.7 kg)  Height: 4' 11.5" (1.511 m)   Body mass index is 35.35 kg/m.  Advanced Directives 11/14/2019 08/18/2019 03/01/2019 11/02/2018 11/04/2016 06/29/2016 06/17/2016  Does Patient Have a Medical Advance Directive? Yes No No No No No No  Type of Advance Directive Living will;Healthcare Power of Attorney - - - - - -  Does patient want to make changes to medical advance directive? No - Patient declined - - - - - -  [place] of [place]? No - copy requested - - - - - -  Would patient like information on creating a medical advance directive? - - - Yes (MAU/Ambulatory/Procedural Areas - Information given) No - Patient declined No - Patient declined Yes (MAU/Ambulatory/Procedural Areas - Information given)    Current Medications (verified) Outpatient Encounter Medications as of 11/14/2019  Medication Sig  . amLODipine (NORVASC) 5 MG tablet TAKE 1 TABLET BY MOUTH EVERY DAY  . Calcium-Magnesium (CAL-MAG PO) Take 2 tablets by mouth at bedtime. Takes occasionally  . cetirizine (ZYRTEC) 10 MG tablet Take 1 tablet (10 mg total) by mouth daily. (Patient taking differently: Take 10 mg by mouth daily as needed (for allergies.). )  . Cholecalciferol (VITAMIN D3 PO) Take 2,500 Units by mouth at bedtime. Takes when remembers  . Cyanocobalamin (VITAMIN B-12 PO) Take by mouth.  . doxycycline (VIBRA-TABS) 100 MG tablet Take 1 tablet (100 mg total) by mouth 2 (two) times daily.  . pantoprazole (PROTONIX) 40 MG tablet TAKE 1 TABLET BY MOUTH EVERY DAY  . pregabalin (LYRICA) 50 MG capsule Take 1 capsule (50 mg total) by mouth at bedtime. Take 1-3 hours  prior to bed.  .Marland KitchenPREMARIN vaginal cream PLACE 1 APPLICATORFUL VAGINALLY 2 TIMES A WEEK  . triamcinolone (NASACORT AQ) 55 MCG/ACT AERO nasal inhaler Place 2 sprays into the nose as needed.  . TURMERIC PO Take 200 mg by mouth at bedtime. Takes when remembers   No facility-administered encounter medications on file as of 11/14/2019.    Allergies (verified) Penicillins and Bisphosphonates   History: Past Medical History:  Diagnosis Date  . Cancer of left kidney ([facility]   . Closed left arm fracture    [facility]   . COPD (chronic obstructive pulmonary disease) ([facility]   . Dyslipidemia   . GERD (gastroesophageal reflux disease)   . Hypertension 2008  . Osteoarthritis   . Osteoporosis   . Pneumonia 2008  . Pre-diabetes   . Thyroid nodule   . Vitamin D deficiency    Past Surgical History:  Procedure Laterality Date  . ABDOMINAL HYSTERECTOMY  1995  . BILATERAL SALPINGOOPHORECTOMY  1995  . BREAST BIOPSY Left    neg  . BREAST SURGERY     Benign per pt's report  . CATARACT EXTRACTION  2009  . CATARACT EXTRACTION    . CESAREAN SECTION  11443,1540 . COLONOSCOPY  2007  . COLONOSCOPY WITH PROPOFOL N/A 08/18/2019   Procedure: COLONOSCOPY WITH PROPOFOL;  Surgeon: BRobert Bellow MD;  Location: [facility] END[facility]COPY;  Service: Endoscopy;  Laterality: N/A;  . GANGLION CYST EXCISION    . KNEE ARTHROPLASTY Right  06/29/2016   Procedure: COMPUTER ASSISTED TOTAL KNEE ARTHROPLASTY;  Surgeon: [name], MD;  Location: [facility] ORS;  Service: Orthopedics;  Laterality: Right;  . KNEE ARTHROSCOPY W/ MENISCAL REPAIR Right 04/2012   [name]  . PARTIAL NEPHRECTOMY Left   . UTERINE FIBROID SURGERY     x 5   Family History  Problem Relation Age of Onset  . Anemia Mother        aplastic  . Aplastic anemia Mother   . Throat cancer Father   . Hypertension Father   . Alcohol abuse Father   . COPD Father   . Diabetes Sister   . Esophageal cancer Brother   . Colon polyps Sister   . Diabetes  Brother   . Multiple myeloma Brother   . Diabetes Brother   . Colon cancer Maternal Aunt   . Breast cancer Neg Hx    Social History   Socioeconomic History  . Marital status: Divorced    Spouse name: Not on file  . Number of children: 2  . Years of education: Not on file  . Highest education level: Not on file  Occupational History  . 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 Smoking status: Former Smoker    Packs/day: 2.00    Years: 18.50    Pack years: 37.00    Types: Cigarettes    Quit date: 03/18/2006    Years since quitting: 13.6  . Smokeless tobacco: Never Used  Vaping Use  . Vaping Use: Never used  Substance and Sexual Activity  . Alcohol use: Yes    Alcohol/week: 0.0 - 2.0 standard drinks    Comment: Occasional glass of wine, sometimes 2 in a week, sometimes 1 every couple of months.  . Drug use: No  . Sexual activity: Never  Other Topics Concern  . Not on file  Social History Narrative   Regular Exercise -  Occasional   Daily Caffeine Use:  2 cups coffee & Diet Mt Dew   Social Determinants of Health   Financial Resource Strain: Low Risk   . Difficulty of Paying Living Expenses: Not hard at all  Food Insecurity: No Food Insecurity  . Worried About Charity fundraiser in the Last Year: Never true  . Ran Out of Food in the Last Year: Never true  Transportation Needs: No Transportation Needs  . Lack of Transportation (Medical): No  . Lack of Transportation (Non-Medical): No  Physical Activity:   . Days of Exercise per Week:   . Minutes of Exercise per Session:   Stress: No Stress Concern Present  . Feeling of Stress : Not at all  Social Connections: Unknown  . Frequency of Communication with Friends and Family: More than three times a week  . Frequency of Social Gatherings with Friends and Family: More than three times a week  . Attends Religious Services: Not on file  . Active Member of Clubs or Organizations: Not on file  . Attends  Archivist Meetings: Not on file  . Marital Status: Not on file    Tobacco Counseling Counseling given: Not Answered   Clinical Intake:  Pre-visit preparation completed: Yes        Diabetes: No  How often do you need to have someone help you when you read instructions, pamphlets, or other written materials from your doctor or pharmacy?: 1 - Never   Interpreter Needed?: No      Activities of Daily Living In  your present state of health, do you have any difficulty performing the following activities: 11/14/2019  Hearing? Y  Comment Hearing aids  Vision? N  Difficulty concentrating or making decisions? N  Walking or climbing stairs? N  Dressing or bathing? N  Doing errands, shopping? N  Preparing Food and eating ? N  Using the Toilet? N  In the past six months, have you accidently leaked urine? N  Do you have problems with loss of bowel control? N  Managing your Medications? N  Managing your Finances? N  Housekeeping or managing your Housekeeping? N  Some recent data might be hidden    Patient Care Team: Leone Haven, MD as PCP - General (Family Medicine)  Indicate any recent Medical Services you may have received from other than [facility] providers in the past year (date may be approximate).     Assessment:   This is a routine wellness examination for Amber Perez.  I connected with Amber Perez today by telephone and verified that I am speaking with the correct person using two identifiers. Location patient: home Location provider: work Persons participating in the virtual visit: patient, Marine scientist.    I discussed the limitations, risks, security and privacy concerns of performing an evaluation and management service by telephone and the availability of in person appointments. The patient expressed understanding and verbally consented to this telephonic visit.    Interactive audio and video telecommunications were attempted between this provider and patient,  however failed, due to patient having technical difficulties OR patient did not have access to video capability.  We continued and completed visit with audio only.  Some vital signs may be absent or patient reported.   Hearing/Vision screen  Hearing Screening   '125Hz'  '250Hz'  '500Hz'  '1000Hz'  '2000Hz'  '3000Hz'  '4000Hz'  '6000Hz'  '8000Hz'   Right ear:           Left ear:           Comments: Patient is able to hear conversational tones without difficulty.  No issues reported.  Vision Screening Comments: Visual acuity not assessed, virtual visit.  They have seen their ophthalmologist.    Dietary issues and exercise activities discussed: Current Exercise Habits: Home exercise routine, Type of exercise: calisthenics (water aerobics), Time (Minutes): > 60, Frequency (Times/Week): 3, Weekly Exercise (Minutes/Week): 0, Intensity: Mild  Goals      Patient Stated   .  Increase physical activity (pt-stated)      Water aerobics 4 days weekly, 90 minutes      Depression Screen PHQ 2/9 Scores 11/14/2019 08/16/2019 06/06/2019 04/04/2019 11/02/2018 11/04/2016 07/06/2016  PHQ - 2 Score 0 0 0 0 0 0 0  PHQ- 9 Score - - - - - 0 -    Fall Risk Fall Risk  11/14/2019 08/16/2019 06/06/2019 04/04/2019 11/02/2018  Falls in the past year? 0 0 0 1 0  Number falls in past yr: 0 0 0 0 -  Injury with Fall? - - - 1 -  Risk Factor Category  - - - - -  Risk for fall due to : - - - - -  Follow up Falls evaluation completed Falls evaluation completed Falls evaluation completed Falls evaluation completed -   Handrails in use when climbing stairs? Yes  Home free of loose throw rugs in walkways, pet beds, electrical cords, etc? Yes  Adequate lighting in your home to reduce risk of falls? Yes   ASSISTIVE DEVICES UTILIZED TO PREVENT FALLS:  Use of a cane, walker or w/c? No  Grab  bars in the bathroom? No  Shower chair or bench in shower? No  Elevated toilet seat or a handicapped toilet? No   TIMED UP AND GO:  Was the test performed? No .  Virtual visit.   Cognitive Function: MMSE - Mini Mental State Exam 11/14/2019 11/04/2016  Not completed: Unable to complete -  Orientation to time - 5  Orientation to Place - 5  Registration - 3  Attention/ Calculation - 5  Recall - 3  Language- name 2 objects - 2  Language- repeat - 1  Language- follow 3 step command - 3  Language- read & follow direction - 1  Write a sentence - 1  Copy design - 1  Total score - 30     6CIT Screen 11/02/2018  What Year? 0 points  What month? 0 points  What time? 0 points  Count back from 20 0 points  Months in reverse 0 points  Repeat phrase 0 points  Total Score 0    Immunizations Immunization History  Administered Date(s) Administered  . Fluad Quad(high Dose 65+) 01/26/2019  . Influenza Split 03/19/2011, 01/05/2012  . Influenza,inj,Quad PF,6+ Mos 12/21/2013  . Influenza-Unspecified 01/11/2015  . PFIZER SARS-COV-2 Vaccination 06/04/2019, 06/27/2019  . Pneumococcal Conjugate-13 06/08/2014  . Pneumococcal Polysaccharide-23 03/24/2012  . Tdap 05/07/2006, 10/13/2017  . Zoster 06/30/2012    Health Maintenance Health Maintenance  Topic Date Due  . INFLUENZA VACCINE  11/05/2019  . MAMMOGRAM  01/03/2020  . TETANUS/TDAP  10/14/2027  . COLONOSCOPY  08/17/2029  . DEXA SCAN  Completed  . COVID-19 Vaccine  Completed  . Hepatitis C Screening  Completed  . PNA vac Low Risk Adult  Completed    Dental Screening: Recommended annual dental exams for proper oral hygiene.   Community Resource Referral / Chronic Care Management: CRR required this visit?  No   CCM required this visit?  No      Plan:   Keep all routine maintenance appointments.   Follow up 11/29/19 @ 11:30  I have personally reviewed and noted the following in the patient's chart:   . Medical and social history . Use of alcohol, tobacco or illicit drugs  . Current medications and supplements . Functional ability and status . Nutritional status . Physical  activity . Advanced directives . List of other physicians . Hospitalizations, surgeries, and ER visits in previous 12 months . Vitals . Screenings to include cognitive, depression, and falls . Referrals and appointments  In addition, I have reviewed and discussed with patient certain preventive protocols, quality metrics, and best practice recommendations. A written personalized care plan for preventive services as well as general preventive health recommendations were provided to patient via mychart.     Varney Biles, LPN   [id number]

## 2019-11-27 ENCOUNTER — Other Ambulatory Visit: Payer: Self-pay | Admitting: Family Medicine

## 2019-11-27 DIAGNOSIS — I10 Essential (primary) hypertension: Secondary | ICD-10-CM

## 2019-11-29 ENCOUNTER — Other Ambulatory Visit: Payer: Self-pay

## 2019-11-29 ENCOUNTER — Ambulatory Visit (INDEPENDENT_AMBULATORY_CARE_PROVIDER_SITE_OTHER): Payer: Medicare HMO | Admitting: Family Medicine

## 2019-11-29 ENCOUNTER — Encounter: Payer: Self-pay | Admitting: Family Medicine

## 2019-11-29 VITALS — BP 116/80 | HR 85 | Temp 97.3°F | Ht 59.13 in | Wt 194.0 lb

## 2019-11-29 DIAGNOSIS — M25562 Pain in left knee: Secondary | ICD-10-CM

## 2019-11-29 DIAGNOSIS — G8929 Other chronic pain: Secondary | ICD-10-CM

## 2019-11-29 DIAGNOSIS — E669 Obesity, unspecified: Secondary | ICD-10-CM | POA: Diagnosis not present

## 2019-11-29 DIAGNOSIS — R109 Unspecified abdominal pain: Secondary | ICD-10-CM

## 2019-11-29 DIAGNOSIS — Z6836 Body mass index (BMI) 36.0-36.9, adult: Secondary | ICD-10-CM

## 2019-11-29 DIAGNOSIS — Z1231 Encounter for screening mammogram for malignant neoplasm of breast: Secondary | ICD-10-CM | POA: Diagnosis not present

## 2019-11-29 DIAGNOSIS — R202 Paresthesia of skin: Secondary | ICD-10-CM | POA: Diagnosis not present

## 2019-11-29 DIAGNOSIS — I10 Essential (primary) hypertension: Secondary | ICD-10-CM

## 2019-11-29 DIAGNOSIS — Z0000 Encounter for general adult medical examination without abnormal findings: Secondary | ICD-10-CM

## 2019-11-29 DIAGNOSIS — E782 Mixed hyperlipidemia: Secondary | ICD-10-CM

## 2019-11-29 DIAGNOSIS — Z780 Asymptomatic menopausal state: Secondary | ICD-10-CM | POA: Diagnosis not present

## 2019-11-29 DIAGNOSIS — Z0001 Encounter for general adult medical examination with abnormal findings: Secondary | ICD-10-CM

## 2019-11-29 DIAGNOSIS — E66812 Obesity, class 2: Secondary | ICD-10-CM | POA: Insufficient documentation

## 2019-11-29 LAB — COMPREHENSIVE METABOLIC PANEL
ALT: 39 U/L — ABNORMAL HIGH (ref 0–35)
AST: 30 U/L (ref 0–37)
Albumin: 4.2 g/dL (ref 3.5–5.2)
Alkaline Phosphatase: 52 U/L (ref 39–117)
BUN: 16 mg/dL (ref 6–23)
CO2: 27 mEq/L (ref 19–32)
Calcium: 9.3 mg/dL (ref 8.4–10.5)
Chloride: 104 mEq/L (ref 96–112)
Creatinine, Ser: 0.76 mg/dL (ref 0.40–1.20)
GFR: 74.65 mL/min (ref >60.00)
Glucose, Bld: 109 mg/dL — ABNORMAL HIGH (ref 70–99)
Potassium: 4 mEq/L (ref 3.5–5.1)
Sodium: 138 mEq/L (ref 135–145)
Total Bilirubin: 0.4 mg/dL (ref 0.2–1.2)
Total Protein: 7 g/dL (ref 6.0–8.3)

## 2019-11-29 LAB — LIPID PANEL
Cholesterol: 228 mg/dL — ABNORMAL HIGH (ref 0–200)
HDL: 54.6 mg/dL (ref >39.00)
LDL Cholesterol: 135 mg/dL — ABNORMAL HIGH (ref 0–99)
NonHDL: 172.93
Total CHOL/HDL Ratio: 4
Triglycerides: 191 mg/dL — ABNORMAL HIGH (ref 0.0–149.0)
VLDL: 38.2 mg/dL (ref 0.0–40.0)

## 2019-11-29 LAB — CBC
HCT: 38.7 % (ref 36.0–46.0)
Hemoglobin: 12.6 g/dL (ref 12.0–15.0)
MCHC: 32.6 g/dL (ref 30.0–36.0)
MCV: 90.8 fl (ref 78.0–100.0)
Platelets: 236 10*3/uL (ref 150.0–400.0)
RBC: 4.27 Mil/uL (ref 3.87–5.11)
RDW: 15.3 % (ref 11.5–15.5)
WBC: 6.3 10*3/uL (ref 4.0–10.5)

## 2019-11-29 LAB — TSH: TSH: 2.35 u[IU]/mL (ref 0.35–4.50)

## 2019-11-29 LAB — HEMOGLOBIN A1C: Hgb A1c MFr Bld: 6.2 % (ref 4.6–6.5)

## 2019-11-29 LAB — VITAMIN B12: Vitamin B-12: 718 pg/mL (ref 211–911)

## 2019-11-29 MED ORDER — PREMARIN 0.625 MG/GM VA CREA: TOPICAL_CREAM | VAGINAL | 1 refills | Status: DC

## 2019-11-29 NOTE — Progress Notes (Signed)
Patient presenting with bilateral knee pain. States she has had the right knee replaced and pain is mainly in the left knee. States she may need a referral to Ortho as she does not want to see who she saw before.   Patient flagged: Current status:  PATIENT IS OVERDUE FOR BMI FOLLOW UP PLAN BMI is estimated to be 39 based on the last recorded weight and height

## 2019-11-29 NOTE — Assessment & Plan Note (Signed)
Only noted on exam to be a slight tinge of discomfort.  Suspect related to scar tissue given lack of other abdominal pain or symptoms.  If she starts to notice abdominal discomfort at any other time she will let us know.

## 2019-11-29 NOTE — Assessment & Plan Note (Signed)
Refer to orthopedics 

## 2019-11-29 NOTE — Assessment & Plan Note (Signed)
Physical exam completed.  Encouraged adding in walking for exercise in addition to her pool work.  Discussed Mediterranean diet which the patient brought up and advised to monitor her portion sizes with that.  Mammogram and bone density scan ordered.  Patient will call to schedule those.  I encouraged her to get the Shingrix vaccine at the pharmacy.  She will get her flu shot later in the season.  She will continue CT scan through the cancer center for lung cancer screening.  Lab work as outlined below.

## 2019-11-29 NOTE — Patient Instructions (Signed)
Nice to see you. I have referred you to orthopedics. Please call to schedule your mammogram and bone density scan. We will get lab work today and contact you with the results.

## 2019-11-29 NOTE — Progress Notes (Signed)
[name], MD Phone: [phone]  Amber Perez is a 74 y.o. female who presents today for CPE.  Diet: Patient know she has been eating too large of a quantity of food. Exercise: Goes to the pool 2 to 3 days a week. Pap smear: Status post BSO and hysterectomy Colonoscopy: 08/18/2019, no polyps Mammogram: 01/03/19.  Negative. Family history-  Colon cancer: Paternal aunt  Breast cancer: No  Ovarian cancer: Maternal aunt Menses: Status post hysterectomy Vaccines-   Flu: She will get this later in the flu season  Tetanus: Up-to-date  Shingles: Due to get through the pharmacy  COVID19: Up-to-date  Pneumonia: Up-to-date Hep C Screening: Up-to-date Tobacco use: Quit about 14 years ago, patient is followed with the lung cancer screening program at the cancer center Alcohol use: Rare Illicit Drug use: No Dentist: Yes Ophthalmology: Yes  Left knee pain: This has been going on for some time.  Feels like arthritic pain.  No specific injury.  She had her right knee replaced.  She takes ibuprofen for a few days at a time and notes that decreases the inflammation.  She wonders about referral to orthopedics.  Finger tingling, left hand: Notes this has been going on intermittently for a year.  Typically before it would only happen if she was holding her arm in an odd position though recently she has had a little bit of tingling in her fingertips from her index finger to her ring finger that occurs intermittently.  She feels as though the gabapentin helps with that.  On exam she was noted to have a slight discomfort on palpation of her periumbilical area.  She notes she is status post hysterectomy and BSO.  She notes no urinary issues, constipation, or blood in her stool.  Recent colonoscopy unremarkable.  She notes she is having a CT scan in the next month or so to follow-up on her kidney cancer for which she is status post partial nephrectomy.  She has not had any abdominal pain prior to  now.    Active Ambulatory Problems    Diagnosis Date Noted  . GERD (gastroesophageal reflux disease) 03/19/2011  . Allergic rhinitis 03/19/2011  . Thyroid nodule 11/25/2011  . Osteopenia 11/25/2011  . Hyperlipidemia 11/25/2011  . Atrophic vaginitis 03/24/2012  . Essential hypertension 01/25/2014  . Obesity (BMI 30-39.9) 01/25/2014  . S/P total knee arthroplasty 06/29/2016  . Right knee pain 12/14/2016  . Rib pain 06/23/2017  . Encounter for general adult medical examination with abnormal findings 08/04/2017  . Dyspnea on exertion 07/14/2018  . Breast pain 07/20/2018  . Hip pain 04/05/2019  . Overactive bladder 04/05/2019  . Cancer of kidney ([place]) 04/05/2019  . Tingling 04/05/2019  . Lightheadedness 04/05/2019  . Sinusitis 06/06/2019  . Low vitamin B12 level 08/16/2019  . RLS (restless legs syndrome) 08/16/2019  . Vitamin D deficiency 08/16/2019  . Class 2 severe obesity due to excess calories with serious comorbidity and body mass index (BMI) of 36.0 to 36.9 in adult [facility]) 11/29/2019  . Chronic pain of left knee 11/29/2019  . Abdominal discomfort 11/29/2019   Resolved Ambulatory Problems    Diagnosis Date Noted  . Osteoporosis 03/19/2011  . Welcome to Medicare preventive visit 03/24/2012  . Osteoarthritis of right knee 03/24/2012  . Medicare annual wellness visit, subsequent 06/20/2013  . Family history of diabetes mellitus 06/20/2013  . Elevated BP 12/21/2013  . Elevated LFTs 12/21/2013  . Acute maxillary sinusitis 06/21/2014   Past Medical History:  Diagnosis Date  .  Cancer of left kidney ([place])   . Closed left arm fracture   . COPD (chronic obstructive pulmonary disease) ([place])   . Dyslipidemia   . Hypertension 2008  . Osteoarthritis   . Pneumonia 2008  . Pre-diabetes   . Vitamin D deficiency     Family History  Problem Relation Age of Onset  . Anemia Mother        aplastic  . Aplastic anemia Mother   . Throat cancer Father   . Hypertension Father   .  Alcohol abuse Father   . COPD Father   . Diabetes Sister   . Esophageal cancer Brother   . Colon polyps Sister   . Diabetes Brother   . Multiple myeloma Brother   . Diabetes Brother   . Colon cancer Maternal Aunt   . Breast cancer Neg Hx     Social History   Socioeconomic History  . Marital status: Divorced    Spouse name: Not on file  . Number of children: 2  . Years of education: Not on file  . Highest education level: Not on file  Occupational History  . 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 Smoking status: Former Smoker    Packs/day: 2.00    Years: 18.50    Pack years: 37.00    Types: Cigarettes    Quit date: 03/18/2006    Years since quitting: 13.7  . Smokeless tobacco: Never Used  Vaping Use  . Vaping Use: Never used  Substance and Sexual Activity  . Alcohol use: Yes    Alcohol/week: 0.0 - 2.0 standard drinks    Comment: Occasional glass of wine, sometimes 2 in a week, sometimes 1 every couple of months.  . Drug use: No  . Sexual activity: Never  Other Topics Concern  . Not on file  Social History Narrative   Regular Exercise -  Occasional   Daily Caffeine Use:  2 cups coffee & Diet Mt Dew   Social Determinants of Health   Financial Resource Strain: Low Risk   . Difficulty of Paying Living Expenses: Not hard at all  Food Insecurity: No Food Insecurity  . Worried About Charity fundraiser in the Last Year: Never true  . Ran Out of Food in the Last Year: Never true  Transportation Needs: No Transportation Needs  . Lack of Transportation (Medical): No  . Lack of Transportation (Non-Medical): No  Physical Activity:   . Days of Exercise per Week: Not on file  . Minutes of Exercise per Session: Not on file  Stress: No Stress Concern Present  . Feeling of Stress : Not at all  Social Connections: Unknown  . Frequency of Communication with Friends and Family: More than three times a week  . Frequency of Social Gatherings with Friends  and Family: More than three times a week  . Attends Religious Services: Not on file  . Active Member of Clubs or Organizations: Not on file  . Attends Archivist Meetings: Not on file  . Marital Status: Not on file  Intimate Partner Violence: Not At Risk  . Fear of Current or Ex-Partner: No  . Emotionally Abused: No  . Physically Abused: No  . Sexually Abused: No    ROS  General:  Negative for nexplained weight loss, fever Skin: Negative for new or changing mole, sore that won't heal HEENT: Negative for trouble hearing, trouble seeing, ringing in ears, mouth sores, hoarseness, change in voice, dysphagia.  CV: Positive for edema, negative for chest pain, dyspnea, palpitations Resp: Negative for cough, dyspnea, hemoptysis GI: Negative for nausea, vomiting, diarrhea, constipation, abdominal pain, melena, hematochezia. GU: Negative for dysuria, incontinence, urinary hesitance, hematuria, vaginal or penile discharge, polyuria, sexual difficulty, lumps in testicle or breasts MSK: Positive for muscle cramps or aches, joint pain or swelling Neuro: Positive for numbness, negative for headaches, weakness, dizziness, passing out/fainting Psych: Negative for depression, anxiety, memory problems  Objective  Physical Exam Vitals:   11/29/19 1133  BP: 116/80  Pulse: 85  Temp: (!) 97.3 F (36.3 C)  SpO2: 97%    BP Readings from Last 3 Encounters:  11/29/19 116/80  08/18/19 118/73  08/16/19 130/80   Wt Readings from Last 3 Encounters:  11/29/19 194 lb (88 kg)  11/14/19 178 lb (80.7 kg)  08/18/19 178 lb (80.7 kg)    Physical Exam Constitutional:      General: She is not in acute distress.    Appearance: She is not diaphoretic.  HENT:     Head: Normocephalic and atraumatic.  Eyes:     Conjunctiva/sclera: Conjunctivae normal.     Pupils: Pupils are equal, round, and reactive to light.  Cardiovascular:     Rate and Rhythm: Normal rate and regular rhythm.     Heart  sounds: Normal heart sounds.  Pulmonary:     Effort: Pulmonary effort is normal.     Breath sounds: Normal breath sounds.  Abdominal:     General: Bowel sounds are normal. There is no distension.     Palpations: Abdomen is soft. There is no mass.     Tenderness: There is no guarding or rebound.     Hernia: No hernia is present.     Comments: Slight discomfort on palpation of the area inferior to her umbilicus  Musculoskeletal:     Right lower leg: No edema.     Left lower leg: No edema.     Comments: Left knee with no warmth, swelling, tenderness, or erythema, negative McMurray's, negative Tinel's and Phalen's bilateral wrists  Skin:    General: Skin is warm and dry.  Neurological:     Mental Status: She is alert.     Comments: 5/5 strength in bilateral biceps, triceps, grip, quads, hamstrings, plantar and dorsiflexion, sensation to light touch intact in bilateral UE and LE, normal gait  Psychiatric:        Mood and Affect: Mood normal.      Assessment/Plan:   Encounter for general adult medical examination with abnormal findings Physical exam completed.  Encouraged adding in walking for exercise in addition to her pool work.  Discussed Mediterranean diet which the patient brought up and advised to monitor her portion sizes with that.  Mammogram and bone density scan ordered.  Patient will call to schedule those.  I encouraged her to get the Shingrix vaccine at the pharmacy.  She will get her flu shot later in the season.  She will continue CT scan through the cancer center for lung cancer screening.  Lab work as outlined below.  Obesity (BMI 30-39.9) Encouraged diet and exercise.  Class 2 severe obesity due to excess calories with serious comorbidity and body mass index (BMI) of 36.0 to 36.9 in adult [facility]) Encouraged diet and exercise.  Tingling Recheck B12.  Also checking A1c.  Discussed given that this is more of a persistent issue we will likely have her see  neurology.  Chronic pain of left knee Refer to orthopedics.  Abdominal  discomfort Only noted on exam to be a slight tinge of discomfort.  Suspect related to scar tissue given lack of other abdominal pain or symptoms.  If she starts to notice abdominal discomfort at any other time she will let us know.   Orders Placed This Encounter  Procedures  . MM 3D SCREEN BREAST BILATERAL    Standing Status:   Future    Standing Expiration Date:   11/28/2020    Order Specific Question:   Reason for Exam (SYMPTOM  OR DIAGNOSIS REQUIRED)    Answer:   berast cancer screening    Order Specific Question:   Preferred imaging location?    Answer:   [facility]  . DG Bone Density    Standing Status:   Future    Standing Expiration Date:   11/28/2020    Order Specific Question:   Reason for Exam (SYMPTOM  OR DIAGNOSIS REQUIRED)    Answer:   postmenopausal estrogen deficiency    Order Specific Question:   [facility]    [facility]  . Comp Met ([facility])  . TSH  . CBC  . HgB A1c  . Lipid panel  . B12  . Ambulatory referral to Orthopedic Surgery    Referral Priority:   Routine    Referral Type:   Surgical    Referral Reason:   Specialty Services Required    Requested Specialty:   Orthopedic Surgery    Number of Visits Requested:   1    Meds ordered this encounter  Medications  . conjugated estrogens (PREMARIN) vaginal cream    Sig: PLACE 1 APPLICATORFUL VAGINALLY 2 TIMES A WEEK    Dispense:  30 g    Refill:  1    This visit occurred during the SARS-CoV-2 public health emergency.  Safety protocols were in place, including screening questions prior to the visit, additional usage of staff PPE, and extensive cleaning of exam room while observing appropriate contact time as indicated for disinfecting solutions.    [name], MD [place] [facility]

## 2019-11-29 NOTE — Assessment & Plan Note (Signed)
Recheck B12.  Also checking A1c.  Discussed given that this is more of a persistent issue we will likely have her see neurology.

## 2019-11-29 NOTE — Assessment & Plan Note (Signed)
Encouraged diet and exercise.  

## 2019-11-30 ENCOUNTER — Encounter: Payer: Self-pay | Admitting: Family Medicine

## 2019-11-30 DIAGNOSIS — Z780 Asymptomatic menopausal state: Secondary | ICD-10-CM

## 2019-12-01 MED ORDER — PREMARIN 0.625 MG/GM VA CREA: TOPICAL_CREAM | VAGINAL | 1 refills | Status: DC

## 2019-12-12 DIAGNOSIS — C642 Malignant neoplasm of left kidney, except renal pelvis: Secondary | ICD-10-CM | POA: Diagnosis not present

## 2019-12-12 DIAGNOSIS — K8020 Calculus of gallbladder without cholecystitis without obstruction: Secondary | ICD-10-CM | POA: Diagnosis not present

## 2019-12-12 DIAGNOSIS — Z905 Acquired absence of kidney: Secondary | ICD-10-CM | POA: Diagnosis not present

## 2019-12-12 DIAGNOSIS — Z85528 Personal history of other malignant neoplasm of kidney: Secondary | ICD-10-CM | POA: Diagnosis not present

## 2019-12-12 DIAGNOSIS — M1712 Unilateral primary osteoarthritis, left knee: Secondary | ICD-10-CM | POA: Diagnosis not present

## 2019-12-12 DIAGNOSIS — R911 Solitary pulmonary nodule: Secondary | ICD-10-CM | POA: Diagnosis not present

## 2019-12-12 DIAGNOSIS — M179 Osteoarthritis of knee, unspecified: Secondary | ICD-10-CM | POA: Diagnosis not present

## 2019-12-12 DIAGNOSIS — Z96651 Presence of right artificial knee joint: Secondary | ICD-10-CM | POA: Diagnosis not present

## 2019-12-12 DIAGNOSIS — N2889 Other specified disorders of kidney and ureter: Secondary | ICD-10-CM | POA: Diagnosis not present

## 2019-12-12 DIAGNOSIS — Z08 Encounter for follow-up examination after completed treatment for malignant neoplasm: Secondary | ICD-10-CM | POA: Diagnosis not present

## 2019-12-12 DIAGNOSIS — K5730 Diverticulosis of large intestine without perforation or abscess without bleeding: Secondary | ICD-10-CM | POA: Diagnosis not present

## 2019-12-12 DIAGNOSIS — K760 Fatty (change of) liver, not elsewhere classified: Secondary | ICD-10-CM | POA: Diagnosis not present

## 2019-12-22 ENCOUNTER — Other Ambulatory Visit: Payer: Self-pay | Admitting: Family Medicine

## 2019-12-22 DIAGNOSIS — I10 Essential (primary) hypertension: Secondary | ICD-10-CM

## 2019-12-30 ENCOUNTER — Other Ambulatory Visit: Payer: Self-pay

## 2019-12-30 MED ORDER — PREGABALIN 50 MG PO CAPS: 50.0000 mg | ORAL_CAPSULE | Freq: Every evening | ORAL | 1 refills | Status: DC

## 2020-01-02 ENCOUNTER — Other Ambulatory Visit: Payer: Self-pay | Admitting: Family Medicine

## 2020-01-02 DIAGNOSIS — R202 Paresthesia of skin: Secondary | ICD-10-CM

## 2020-01-03 ENCOUNTER — Encounter: Payer: Self-pay | Admitting: Family Medicine

## 2020-01-18 ENCOUNTER — Telehealth: Payer: Self-pay | Admitting: Family Medicine

## 2020-01-18 NOTE — Telephone Encounter (Signed)
Rejection Reason - Patient did not respond" [employer] - Neurology said on Jan 18, 2020 11:51 AM

## 2020-01-26 ENCOUNTER — Telehealth: Payer: Self-pay

## 2020-01-26 DIAGNOSIS — Z87891 Personal history of nicotine dependence: Secondary | ICD-10-CM

## 2020-01-26 DIAGNOSIS — Z122 Encounter for screening for malignant neoplasm of respiratory organs: Secondary | ICD-10-CM

## 2020-01-26 NOTE — Telephone Encounter (Signed)
Attempted to contact patient at both home and cell phone numbers to schedule lung screening scan but unable to reach her. Left message on cell phone to return call back to schedule CT scan. Phone number to return call was provided in message.

## 2020-01-30 ENCOUNTER — Encounter: Payer: Self-pay | Admitting: Neurology

## 2020-02-02 NOTE — Addendum Note (Signed)
Addended by: Lieutenant Diego on: 02/02/2020 11:35 AM   Modules accepted: Orders

## 2020-02-02 NOTE — Telephone Encounter (Signed)
Contacted and scheduled 

## 2020-02-05 DIAGNOSIS — H903 Sensorineural hearing loss, bilateral: Secondary | ICD-10-CM | POA: Diagnosis not present

## 2020-02-08 ENCOUNTER — Other Ambulatory Visit: Payer: Self-pay

## 2020-02-08 ENCOUNTER — Ambulatory Visit
Admission: RE | Admit: 2020-02-08 | Discharge: 2020-02-08 | Disposition: A | Discharge status: Home / Self Care | Payer: Medicare HMO | Source: Ambulatory Visit | Attending: Nurse Practitioner | Admitting: Nurse Practitioner

## 2020-02-08 DIAGNOSIS — Z122 Encounter for screening for malignant neoplasm of respiratory organs: Secondary | ICD-10-CM | POA: Insufficient documentation

## 2020-02-08 DIAGNOSIS — Z87891 Personal history of nicotine dependence: Secondary | ICD-10-CM | POA: Diagnosis not present

## 2020-02-10 ENCOUNTER — Encounter: Payer: Self-pay | Admitting: *Deleted

## 2020-02-12 ENCOUNTER — Other Ambulatory Visit: Payer: Self-pay

## 2020-02-12 ENCOUNTER — Ambulatory Visit
Admission: RE | Admit: 2020-02-12 | Discharge: 2020-02-12 | Disposition: A | Discharge status: Home / Self Care | Payer: Medicare HMO | Source: Ambulatory Visit | Attending: Family Medicine | Admitting: Family Medicine

## 2020-02-12 DIAGNOSIS — Z780 Asymptomatic menopausal state: Secondary | ICD-10-CM | POA: Diagnosis not present

## 2020-02-12 DIAGNOSIS — Z90722 Acquired absence of ovaries, bilateral: Secondary | ICD-10-CM | POA: Diagnosis not present

## 2020-02-12 DIAGNOSIS — M85852 Other specified disorders of bone density and structure, left thigh: Secondary | ICD-10-CM | POA: Diagnosis not present

## 2020-02-12 DIAGNOSIS — Z1231 Encounter for screening mammogram for malignant neoplasm of breast: Secondary | ICD-10-CM | POA: Diagnosis not present

## 2020-02-12 DIAGNOSIS — Z87828 Personal history of other (healed) physical injury and trauma: Secondary | ICD-10-CM | POA: Diagnosis not present

## 2020-02-23 ENCOUNTER — Telehealth (INDEPENDENT_AMBULATORY_CARE_PROVIDER_SITE_OTHER): Payer: Medicare HMO | Admitting: Family Medicine

## 2020-02-23 DIAGNOSIS — R300 Dysuria: Secondary | ICD-10-CM | POA: Diagnosis not present

## 2020-02-23 NOTE — Telephone Encounter (Signed)
She can come in for UA with micro and urine culture

## 2020-02-23 NOTE — Telephone Encounter (Signed)
Pt wanted to know if she could come in today and do a Urine  She thinks she has a UTI. She is having lower back pain  Pt is scheduled for 11/22 with Maudie Mercury

## 2020-02-23 NOTE — Telephone Encounter (Signed)
Called and spoke to Amber Perez. Amber Perez states that her urine has a fishy odor x1-2weeks. She is experiencing urinary frequency and lower back pain.

## 2020-02-23 NOTE — Addendum Note (Signed)
Addended by: Sherrilee Gilles B on: 02/23/2020 02:31 PM   Modules accepted: Orders

## 2020-02-23 NOTE — Telephone Encounter (Signed)
LMTCB to schedule lab appt for this afternoon if patient still wants to come in. Orders are in.

## 2020-02-26 ENCOUNTER — Ambulatory Visit (INDEPENDENT_AMBULATORY_CARE_PROVIDER_SITE_OTHER): Payer: Medicare HMO | Admitting: Nurse Practitioner

## 2020-02-26 ENCOUNTER — Other Ambulatory Visit: Payer: Self-pay

## 2020-02-26 ENCOUNTER — Encounter: Payer: Self-pay | Admitting: Nurse Practitioner

## 2020-02-26 VITALS — BP 140/90 | HR 90 | Temp 97.9°F | Ht 59.5 in | Wt 195.0 lb

## 2020-02-26 DIAGNOSIS — R300 Dysuria: Secondary | ICD-10-CM

## 2020-02-26 DIAGNOSIS — Z23 Encounter for immunization: Secondary | ICD-10-CM

## 2020-02-26 DIAGNOSIS — R350 Frequency of micturition: Secondary | ICD-10-CM | POA: Diagnosis not present

## 2020-02-26 LAB — POCT URINALYSIS DIPSTICK
Bilirubin, UA: NEGATIVE
Blood, UA: NEGATIVE
Glucose, UA: NEGATIVE
Nitrite, UA: POSITIVE
Protein, UA: NEGATIVE
Spec Grav, UA: 1.015 (ref 1.010–1.025)
Urobilinogen, UA: 2 E.U./dL — AB
pH, UA: 6 (ref 5.0–8.0)

## 2020-02-26 LAB — URINALYSIS, MICROSCOPIC ONLY
RBC / HPF: NONE SEEN (ref 0–?)
WBC, UA: NONE SEEN (ref 0–?)

## 2020-02-26 NOTE — Progress Notes (Signed)
Established Patient Office Visit  Subjective:  Patient ID: Amber Perez, female    DOB: March 26, 1946  Age: 74 y.o. MRN: [id number]  CC:  Chief Complaint  Patient presents with  . Acute Visit    UTI    HPI Amber Perez is a 74 year old patient with history of hypertension, GERD, overactive bladder, cancer of the kidney with partial left nephrectomy, atrophic vaginitis presents for urinary frequency on and off over the last month.  She also had lower back pain the other day when she was bending over loading the dishwasher.  The back pain was lumbar area it resolved when she straightened up.  She has had no dysuria with burning, urgency, hematuria, or flank pain.  No fevers or chills.  She feels well.  Month ago, she had a pH slight pink slight vaginal discharge.  She has no vaginal concerns at this time.    Past Medical History:  Diagnosis Date  . Cancer of left kidney ([place])   . Closed left arm fracture    [facility]   . COPD (chronic obstructive pulmonary disease) ([place])   . Dyslipidemia   . GERD (gastroesophageal reflux disease)   . Hypertension 2008  . Osteoarthritis   . Osteoporosis   . Pneumonia 2008  . Pre-diabetes   . Thyroid nodule   . Vitamin D deficiency     Past Surgical History:  Procedure Laterality Date  . ABDOMINAL HYSTERECTOMY  1995  . BILATERAL SALPINGOOPHORECTOMY  1995  . BREAST BIOPSY Left    neg  . BREAST SURGERY     Benign per pt's report  . CATARACT EXTRACTION  2009  . CATARACT EXTRACTION    . CESAREAN SECTION  [id number]  . COLONOSCOPY  2007  . COLONOSCOPY WITH PROPOFOL N/A 08/18/2019   Procedure: COLONOSCOPY WITH PROPOFOL;  Surgeon: Robert Bellow, MD;  Location: [facility];  Service: Endoscopy;  Laterality: N/A;  . GANGLION CYST EXCISION    . KNEE ARTHROPLASTY Right 06/29/2016   Procedure: COMPUTER ASSISTED TOTAL KNEE ARTHROPLASTY;  Surgeon: [name], MD;  Location: [facility] ORS;  Service: Orthopedics;  Laterality:  Right;  . KNEE ARTHROSCOPY W/ MENISCAL REPAIR Right 04/2012   [name]  . PARTIAL NEPHRECTOMY Left   . UTERINE FIBROID SURGERY     x 5    Family History  Problem Relation Age of Onset  . Anemia Mother        aplastic  . Aplastic anemia Mother   . Throat cancer Father   . Hypertension Father   . Alcohol abuse Father   . COPD Father   . Diabetes Sister   . Esophageal cancer Brother   . Colon polyps Sister   . Diabetes Brother   . Multiple myeloma Brother   . Diabetes Brother   . Colon cancer Maternal Aunt   . Breast cancer Neg Hx     Social History   Socioeconomic History  . Marital status: Divorced    Spouse name: Not on file  . Number of children: 2  . Years of education: Not on file  . Highest education level: Not on file  Occupational History  . 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 Smoking status: Former Smoker    Packs/day: 2.00    Years: 18.50    Pack years: 37.00    Types: Cigarettes    Quit date: 03/18/2006    Years since quitting: 13.9  . Smokeless tobacco: Never Used  Vaping Use  . Vaping Use: Never used  Substance and Sexual Activity  . Alcohol use: Yes    Alcohol/week: 0.0 - 2.0 standard drinks    Comment: Occasional glass of wine, sometimes 2 in a week, sometimes 1 every couple of months.  . Drug use: No  . Sexual activity: Never  Other Topics Concern  . Not on file  Social History Narrative   Regular Exercise -  Occasional   Daily Caffeine Use:  2 cups coffee & Diet Mt Dew   Social Determinants of Health   Financial Resource Strain: Low Risk   . Difficulty of Paying Living Expenses: Not hard at all  Food Insecurity: No Food Insecurity  . Worried About Charity fundraiser in the Last Year: Never true  . Ran Out of Food in the Last Year: Never true  Transportation Needs: No Transportation Needs  . Lack of Transportation (Medical): No  . Lack of Transportation (Non-Medical): No  Physical Activity:   . Days of  Exercise per Week: Not on file  . Minutes of Exercise per Session: Not on file  Stress: No Stress Concern Present  . Feeling of Stress : Not at all  Social Connections: Unknown  . Frequency of Communication with Friends and Family: More than three times a week  . Frequency of Social Gatherings with Friends and Family: More than three times a week  . Attends Religious Services: Not on file  . Active Member of Clubs or Organizations: Not on file  . Attends Archivist Meetings: Not on file  . Marital Status: Not on file  Intimate Partner Violence: Not At Risk  . Fear of Current or Ex-Partner: No  . Emotionally Abused: No  . Physically Abused: No  . Sexually Abused: No    Outpatient Medications Prior to Visit  Medication Sig Dispense Refill  . amLODipine (NORVASC) 5 MG tablet TAKE 1 TABLET BY MOUTH EVERY DAY 30 tablet 1  . Calcium-Magnesium (CAL-MAG PO) Take 2 tablets by mouth at bedtime. Takes occasionally    . Cholecalciferol (VITAMIN D3 PO) Take 2,500 Units by mouth at bedtime. Takes when remembers    . conjugated estrogens (PREMARIN) vaginal cream PLACE 1 APPLICATORFUL VAGINALLY 2 TIMES A WEEK 30 g 1  . Cyanocobalamin (VITAMIN B-12 PO) Take by mouth.    . pantoprazole (PROTONIX) 40 MG tablet TAKE 1 TABLET BY MOUTH EVERY DAY 90 tablet 1  . pregabalin (LYRICA) 50 MG capsule Take 1 capsule (50 mg total) by mouth at bedtime. Take 1-3 hours prior to bed. 30 capsule 1  . triamcinolone (NASACORT AQ) 55 MCG/ACT AERO nasal inhaler Place 2 sprays into the nose as needed.    . cetirizine (ZYRTEC) 10 MG tablet Take 1 tablet (10 mg total) by mouth daily. (Patient taking differently: Take 10 mg by mouth daily as needed (for allergies.). ) 90 tablet 4  . doxycycline (VIBRA-TABS) 100 MG tablet Take 1 tablet (100 mg total) by mouth 2 (two) times daily. 14 tablet 0  . TURMERIC PO Take 200 mg by mouth at bedtime. Takes when remembers     No facility-administered medications prior to visit.     Allergies  Allergen Reactions  . Penicillins Hives and Rash    Also drop in BP Has patient had a PCN reaction causing immediate rash, facial/tongue/throat swelling, SOB or lightheadedness with hypotension:Yes Has patient had a PCN reaction causing severe rash involving mucus membranes or skin necrosis:No Has patient had a PCN reaction  that required hospitalization:No Has patient had a PCN reaction occurring within the last 10 years:No If all of the above answers are "NO", then may proceed with Cephalosporin use.   [name] Bisphosphonates     Severe GI upset, damaged esophagus.    Review of Systems Pertinent positives noted in history of present illness and otherwise negative.   Objective:    Physical Exam Vitals reviewed.  Constitutional:      Appearance: She is normal weight.  Cardiovascular:     Rate and Rhythm: Normal rate and regular rhythm.     Pulses: Normal pulses.     Heart sounds: Normal heart sounds.  Pulmonary:     Effort: Pulmonary effort is normal.     Breath sounds: Normal breath sounds.  Abdominal:     Palpations: Abdomen is soft.     Tenderness: There is no abdominal tenderness. There is no right CVA tenderness or left CVA tenderness.  Musculoskeletal:        General: Normal range of motion.     Comments: Slight lumbar tenderness over spine.   Neurological:     General: No focal deficit present.     Mental Status: She is alert and oriented to person, place, and time.  Psychiatric:        Behavior: Behavior normal.     BP 140/90 (BP Location: Left Arm, Patient Position: Sitting, Cuff Size: Normal)   Pulse 90   Temp 97.9 F (36.6 C) (Oral)   Ht 4' 11.5" (1.511 m)   Wt 195 lb (88.5 kg)   SpO2 97%   BMI 38.73 kg/m  Wt Readings from Last 3 Encounters:  02/26/20 195 lb (88.5 kg)  02/08/20 187 lb (84.8 kg)  11/29/19 194 lb (88 kg)   Pulse Readings from Last 3 Encounters:  02/26/20 90  11/29/19 85  08/18/19 82    BP Readings from Last 3  Encounters:  02/26/20 140/90  11/29/19 116/80  08/18/19 118/73    Lab Results  Component Value Date   CHOL 228 (H) 11/29/2019   HDL 54.60 11/29/2019   LDLCALC 135 (H) 11/29/2019   LDLDIRECT 127.1 11/25/2011   TRIG 191.0 (H) 11/29/2019   CHOLHDL 4 11/29/2019      There are no preventive care reminders to display for this patient.  Lab Results  Component Value Date   TSH 2.35 11/29/2019   Lab Results  Component Value Date   WBC 6.3 11/29/2019   HGB 12.6 11/29/2019   HCT 38.7 11/29/2019   MCV 90.8 11/29/2019   PLT 236.0 11/29/2019   Lab Results  Component Value Date   NA 138 11/29/2019   K 4.0 11/29/2019   CO2 27 11/29/2019   GLUCOSE 109 (H) 11/29/2019   BUN 16 11/29/2019   CREATININE 0.76 11/29/2019   BILITOT 0.4 11/29/2019   ALKPHOS 52 11/29/2019   AST 30 11/29/2019   ALT 39 (H) 11/29/2019   PROT 7.0 11/29/2019   ALBUMIN 4.2 11/29/2019   CALCIUM 9.3 11/29/2019   ANIONGAP 8 04/04/2019   GFR 74.65 11/29/2019   Lab Results  Component Value Date   CHOL 228 (H) 11/29/2019   Lab Results  Component Value Date   HDL 54.60 11/29/2019   Lab Results  Component Value Date   LDLCALC 135 (H) 11/29/2019   Lab Results  Component Value Date   TRIG 191.0 (H) 11/29/2019   Lab Results  Component Value Date   CHOLHDL 4 11/29/2019   Lab Results  Component Value  Date   HGBA1C 6.2 11/29/2019      Assessment & Plan:   Problem List Items Addressed This Visit      Other   Urinary frequency - Primary   Dysuria    Other Visit Diagnoses    Need for immunization against influenza       Relevant Orders   Flu Vaccine QUAD High Dose(Fluad) (Completed)     Will await UA with urine culture results.  Patient is not certain that she has a UTI.  She did have one a few years ago and was very symptomatic and reports she has nothing like that going on now.  We will call her with results.  In the meantime, she is to hydrate well with water, cranberry juice may be helpful.   Patient advised to avoid Coke, Pepsi, dark colas, teas and coffee since these are bladder irritants.  If the  urine culture is negative, would recommend she follow-up with Dr. Caryl Bis as she has had a hysterectomy.  Contact a health care provider if:  You have a fever.  You develop pain in your back or sides.  You have nausea or vomiting.  You have blood in your urine.  You are not urinating as often as you usually do. Get help right away if:  Your pain is severe and not relieved with medicines.  You cannot eat or drink without vomiting.  You are confused.  You have a rapid heartbeat while at rest.  You have shaking or chills.  You feel extremely weak.  Follow-up: Return if symptoms worsen or fail to improve.  This visit occurred during the SARS-CoV-2 public health emergency.  Safety protocols were in place, including screening questions prior to the visit, additional usage of staff PPE, and extensive cleaning of exam room while observing appropriate contact time as indicated for disinfecting solutions.    Denice Paradise, NP

## 2020-02-26 NOTE — Patient Instructions (Addendum)
Await urine culture results. We will call you with results. Hydrate- water and cranberry juice may be helpful.   Please avoid Coke- Pepsi- - dark colas, tea coffee as these can be bladder irritants. Contact a health care provider if:  You have a fever.  You develop pain in your back or sides.  You have nausea or vomiting.  You have blood in your urine.  You are not urinating as often as you usually do. Get help right away if:  Your pain is severe and not relieved with medicines.  You cannot eat or drink without vomiting.  You are confused.  You have a rapid heartbeat while at rest.  You have shaking or chills.  You feel extremely weak.   Urinary Frequency, Adult Urinary frequency means urinating more often than usual. You may urinate every 1-2 hours even though you drink a normal amount of fluid and do not have a bladder infection or condition. Although you urinate more often than normal, the total amount of urine produced in a day is normal. With urinary frequency, you may have an urgent need to urinate often. The stress and anxiety of needing to find a bathroom quickly can make this urge worse. This condition may go away on its own or you may need treatment at home. Home treatment may include bladder training, exercises, taking medicines, or making changes to your diet. Follow these instructions at home: Bladder health   Keep a bladder diary if told by your health care provider. Keep track of: ? What you eat and drink. ? How often you urinate. ? How much you urinate.  Follow a bladder training program if told by your health care provider. This may include: ? Learning to delay going to the bathroom. ? Double urinating (voiding). This helps if you are not completely emptying your bladder. ? Scheduled voiding.  Do Kegel exercises as told by your health care provider. Kegel exercises strengthen the muscles that help control urination, which may help the condition. Eating  and drinking  If told by your health care provider, make diet changes, such as: ? Avoiding caffeine. ? Drinking fewer fluids, especially alcohol. ? Not drinking in the evening. ? Avoiding foods or drinks that may irritate the bladder. These include coffee, tea, soda, artificial sweeteners, citrus, tomato-based foods, and chocolate. ? Eating foods that help prevent or ease constipation. Constipation can make this condition worse. Your health care provider may recommend that you:  Drink enough fluid to keep your urine pale yellow.  Take over-the-counter or prescription medicines.  Eat foods that are high in fiber, such as beans, whole grains, and fresh fruits and vegetables.  Limit foods that are high in fat and processed sugars, such as fried or sweet foods. General instructions  Take over-the-counter and prescription medicines only as told by your health care provider.  Keep all follow-up visits as told by your health care provider. This is important. Contact a health care provider if:  You start urinating more often.  You feel pain or irritation when you urinate.  You notice blood in your urine.  Your urine looks cloudy.  You develop a fever.  You begin vomiting. Get help right away if:  You are unable to urinate. Summary  Urinary frequency means urinating more often than usual. With urinary frequency, you may urinate every 1-2 hours even though you drink a normal amount of fluid and do not have a bladder infection or other bladder condition.  Your health care provider  may recommend that you keep a bladder diary, follow a bladder training program, or make dietary changes.  If told by your health care provider, do Kegel exercises to strengthen the muscles that help control urination.  Take over-the-counter and prescription medicines only as told by your health care provider.  Contact a health care provider if your symptoms do not improve or get worse. This information  is not intended to replace advice given to you by your health care provider. Make sure you discuss any questions you have with your health care provider. Document Revised: 09/30/2017 Document Reviewed: 09/30/2017 Elsevier Patient Education  [place].  Dysuria Dysuria is pain or discomfort while urinating. The pain or discomfort may be felt in the part of your body that drains urine from the bladder (urethra) or in the surrounding tissue of the genitals. The pain may also be felt in the groin area, lower abdomen, or lower back. You may have to urinate frequently or have the sudden feeling that you have to urinate (urgency). Dysuria can affect both men and women, but it is more common in women. Dysuria can be caused by many different things, including:  Urinary tract infection.  Kidney stones or bladder stones.  Certain sexually transmitted infections (STIs), such as chlamydia.  Dehydration.  Inflammation of the tissues of the vagina.  Use of certain medicines.  Use of certain soaps or scented products that cause irritation. Follow these instructions at home: General instructions  Watch your condition for any changes.  Urinate often. Avoid holding urine for long periods of time.  After a bowel movement or urination, women should cleanse from front to back, using each tissue only once.  Urinate after sexual intercourse.  Keep all follow-up visits as told by your health care provider. This is important.  If you had any tests done to find the cause of dysuria, it is up to you to get your test results. Ask your health care provider, or the department that is doing the test, when your results will be ready. Eating and drinking   Drink enough fluid to keep your urine pale yellow.  Avoid caffeine, tea, and alcohol. They can irritate the bladder and make dysuria worse. In men, alcohol may irritate the prostate. Medicines  Take over-the-counter and prescription medicines  only as told by your health care provider.  If you were prescribed an antibiotic medicine, take it as told by your health care provider. Do not stop taking the antibiotic even if you start to feel better. Contact a health care provider if:  You have a fever.  You develop pain in your back or sides.  You have nausea or vomiting.  You have blood in your urine.  You are not urinating as often as you usually do. Get help right away if:  Your pain is severe and not relieved with medicines.  You cannot eat or drink without vomiting.  You are confused.  You have a rapid heartbeat while at rest.  You have shaking or chills.  You feel extremely weak. Summary  Dysuria is pain or discomfort while urinating. Many different conditions can lead to dysuria.  If you have dysuria, you may have to urinate frequently or have the sudden feeling that you have to urinate (urgency).  Watch your condition for any changes. Keep all follow-up visits as told by your health care provider.  Make sure that you urinate often and drink enough fluid to keep your urine pale yellow. This information is  not intended to replace advice given to you by your health care provider. Make sure you discuss any questions you have with your health care provider. Document Revised: 03/05/2017 Document Reviewed: 01/07/2017 Elsevier Patient Education  [place].

## 2020-02-26 NOTE — Telephone Encounter (Signed)
Patient was seen in office today.  

## 2020-02-27 ENCOUNTER — Other Ambulatory Visit: Payer: Self-pay | Admitting: Family Medicine

## 2020-02-27 DIAGNOSIS — I10 Essential (primary) hypertension: Secondary | ICD-10-CM

## 2020-02-27 LAB — URINE CULTURE
MICRO NUMBER:: 11233103
Result:: NO GROWTH
SPECIMEN QUALITY:: ADEQUATE

## 2020-03-11 ENCOUNTER — Telehealth: Payer: Self-pay

## 2020-03-11 NOTE — Telephone Encounter (Signed)
-----   Message from Leone Haven, MD sent at 02/21/2020  7:08 PM EST ----- How much vitamin D is she currently taking? That will not likely make enough difference. Would she be willing to take any other medications for osteoporosis? If needed we could schedule her for a visit to discuss options.

## 2020-03-18 DIAGNOSIS — H903 Sensorineural hearing loss, bilateral: Secondary | ICD-10-CM | POA: Diagnosis not present

## 2020-03-25 ENCOUNTER — Other Ambulatory Visit: Payer: Self-pay | Admitting: Family Medicine

## 2020-03-25 DIAGNOSIS — I10 Essential (primary) hypertension: Secondary | ICD-10-CM

## 2020-04-01 ENCOUNTER — Other Ambulatory Visit: Payer: Self-pay | Admitting: Family Medicine

## 2020-04-01 DIAGNOSIS — Z780 Asymptomatic menopausal state: Secondary | ICD-10-CM

## 2020-04-25 ENCOUNTER — Other Ambulatory Visit: Payer: Self-pay | Admitting: Family Medicine

## 2020-04-25 DIAGNOSIS — I10 Essential (primary) hypertension: Secondary | ICD-10-CM

## 2020-05-03 ENCOUNTER — Encounter: Payer: Self-pay | Admitting: Neurology

## 2020-05-03 ENCOUNTER — Other Ambulatory Visit: Payer: Self-pay

## 2020-05-03 ENCOUNTER — Ambulatory Visit: Payer: Medicare Other | Admitting: Neurology

## 2020-05-03 VITALS — BP 136/74 | HR 98 | Ht 59.0 in | Wt 197.0 lb

## 2020-05-03 DIAGNOSIS — R202 Paresthesia of skin: Secondary | ICD-10-CM | POA: Diagnosis not present

## 2020-05-03 DIAGNOSIS — G5622 Lesion of ulnar nerve, left upper limb: Secondary | ICD-10-CM

## 2020-05-03 NOTE — Progress Notes (Signed)
[facility] Clinic Note - Initial Visit   Date: 05/03/20  Amber Perez MRN: [id number] DOB: 09-Jun-1945   Dear Dr. Caryl Bis:  Thank you for your kind referral of Amber Perez for consultation of left hand tingling. Although her history is well known to you, please allow [place] to reiterate it for the purpose of our medical record. The patient was accompanied to the clinic by self.   History of Present Illness: Amber Perez is a 75 y.o. left-handed female with restless leg syndrome and hypertension presenting for evaluation of left hand tingling.  For the past year, she has noticed left hand tingling, involving all the fingers which occurs at upon awakening.  She has noticed that it occurs when her left arm is flexed.  After repositioning, symptoms resolve within a few minutes.  She has some weakness in her grip. No neck pain. It occurs 1-2 times per week.  No similar symptoms on the right hand.  She has been retired for 10 years and previously used to work at a [employer].    Out-side paper records, electronic medical record, and images have been reviewed where available and summarized as:  Lab Results  Component Value Date   HGBA1C 6.2 11/29/2019   Lab Results  Component Value Date   VITAMINB12 718 11/29/2019   Lab Results  Component Value Date   TSH 2.35 11/29/2019   Lab Results  Component Value Date   ESRSEDRATE 7 06/17/2016    Past Medical History:  Diagnosis Date  . Cancer of left kidney ([place])   . Closed left arm fracture    [facility]   . COPD (chronic obstructive pulmonary disease) ([place])   . Dyslipidemia   . GERD (gastroesophageal reflux disease)   . Hypertension 2008  . Osteoarthritis   . Osteoporosis   . Pneumonia 2008  . Pre-diabetes   . Thyroid nodule   . Vitamin D deficiency     Past Surgical History:  Procedure Laterality Date  . ABDOMINAL HYSTERECTOMY  1995  . BILATERAL SALPINGOOPHORECTOMY  1995   . BREAST BIOPSY Left    neg  . BREAST SURGERY     Benign per pt's report  . CATARACT EXTRACTION  2009  . CATARACT EXTRACTION    . CESAREAN SECTION  [id number]  . COLONOSCOPY  2007  . COLONOSCOPY WITH PROPOFOL N/A 08/18/2019   Procedure: COLONOSCOPY WITH PROPOFOL;  Surgeon: Robert Bellow, MD;  Location: [facility] END[facility]COPY;  Service: Endoscopy;  Laterality: N/A;  . GANGLION CYST EXCISION    . KNEE ARTHROPLASTY Right 06/29/2016   Procedure: COMPUTER ASSISTED TOTAL KNEE ARTHROPLASTY;  Surgeon: [name], MD;  Location: [facility] ORS;  Service: Orthopedics;  Laterality: Right;  . KNEE ARTHROSCOPY W/ MENISCAL REPAIR Right 04/2012   [name]  . PARTIAL NEPHRECTOMY Left   . UTERINE FIBROID SURGERY     x 5     Medications:  Outpatient Encounter Medications as of 05/03/2020  Medication Sig  . amLODipine (NORVASC) 5 MG tablet TAKE 1 TABLET BY MOUTH EVERY DAY  . Calcium-Magnesium (CAL-MAG PO) Take 2 tablets by mouth at bedtime. Takes occasionally  . Cholecalciferol (VITAMIN D3 PO) Take 2,500 Units by mouth at bedtime. Takes when remembers  . Cyanocobalamin (VITAMIN B-12 PO) Take by mouth.  . pantoprazole (PROTONIX) 40 MG tablet TAKE 1 TABLET BY MOUTH EVERY DAY  . pregabalin (LYRICA) 50 MG capsule Take 1 capsule (50 mg total) by mouth at bedtime. Take 1-3 hours prior  to bed.  [name] PREMARIN vaginal cream PLACE 1 APPLICATORFUL VAGINALLY 2 TIMES A WEEK  . triamcinolone (NASACORT) 55 MCG/ACT AERO nasal inhaler Place 2 sprays into the nose as needed.   No facility-administered encounter medications on file as of 05/03/2020.    Allergies:  Allergies  Allergen Reactions  . Penicillins Hives and Rash    Also drop in BP Has patient had a PCN reaction causing immediate rash, facial/tongue/throat swelling, SOB or lightheadedness with hypotension:Yes Has patient had a PCN reaction causing severe rash involving mucus membranes or skin necrosis:No Has patient had a PCN reaction that required  hospitalization:No Has patient had a PCN reaction occurring within the last 10 years:No If all of the above answers are "NO", then may proceed with Cephalosporin use.   [name] Bisphosphonates     Severe GI upset, damaged esophagus.    Family History: Family History  Problem Relation Age of Onset  . Anemia Mother        aplastic  . Aplastic anemia Mother   . Throat cancer Father   . Hypertension Father   . Alcohol abuse Father   . COPD Father   . Diabetes Sister   . Esophageal cancer Brother   . Colon polyps Sister   . Diabetes Brother   . Multiple myeloma Brother   . Diabetes Brother   . Colon cancer Maternal Aunt   . Breast cancer Neg Hx     Social History: Social History   Tobacco Use  . Smoking status: Former Smoker    Packs/day: 2.00    Years: 18.50    Pack years: 37.00    Types: Cigarettes    Quit date: 03/18/2006    Years since quitting: 14.1  . Smokeless tobacco: Never Used  Vaping Use  . Vaping Use: Never used  Substance Use Topics  . Alcohol use: Yes    Alcohol/week: 0.0 - 2.0 standard drinks    Comment: Occasional glass of wine, sometimes 2 in a week, sometimes 1 every couple of months.  . Drug use: No   Social History   Social History Narrative   Regular Exercise -  Occasional   Daily Caffeine Use:  2 cups coffee & Diet Mt Dew   Left Handed   Lives in one story home    Vital Signs:  BP 136/74   Pulse 98   Ht _0  (1.499 m)   Wt 197 lb (89.4 kg)   SpO2 93%   BMI 39.79 kg/m   Neurological Exam: MENTAL STATUS including orientation to time, place, person, recent and remote memory, attention span and concentration, language, and fund of knowledge is normal.  Speech is not dysarthric.  CRANIAL NERVES: II:  No visual field defects.  III-IV-VI: Pupils equal round and reactive to light.  Normal conjugate, extra-ocular eye movements in all directions of gaze.  No nystagmus.  No ptosis.   V:  Normal facial sensation.    VII:  Normal facial  symmetry and movements.   VIII:  Normal hearing and vestibular function.   IX-X:  Normal palatal movement.   XI:  Normal shoulder shrug and head rotation.   XII:  Normal tongue strength and range of motion, no deviation or fasciculation.  MOTOR:  No atrophy, fasciculations or abnormal movements.  No pronator drift.   Upper Extremity:  Right  Left  Deltoid  5/5   5/5   Biceps  5/5   5/5   Triceps  5/5   5/5  Infraspinatus 5/5  5/5  Medial pectoralis 5/5  5/5  Wrist extensors  5/5   5/5   Wrist flexors  5/5   5/5   Finger extensors  5/5   5/5   Finger flexors  5/5   5/5   Dorsal interossei  5/5   5/5   Abductor pollicis  5/5   5/5   Tone (Ashworth scale)  0  0   Lower Extremity:  Right  Left  Hip flexors  5/5   5/5   Hip extensors  5/5   5/5   Adductor 5/5  5/5  Abductor 5/5  5/5  Knee flexors  5/5   5/5   Knee extensors  5/5   5/5   Dorsiflexors  5/5   5/5   Plantarflexors  5/5   5/5   Toe extensors  5/5   5/5   Toe flexors  5/5   5/5   Tone (Ashworth scale)  0  0   MSRs:  Right        Left                  brachioradialis 2+  2+  biceps 2+  2+  triceps 2+  2+  patellar 2+  2+  ankle jerk 2+  2+  Hoffman no  no  plantar response down  down   SENSORY:  Normal and symmetric perception of light touch, pinprick, vibration, and proprioception.  Tinel's sign is negative at the wrists and elbows bilaterally.   COORDINATION/GAIT: Normal finger-to- nose-finger.  Intact rapid alternating movements bilaterally.    Gait narrow based and stable.   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   - Conservative therapies to avoid compression and over stretching of the nerve discussed  - NCS/EMG declined at this time.    - She will contact the office if symptoms get worse to schedule testing    Thank you for allowing me to participate in patient's care.  If I can answer any additional questions, I would be  pleased to do so.    Sincerely,    Donika K. Posey Pronto, DO

## 2020-05-03 NOTE — Patient Instructions (Signed)
Try to avoid over-bending at the wrist and elbow.   Sleep with your arms on your side or torso If symptoms get worse, please come back and see me and we can discuss doing nerve testing.

## 2020-05-24 ENCOUNTER — Other Ambulatory Visit: Payer: Self-pay | Admitting: Family Medicine

## 2020-05-24 DIAGNOSIS — I10 Essential (primary) hypertension: Secondary | ICD-10-CM

## 2020-05-27 ENCOUNTER — Encounter: Payer: Self-pay | Admitting: Family Medicine

## 2020-06-05 ENCOUNTER — Ambulatory Visit: Payer: Medicare HMO | Admitting: Family Medicine

## 2020-06-12 DIAGNOSIS — L570 Actinic keratosis: Secondary | ICD-10-CM | POA: Diagnosis not present

## 2020-06-12 DIAGNOSIS — Z872 Personal history of diseases of the skin and subcutaneous tissue: Secondary | ICD-10-CM | POA: Diagnosis not present

## 2020-06-12 DIAGNOSIS — L821 Other seborrheic keratosis: Secondary | ICD-10-CM | POA: Diagnosis not present

## 2020-06-12 DIAGNOSIS — L578 Other skin changes due to chronic exposure to nonionizing radiation: Secondary | ICD-10-CM | POA: Diagnosis not present

## 2020-06-21 ENCOUNTER — Other Ambulatory Visit: Payer: Self-pay | Admitting: Family Medicine

## 2020-06-21 DIAGNOSIS — I10 Essential (primary) hypertension: Secondary | ICD-10-CM

## 2020-06-27 DIAGNOSIS — K8020 Calculus of gallbladder without cholecystitis without obstruction: Secondary | ICD-10-CM | POA: Diagnosis not present

## 2020-06-27 DIAGNOSIS — C642 Malignant neoplasm of left kidney, except renal pelvis: Secondary | ICD-10-CM | POA: Diagnosis not present

## 2020-06-27 DIAGNOSIS — R911 Solitary pulmonary nodule: Secondary | ICD-10-CM | POA: Diagnosis not present

## 2020-06-27 DIAGNOSIS — Z08 Encounter for follow-up examination after completed treatment for malignant neoplasm: Secondary | ICD-10-CM | POA: Diagnosis not present

## 2020-06-27 DIAGNOSIS — K5730 Diverticulosis of large intestine without perforation or abscess without bleeding: Secondary | ICD-10-CM | POA: Diagnosis not present

## 2020-06-27 DIAGNOSIS — Z87440 Personal history of urinary (tract) infections: Secondary | ICD-10-CM | POA: Diagnosis not present

## 2020-06-27 DIAGNOSIS — Z85528 Personal history of other malignant neoplasm of kidney: Secondary | ICD-10-CM | POA: Diagnosis not present

## 2020-06-27 DIAGNOSIS — Z905 Acquired absence of kidney: Secondary | ICD-10-CM | POA: Diagnosis not present

## 2020-07-19 ENCOUNTER — Other Ambulatory Visit: Payer: Self-pay | Admitting: Family Medicine

## 2020-07-19 DIAGNOSIS — I10 Essential (primary) hypertension: Secondary | ICD-10-CM

## 2020-08-02 ENCOUNTER — Other Ambulatory Visit: Payer: Self-pay | Admitting: Family Medicine

## 2020-08-02 DIAGNOSIS — I10 Essential (primary) hypertension: Secondary | ICD-10-CM

## 2020-10-08 ENCOUNTER — Other Ambulatory Visit: Payer: Self-pay | Admitting: Family Medicine

## 2020-10-08 DIAGNOSIS — Z780 Asymptomatic menopausal state: Secondary | ICD-10-CM

## 2020-11-12 DIAGNOSIS — H26499 Other secondary cataract, unspecified eye: Secondary | ICD-10-CM | POA: Diagnosis not present

## 2020-11-12 DIAGNOSIS — H04129 Dry eye syndrome of unspecified lacrimal gland: Secondary | ICD-10-CM | POA: Diagnosis not present

## 2020-11-12 DIAGNOSIS — H1045 Other chronic allergic conjunctivitis: Secondary | ICD-10-CM | POA: Diagnosis not present

## 2020-11-12 DIAGNOSIS — Z961 Presence of intraocular lens: Secondary | ICD-10-CM | POA: Diagnosis not present

## 2020-11-14 ENCOUNTER — Ambulatory Visit (INDEPENDENT_AMBULATORY_CARE_PROVIDER_SITE_OTHER): Payer: Medicare Other

## 2020-11-14 VITALS — Ht 59.0 in | Wt 197.0 lb

## 2020-11-14 DIAGNOSIS — Z0000 Encounter for general adult medical examination without abnormal findings: Secondary | ICD-10-CM | POA: Diagnosis not present

## 2020-11-14 DIAGNOSIS — Z1231 Encounter for screening mammogram for malignant neoplasm of breast: Secondary | ICD-10-CM

## 2020-11-14 NOTE — Patient Instructions (Signed)
  Amber Perez , Thank you for taking time to come for your Medicare Wellness Visit. I appreciate your ongoing commitment to your health goals. Please review the following plan we discussed and let me know if I can assist you in the future.   These are the goals we discussed:  Goals       Patient Stated     Increase physical activity (pt-stated)      Water aerobics 4 days weekly, 90 minutes        This is a list of the screening recommended for you and due dates:  Health Maintenance  Topic Date Due   Zoster (Shingles) Vaccine (1 of 2) Never done   COVID-19 Vaccine (4 - Booster) 06/04/2020   Flu Shot  11/04/2020   Mammogram  02/11/2021   Tetanus Vaccine  10/14/2027   Colon Cancer Screening  08/17/2029   DEXA scan (bone density measurement)  Completed   Hepatitis C Screening: USPSTF Recommendation to screen - Ages 37-79 yo.  Completed   Pneumonia vaccines  Completed   HPV Vaccine  Aged Out

## 2020-11-14 NOTE — Progress Notes (Signed)
Subjective:   Amber Perez is a 75 y.o. female who presents for Medicare Annual (Subsequent) preventive examination.  Review of Systems    No ROS.  Medicare Wellness Virtual Visit.  Visual/audio telehealth visit, UTA vital signs.   See social history for additional risk factors.         Objective:    Today's Vitals   11/14/20 1317  Weight: 197 lb (89.4 kg)  Height: 4' 11" (1.499 m)   Body mass index is 39.79 kg/m.  Advanced Directives 11/14/2020 05/03/2020 11/14/2019 08/18/2019 03/01/2019 11/02/2018 11/04/2016  Does Patient Have a Medical Advance Directive? No No Yes No No No No  Type of Advance Directive - - Living will;Healthcare Power of Attorney - - - -  Does patient want to make changes to medical advance directive? - - No - Patient declined - - - -  Copy of [place] in Chart? - - No - copy requested - - - -  Would patient like information on creating a medical advance directive? No - Patient declined - - - - Yes (MAU/Ambulatory/Procedural Areas - Information given) No - Patient declined    Current Medications (verified) Outpatient Encounter Medications as of 11/14/2020  Medication Sig   amLODipine (NORVASC) 5 MG tablet TAKE 1 TABLET BY MOUTH EVERY DAY   Calcium-Magnesium (CAL-MAG PO) Take 2 tablets by mouth at bedtime. Takes occasionally   Cholecalciferol (VITAMIN D3 PO) Take 2,500 Units by mouth at bedtime. Takes when remembers   Cyanocobalamin (VITAMIN B-12 PO) Take by mouth.   pantoprazole (PROTONIX) 40 MG tablet TAKE 1 TABLET BY MOUTH EVERY DAY   pregabalin (LYRICA) 50 MG capsule Take 1 capsule (50 mg total) by mouth at bedtime. Take 1-3 hours prior to bed.   PREMARIN vaginal cream PLACE 1 APPLICATORFUL VAGINALLY 2 TIMES A WEEK   triamcinolone (NASACORT) 55 MCG/ACT AERO nasal inhaler Place 2 sprays into the nose as needed.   No facility-administered encounter medications on file as of 11/14/2020.    Allergies (verified) Penicillins and  Bisphosphonates   History: Past Medical History:  Diagnosis Date   Cancer of left kidney ([place])    Closed left arm fracture    [facility]    COPD (chronic obstructive pulmonary disease) ([place])    Dyslipidemia    GERD (gastroesophageal reflux disease)    Hypertension 2008   Osteoarthritis    Osteoporosis    Pneumonia 2008   Pre-diabetes    Thyroid nodule    Vitamin D deficiency    Past Surgical History:  Procedure Laterality Date   ABDOMINAL HYSTERECTOMY  1995   BILATERAL SALPINGOOPHORECTOMY  1995   BREAST BIOPSY Left    neg   BREAST SURGERY     Benign per pt's report   CATARACT EXTRACTION  2009   CATARACT EXTRACTION     CESAREAN SECTION  [id number]   COLONOSCOPY  2007   COLONOSCOPY WITH PROPOFOL N/A 08/18/2019   Procedure: COLONOSCOPY WITH PROPOFOL;  Surgeon: Robert Bellow, MD;  Location: [facility] ENDOSCOPY;  Service: Endoscopy;  Laterality: N/A;   GANGLION CYST EXCISION     KNEE ARTHROPLASTY Right 06/29/2016   Procedure: COMPUTER ASSISTED TOTAL KNEE ARTHROPLASTY;  Surgeon: [name], MD;  Location: [facility] ORS;  Service: Orthopedics;  Laterality: Right;   KNEE ARTHROSCOPY W/ MENISCAL REPAIR Right 04/2012   [name]   PARTIAL NEPHRECTOMY Left    UTERINE FIBROID SURGERY     x 5   Family History  Problem  Relation Age of Onset   Anemia Mother        aplastic   Aplastic anemia Mother    Throat cancer Father    Hypertension Father    Alcohol abuse Father    COPD Father    Diabetes Sister    Esophageal cancer Brother    Colon polyps Sister    Diabetes Brother    Multiple myeloma Brother    Diabetes Brother    Colon cancer Maternal Aunt    Breast cancer Neg Hx    Social History   Socioeconomic History   Marital status: Divorced    Spouse name: Not on file   Number of children: 2   Years of education: Not on file   Highest education level: Not on file  Occupational History   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   Drug use: No   Sexual activity: Never  Other Topics Concern   Not on file  Social History Narrative   Regular Exercise -  Occasional   Daily Caffeine Use:  2 cups coffee & Diet Mt Dew   Left Handed   Lives in one story home   Social Determinants of Health   Financial Resource Strain: Low Risk    Difficulty of Paying Living Expenses: Not hard at all  Food Insecurity: No Food Insecurity   Worried About Running Out of Food in the Last Year: Never true   Ran Out of Food in the Last Year: Never true  Transportation Needs: No Transportation Needs   Lack of Transportation (Medical): No   Lack of Transportation (Non-Medical): No  Physical Activity: Sufficiently Active   Days of Exercise per Week: 3 days   Minutes of Exercise per Session: 90 min  Stress: No Stress Concern Present   Feeling of Stress : Not at all  Social Connections: Unknown   Frequency of Communication with Friends and Family: More than three times a week   Frequency of Social Gatherings with Friends and Family: More than three times a week   Attends Religious Services: Not on file   Active Member of Clubs or Organizations: Not on file   Attends Club or Organization Meetings: Not on file   Marital Status: Not on file    Tobacco Counseling Counseling given: Not Answered   Clinical Intake:  Pre-visit preparation completed: Yes        Diabetes: No  How often do you need to have someone help you when you read instructions, pamphlets, or other written materials from your doctor or pharmacy?: 1 -  Never  Interpreter Needed?: No      Activities of Daily Living In your present state of health, do you have any difficulty performing the following activities: 11/14/2020  Hearing? Y  Comment Hearing aid  Vision? N  Difficulty concentrating or making decisions? N  Walking or climbing stairs? N  Dressing or bathing? N  Doing errands, shopping? N  Preparing   Food and eating ? N  Using the Toilet? N  In the past six months, have you accidently leaked urine? N  Do you have problems with loss of bowel control? N  Managing your Medications? N  Managing your Finances? N  Housekeeping or managing your Housekeeping? N  Some recent data might be hidden    Patient Care Team: Leone Haven, MD as PCP - General (Family Medicine) Alda Berthold, DO as Consulting Physician (Neurology)  Indicate any recent Medical Services you may have received from other than [facility] providers in the past year (date may be approximate).     Assessment:   This is a routine wellness examination for [place].  I connected with Reona today by telephone and verified that I am speaking with the correct person using two identifiers. Location patient: home Location provider: work Persons participating in the virtual visit: patient, Marine scientist.    I discussed the limitations, risks, security and privacy concerns of performing an evaluation and management service by telephone and the availability of in person appointments. The patient expressed understanding and verbally consented to this telephonic visit.    Interactive audio and video telecommunications were attempted between this provider and patient, however failed, due to patient having technical difficulties OR patient did not have access to video capability.  We continued and completed visit with audio only.  Some vital signs may be absent or patient reported.   Hearing/Vision screen Hearing Screening - Comments:: Hearing aid, bilateral Vision Screening -  Comments:: Followed by Dr. Ellin Mayhew Phillip Heal)  Wears corrective lenses Cataract extraction, bilateral  They have regular follow up with the ophthalmologist  Dietary issues and exercise activities discussed: Current Exercise Habits: Home exercise routine Regular diet Good water intake   Goals Addressed               This Visit's Progress     Patient Stated     Increase physical activity (pt-stated)   On track     Water aerobics 4 days weekly, 90 minutes       Depression Screen PHQ 2/9 Scores 11/14/2020 11/29/2019 11/14/2019 08/16/2019 06/06/2019 04/04/2019 11/02/2018  PHQ - 2 Score 0 0 0 0 0 0 0  PHQ- 9 Score - - - - - - -    Fall Risk Fall Risk  11/14/2020 05/03/2020 11/29/2019 11/14/2019 08/16/2019  Falls in the past year? 0 0 0 0 0  Number falls in past yr: 0 0 0 0 0  Injury with Fall? 0 0 0 - -  Risk Factor Category  - - - - -  Risk for fall due to : - - - - -  Follow up Falls evaluation completed - Falls evaluation completed Falls evaluation completed Falls evaluation completed    [place]: Adequate lighting in your home to reduce risk of falls? Yes   TIMED UP AND GO: Was the test performed? No .    Cognitive Function: Patient is alert and oriented x3.  MMSE - Mini Mental State Exam 11/14/2019 11/04/2016  Not completed: Unable to complete -  Orientation to time - 5  Orientation to Place - 5  Registration - 3  Attention/ Calculation - 5  Recall - 3  Language- name 2 objects - 2  Language- repeat - 1  Language- follow 3 step command - 3  Language- read & follow direction - 1  Write a sentence - 1  Copy design - 1  Total score -  30     6CIT Screen 11/02/2018  What Year? 0 points  What month? 0 points  What time? 0 points  Count back from 20 0 points  Months in reverse 0 points  Repeat phrase 0 points  Total Score 0    Immunizations Immunization History  Administered Date(s) Administered   Fluad Quad(high Dose 65+)  01/26/2019, 02/26/2020   Influenza Split 03/19/2011, 01/05/2012   Influenza Whole 01/14/2019   Influenza,inj,Quad PF,6+ Mos 12/21/2013   Influenza-Unspecified 01/11/2015   Moderna Sars-Covid-2 Vaccination 03/06/2020   PFIZER(Purple Top)SARS-COV-2 Vaccination 06/04/2019, 06/27/2019   Pneumococcal Conjugate-13 06/08/2014   Pneumococcal Polysaccharide-23 03/24/2012   Tdap 05/07/2006, 10/13/2017   Zoster, Live 06/30/2012   Health Maintenance Health Maintenance  Topic Date Due   INFLUENZA VACCINE  11/04/2020   COVID-19 Vaccine (4 - Booster) 11/30/2020 (Originally 06/04/2020)   Zoster Vaccines- Shingrix (1 of 2) 02/14/2021 (Originally 03/02/1965)   MAMMOGRAM  02/11/2021   TETANUS/TDAP  10/14/2027   COLONOSCOPY (Pts 45-55yr Insurance coverage will need to be confirmed)  08/17/2029   DEXA SCAN  Completed   Hepatitis C Screening  Completed   PNA vac Low Risk Adult  Completed   HPV VACCINES  Aged Out   Mammogram- ordered  Lung Cancer Screening: (Low Dose CT Chest recommended if Age 74-80years, 30 pack-year currently smoking OR have quit w/in 15years.) does not qualify.   Vision Screening: Recommended annual ophthalmology exams for early detection of glaucoma and other disorders of the eye.  Dental Screening: Recommended annual dental exams for proper oral hygiene  Community Resource Referral / Chronic Care Management: CRR required this visit?  No   CCM required this visit?  No      Plan:     I have personally reviewed and noted the following in the patient's chart:   Medical and social history Use of alcohol, tobacco or illicit drugs  Current medications and supplements including opioid prescriptions. Patient is not currently taking opioid. Functional ability and status Nutritional status Physical activity Advanced directives List of other physicians Hospitalizations, surgeries, and ER visits in previous 12 months Vitals Screenings to include cognitive, depression, and  falls Referrals and appointments  In addition, I have reviewed and discussed with patient certain preventive protocols, quality metrics, and best practice recommendations. A written personalized care plan for preventive services as well as general preventive health recommendations were provided to patient via mychart.     OVarney Biles LPN   [id number]

## 2020-11-19 ENCOUNTER — Ambulatory Visit: Payer: Medicare Other | Attending: Internal Medicine

## 2020-11-19 ENCOUNTER — Other Ambulatory Visit: Payer: Self-pay

## 2020-11-19 DIAGNOSIS — Z23 Encounter for immunization: Secondary | ICD-10-CM

## 2020-11-19 MED ORDER — PFIZER-BIONT COVID-19 VAC-TRIS 30 MCG/0.3ML IM SUSP
INTRAMUSCULAR | 0 refills | Status: DC
Start: 2020-11-19 — End: 2021-04-22
  Filled 2020-11-19: qty 0.3, 1d supply, fill #0

## 2020-11-19 NOTE — Progress Notes (Signed)
   Covid-19 Vaccination Clinic  Name:  Amber Perez    MRN: [id number] DOB: 08-17-1945  11/19/2020  Amber Perez was observed post Covid-19 immunization for 15 minutes without incident. She was provided with Vaccine Information Sheet and instruction to access the V-Safe system.   Amber Perez was instructed to call 911 with any severe reactions post vaccine: Difficulty breathing  Swelling of face and throat  A fast heartbeat  A bad rash all over body  Dizziness and weakness   Immunizations Administered     Name Date Dose VIS Date Route   PFIZER Comrnaty(Gray TOP) Covid-19 Vaccine 11/19/2020  3:00 PM 0.3 mL 03/14/2020 Intramuscular   Manufacturer: [place]   Lot: [id number]   NDC: [place], PharmD, MBA Clinical Acute Care Pharmacist

## 2021-01-06 ENCOUNTER — Ambulatory Visit
Admission: EM | Admit: 2021-01-06 | Discharge: 2021-01-06 | Disposition: A | Discharge status: Home / Self Care | Payer: Medicare Other | Attending: Family Medicine | Admitting: Family Medicine

## 2021-01-06 ENCOUNTER — Encounter: Payer: Self-pay | Admitting: Emergency Medicine

## 2021-01-06 ENCOUNTER — Other Ambulatory Visit: Payer: Self-pay

## 2021-01-06 ENCOUNTER — Encounter: Payer: Self-pay | Admitting: Family Medicine

## 2021-01-06 DIAGNOSIS — Z780 Asymptomatic menopausal state: Secondary | ICD-10-CM

## 2021-01-06 DIAGNOSIS — U071 COVID-19: Secondary | ICD-10-CM

## 2021-01-06 DIAGNOSIS — K219 Gastro-esophageal reflux disease without esophagitis: Secondary | ICD-10-CM

## 2021-01-06 MED ORDER — NIRMATRELVIR/RITONAVIR (PAXLOVID)TABLET: 3.0000 | ORAL_TABLET | Freq: Two times a day (BID) | ORAL | 0 refills | Status: AC

## 2021-01-06 NOTE — ED Triage Notes (Signed)
Pt c/o runny nose, headache, body aches, cough, nasal congestion. Started about 2 days ago. She states she had a home test and was positive.

## 2021-01-06 NOTE — Telephone Encounter (Signed)
She would need to have an evaluation to help determine appropriate management and treatment of this issue. She could be seen virtually by any available provider. If there are none available she could to a virtual visit through [facility] for this.

## 2021-01-07 NOTE — Telephone Encounter (Signed)
Patient was seen by Urgent care on yesterday.  Knut Rondinelli,cma

## 2021-01-08 NOTE — ED Provider Notes (Signed)
[facility]    CSN: [id number] Arrival date & time: 01/06/21  1808      History   Chief Complaint Chief Complaint  Patient presents with   Covid Positive    HPI 75 year old female presents with the above complaints.  Patient reports that she has had runny nose, headache, body aches, cough, congestion.  Started 2 days ago.  Took a home COVID test that was positive.  She has had a recent sick contact.  No relieving factors.  No other complaints or concerns at this time.  Past Medical History:  Diagnosis Date   Cancer of left kidney ([place])    Closed left arm fracture    [facility]    COPD (chronic obstructive pulmonary disease) ([place])    Dyslipidemia    GERD (gastroesophageal reflux disease)    Hypertension 2008   Osteoarthritis    Osteoporosis    Pneumonia 2008   Pre-diabetes    Thyroid nodule    Vitamin D deficiency     Patient Active Problem List   Diagnosis Date Noted   Urinary frequency 02/26/2020   Dysuria 02/26/2020   Class 2 severe obesity due to excess calories with serious comorbidity and body mass index (BMI) of 36.0 to 36.9 in adult ([place]) 11/29/2019   Chronic pain of left knee 11/29/2019   Abdominal discomfort 11/29/2019   Low vitamin B12 level 08/16/2019   RLS (restless legs syndrome) 08/16/2019   Vitamin D deficiency 08/16/2019   Sinusitis 06/06/2019   Hip pain 04/05/2019   Overactive bladder 04/05/2019   Cancer of kidney ([place]) 04/05/2019   Tingling 04/05/2019   Lightheadedness 04/05/2019   Breast pain 07/20/2018   Dyspnea on exertion 07/14/2018   Encounter for general adult medical examination with abnormal findings 08/04/2017   Rib pain 06/23/2017   Right knee pain 12/14/2016   S/P total knee arthroplasty 06/29/2016   Essential hypertension 01/25/2014   Obesity (BMI 30-39.9) 01/25/2014   Atrophic vaginitis 03/24/2012   Thyroid nodule 11/25/2011   Osteopenia 11/25/2011   Hyperlipidemia 11/25/2011   GERD (gastroesophageal  reflux disease) 03/19/2011   Allergic rhinitis 03/19/2011    Past Surgical History:  Procedure Laterality Date   ABDOMINAL HYSTERECTOMY  1995   BILATERAL SALPINGOOPHORECTOMY  1995   BREAST BIOPSY Left    neg   BREAST SURGERY     Benign per pt's report   CATARACT EXTRACTION  2009   CATARACT EXTRACTION     CESAREAN SECTION  [id number]   COLONOSCOPY  2007   COLONOSCOPY WITH PROPOFOL N/A 08/18/2019   Procedure: COLONOSCOPY WITH PROPOFOL;  Surgeon: Robert Bellow, MD;  Location: [facility];  Service: Endoscopy;  Laterality: N/A;   GANGLION CYST EXCISION     KNEE ARTHROPLASTY Right 06/29/2016   Procedure: COMPUTER ASSISTED TOTAL KNEE ARTHROPLASTY;  Surgeon: [name], MD;  Location: [facility];  Service: Orthopedics;  Laterality: Right;   KNEE ARTHROSCOPY W/ MENISCAL REPAIR Right 04/2012   [name]   PARTIAL NEPHRECTOMY Left    UTERINE FIBROID SURGERY     x 5    OB History   No obstetric history on file.      Home Medications    Prior to Admission medications   Medication Sig Start Date End Date Taking? Authorizing Provider  amLODipine (NORVASC) 5 MG tablet TAKE 1 TABLET BY MOUTH EVERY DAY 08/05/20  Yes Leone Haven, MD  Calcium-Magnesium (CAL-MAG PO) Take 2 tablets by mouth at bedtime. Takes occasionally   Yes  [provider]  Cholecalciferol (VITAMIN D3 PO) Take 2,500 Units by mouth at bedtime. Takes when remembers   Yes [provider]  Cyanocobalamin (VITAMIN B-12 PO) Take by mouth.   Yes [provider]  nirmatrelvir/ritonavir EUA (PAXLOVID) 20 x 150 MG & 10 x 100MG TABS Take 3 tablets by mouth 2 (two) times daily for 5 days. Patient GFR is 78. Take nirmatrelvir (150 mg) two tablets twice daily for 5 days and ritonavir (100 mg) one tablet twice daily for 5 days. 01/06/21 01/11/21 Yes Verdon Ferrante G, DO  pantoprazole (PROTONIX) 40 MG tablet TAKE 1 TABLET BY MOUTH EVERY DAY 09/07/19  Yes Leone Haven, MD  pregabalin (LYRICA) 50 MG  capsule Take 1 capsule (50 mg total) by mouth at bedtime. Take 1-3 hours prior to bed. 12/30/19  Yes Leone Haven, MD  PREMARIN vaginal cream PLACE 1 APPLICATORFUL VAGINALLY 2 TIMES A WEEK 10/08/20  Yes Leone Haven, MD  triamcinolone (NASACORT) 55 MCG/ACT AERO nasal inhaler Place 2 sprays into the nose as needed.   Yes [provider]  COVID-19 mRNA Vac-TriS, Pfizer, (PFIZER-BIONT COVID-19 VAC-TRIS) SUSP injection Inject into the muscle. 11/19/20   Carlyle Basques, MD    Family History Family History  Problem Relation Age of Onset   Anemia Mother        aplastic   Aplastic anemia Mother    Throat cancer Father    Hypertension Father    Alcohol abuse Father    COPD Father    Diabetes Sister    Esophageal cancer Brother    Colon polyps Sister    Diabetes Brother    Multiple myeloma Brother    Diabetes Brother    Colon cancer Maternal Aunt    Breast cancer Neg Hx     Social History Social History   Tobacco Use   Smoking status: Former    Packs/day: 2.00    Years: 18.50    Pack years: 37.00    Types: Cigarettes    Quit date: 03/18/2006    Years since quitting: 14.8   Smokeless tobacco: Never  Vaping Use   Vaping Use: Never used  Substance Use Topics   Alcohol use: Yes    Alcohol/week: 0.0 - 2.0 standard drinks    Comment: Occasional glass of wine, sometimes 2 in a week, sometimes 1 every couple of months.   Drug use: No     Allergies   Penicillins and Bisphosphonates   Review of Systems Review of Systems Per HPI  Physical Exam Triage Vital Signs ED Triage Vitals  Enc Vitals Group     BP 01/06/21 1912 (!) 145/95     Pulse Rate 01/06/21 1912 91     Resp 01/06/21 1912 18     Temp 01/06/21 1912 98.1 F (36.7 C)     Temp Source 01/06/21 1912 Oral     SpO2 01/06/21 1912 99 %     Weight 01/06/21 1910 197 lb 1.5 oz (89.4 kg)     Height 01/06/21 1910 _0  (1.499 m)     Head Circumference --      Peak Flow --      Pain Score 01/06/21 1909 5      Pain Loc --      Pain Edu? --      Excl. in [place]? --    Updated Vital Signs BP (!) 145/95 (BP Location: Right Arm)   Pulse 91   Temp 98.1 F (36.7 C) (Oral)  Resp 18   Ht _0  (1.499 m)   Wt 89.4 kg   SpO2 99%   BMI 39.81 kg/m   Visual Acuity Right Eye Distance:   Left Eye Distance:   Bilateral Distance:    Right Eye Near:   Left Eye Near:    Bilateral Near:     Physical Exam Vitals and nursing note reviewed.  Constitutional:      General: She is not in acute distress.    Appearance: Normal appearance. She is not ill-appearing.  HENT:     Head: Normocephalic and atraumatic.  Cardiovascular:     Rate and Rhythm: Normal rate and regular rhythm.  Pulmonary:     Effort: Pulmonary effort is normal.     Breath sounds: Normal breath sounds. No wheezing or rales.  Neurological:     Mental Status: She is alert.  Psychiatric:        Mood and Affect: Mood normal.        Behavior: Behavior normal.     UC Treatments / Results  Labs (all labs ordered are listed, but only abnormal results are displayed) Labs Reviewed - No data to display  EKG   Radiology No results found.  Procedures Procedures (including critical care time)  Medications Ordered in UC Medications - No data to display  Initial Impression / Assessment and Plan / UC Course  I have reviewed the triage vital signs and the nursing notes.  Pertinent labs & imaging results that were available during my care of the patient were reviewed by me and considered in my medical decision making (see chart for details).    75 year old female presents with COVID-19.  Positive home test.  Given age and comorbidities, treating with Paxlovid.  Recent GFR was 78.  Final Clinical Impressions(s) / UC Diagnoses   Final diagnoses:  COVID   Discharge Instructions   None    ED Prescriptions     Medication Sig Dispense Auth. Provider   nirmatrelvir/ritonavir EUA (PAXLOVID) 20 x 150 MG & 10 x 100MG TABS Take  3 tablets by mouth 2 (two) times daily for 5 days. Patient GFR is 78. Take nirmatrelvir (150 mg) two tablets twice daily for 5 days and ritonavir (100 mg) one tablet twice daily for 5 days. 30 tablet Coral Spikes, DO      PDMP not reviewed this encounter.   Coral Spikes, [place] 01/08/21 [phone]

## 2021-01-10 ENCOUNTER — Other Ambulatory Visit: Payer: Self-pay

## 2021-01-10 DIAGNOSIS — I10 Essential (primary) hypertension: Secondary | ICD-10-CM

## 2021-01-10 MED ORDER — AMLODIPINE BESYLATE 5 MG PO TABS: 5.0000 mg | ORAL_TABLET | Freq: Every day | ORAL | 1 refills | Status: DC

## 2021-01-29 ENCOUNTER — Other Ambulatory Visit: Payer: Self-pay | Admitting: Family Medicine

## 2021-01-29 DIAGNOSIS — K219 Gastro-esophageal reflux disease without esophagitis: Secondary | ICD-10-CM

## 2021-01-29 MED ORDER — PANTOPRAZOLE SODIUM 40 MG PO TBEC: 40.0000 mg | DELAYED_RELEASE_TABLET | Freq: Every day | ORAL | 0 refills | Status: DC

## 2021-02-06 MED ORDER — PREMARIN 0.625 MG/GM VA CREA: TOPICAL_CREAM | VAGINAL | 0 refills | Status: DC

## 2021-02-06 NOTE — Addendum Note (Signed)
Addended by: Fulton Mole D on: 02/06/2021 08:29 AM   Modules accepted: Orders

## 2021-02-24 ENCOUNTER — Telehealth: Payer: Self-pay | Admitting: Family Medicine

## 2021-02-24 ENCOUNTER — Other Ambulatory Visit: Payer: Self-pay | Admitting: Family Medicine

## 2021-02-24 DIAGNOSIS — Z780 Asymptomatic menopausal state: Secondary | ICD-10-CM

## 2021-02-24 NOTE — Telephone Encounter (Signed)
PLS IGNORE PREVIOUS MESSAGE!   Pt needs a medication refill for conjugated estrogens (PREMARIN) vaginal cream  Not a refill for prednisone. Pt uses [facility] in [facility]

## 2021-02-24 NOTE — Telephone Encounter (Signed)
Pt calling in regards to medication refill for prednisone cream. [facility] [place]. Pt also scheduled a CPE for 11/30 at 8:30.

## 2021-02-24 NOTE — Telephone Encounter (Signed)
PLS IGNORE PREVIOUS MESSAGE!    Pt needs a medication refill for conjugated estrogens (PREMARIN) vaginal cream  Not a refill for prednisone. Pt uses [facility] in [place].  Legna Mausolf,cma

## 2021-02-25 MED ORDER — PREMARIN 0.625 MG/GM VA CREA: TOPICAL_CREAM | VAGINAL | 1 refills | Status: AC

## 2021-02-25 NOTE — Telephone Encounter (Signed)
Sent to pharmacy 

## 2021-03-04 ENCOUNTER — Telehealth: Payer: Self-pay | Admitting: Family Medicine

## 2021-03-04 NOTE — Telephone Encounter (Signed)
The patient was scheduled for 03/05/21 .She wants her physical done by the end of the year. She also wants to discuss issue regarding her Gall Bladder as well.

## 2021-03-05 ENCOUNTER — Other Ambulatory Visit: Payer: Self-pay

## 2021-03-05 ENCOUNTER — Other Ambulatory Visit: Payer: Self-pay | Admitting: Family Medicine

## 2021-03-05 ENCOUNTER — Encounter: Payer: Medicare Other | Admitting: Family Medicine

## 2021-03-05 DIAGNOSIS — Z0000 Encounter for general adult medical examination without abnormal findings: Secondary | ICD-10-CM

## 2021-03-05 DIAGNOSIS — Z1231 Encounter for screening mammogram for malignant neoplasm of breast: Secondary | ICD-10-CM

## 2021-03-05 NOTE — Telephone Encounter (Signed)
Noted  

## 2021-03-05 NOTE — Telephone Encounter (Signed)
She can be scheduled on 12/8 at 2:30 pm.

## 2021-03-11 ENCOUNTER — Other Ambulatory Visit: Payer: Medicare Other

## 2021-03-14 ENCOUNTER — Other Ambulatory Visit: Payer: Self-pay

## 2021-03-14 ENCOUNTER — Ambulatory Visit (INDEPENDENT_AMBULATORY_CARE_PROVIDER_SITE_OTHER): Payer: Medicare Other | Admitting: Family Medicine

## 2021-03-14 ENCOUNTER — Telehealth: Payer: Self-pay | Admitting: Acute Care

## 2021-03-14 ENCOUNTER — Other Ambulatory Visit: Payer: Self-pay | Admitting: *Deleted

## 2021-03-14 ENCOUNTER — Encounter: Payer: Self-pay | Admitting: Family Medicine

## 2021-03-14 VITALS — BP 135/80 | HR 83 | Temp 97.9°F | Ht 59.0 in | Wt 194.0 lb

## 2021-03-14 DIAGNOSIS — Z87891 Personal history of nicotine dependence: Secondary | ICD-10-CM

## 2021-03-14 DIAGNOSIS — Z6836 Body mass index (BMI) 36.0-36.9, adult: Secondary | ICD-10-CM | POA: Diagnosis not present

## 2021-03-14 DIAGNOSIS — N952 Postmenopausal atrophic vaginitis: Secondary | ICD-10-CM

## 2021-03-14 DIAGNOSIS — K8020 Calculus of gallbladder without cholecystitis without obstruction: Secondary | ICD-10-CM | POA: Diagnosis not present

## 2021-03-14 DIAGNOSIS — E782 Mixed hyperlipidemia: Secondary | ICD-10-CM

## 2021-03-14 DIAGNOSIS — Z0001 Encounter for general adult medical examination with abnormal findings: Secondary | ICD-10-CM | POA: Diagnosis not present

## 2021-03-14 DIAGNOSIS — Z23 Encounter for immunization: Secondary | ICD-10-CM

## 2021-03-14 DIAGNOSIS — Z13220 Encounter for screening for lipoid disorders: Secondary | ICD-10-CM

## 2021-03-14 DIAGNOSIS — Z8041 Family history of malignant neoplasm of ovary: Secondary | ICD-10-CM | POA: Diagnosis not present

## 2021-03-14 DIAGNOSIS — Z1329 Encounter for screening for other suspected endocrine disorder: Secondary | ICD-10-CM | POA: Diagnosis not present

## 2021-03-14 DIAGNOSIS — Z130 Encounter for screening for diseases of the blood and blood-forming organs and certain disorders involving the immune mechanism: Secondary | ICD-10-CM

## 2021-03-14 LAB — COMPREHENSIVE METABOLIC PANEL
ALT: 34 U/L (ref 0–35)
AST: 26 U/L (ref 0–37)
Albumin: 4.2 g/dL (ref 3.5–5.2)
Alkaline Phosphatase: 59 U/L (ref 39–117)
BUN: 16 mg/dL (ref 6–23)
CO2: 26 mEq/L (ref 19–32)
Calcium: 9.4 mg/dL (ref 8.4–10.5)
Chloride: 102 mEq/L (ref 96–112)
Creatinine, Ser: 0.73 mg/dL (ref 0.40–1.20)
GFR: 81.23 mL/min (ref >60.00)
Glucose, Bld: 104 mg/dL — ABNORMAL HIGH (ref 70–99)
Potassium: 3.9 mEq/L (ref 3.5–5.1)
Sodium: 138 mEq/L (ref 135–145)
Total Bilirubin: 0.4 mg/dL (ref 0.2–1.2)
Total Protein: 7.2 g/dL (ref 6.0–8.3)

## 2021-03-14 LAB — CBC
HCT: 39.7 % (ref 36.0–46.0)
Hemoglobin: 13.3 g/dL (ref 12.0–15.0)
MCHC: 33.4 g/dL (ref 30.0–36.0)
MCV: 90 fl (ref 78.0–100.0)
Platelets: 260 10*3/uL (ref 150.0–400.0)
RBC: 4.41 Mil/uL (ref 3.87–5.11)
RDW: 14.6 % (ref 11.5–15.5)
WBC: 5.9 10*3/uL (ref 4.0–10.5)

## 2021-03-14 LAB — LIPID PANEL
Cholesterol: 187 mg/dL (ref 0–200)
HDL: 58.3 mg/dL (ref >39.00)
LDL Cholesterol: 97 mg/dL (ref 0–99)
NonHDL: 129.08
Total CHOL/HDL Ratio: 3
Triglycerides: 161 mg/dL — ABNORMAL HIGH (ref 0.0–149.0)
VLDL: 32.2 mg/dL (ref 0.0–40.0)

## 2021-03-14 LAB — HEMOGLOBIN A1C: Hgb A1c MFr Bld: 6.2 % (ref 4.6–6.5)

## 2021-03-14 LAB — TSH: TSH: 1.27 u[IU]/mL (ref 0.35–5.50)

## 2021-03-14 NOTE — Telephone Encounter (Signed)
Left pt a message to call regarding f/u lung screening CT. Records show that pt quit smoking in 2007. Will need to clarify with pt prior to scheduling.

## 2021-03-14 NOTE — Assessment & Plan Note (Signed)
Chronic issues.  Stable.  Discussed that she may just be in the donut hole with her insurance and that the price may on her Premarin go back down in January.  She will let us know if the price is not affordable in January.

## 2021-03-14 NOTE — Assessment & Plan Note (Signed)
The patient does have a history of gallstones and has symptoms that seemingly could be related to her gallstones with upper quadrant abdominal discomfort and right shoulder discomfort after eating fatty foods.  We will refer her to general surgery to get their input.

## 2021-03-14 NOTE — Patient Instructions (Signed)
Nice to see you. General surgery will call you to schedule an appointment. If the Premarin price does not go back down in January please let us know. Please continue with diet and exercise. Please consider getting the newest COVID booster and the Shingrix vaccine.

## 2021-03-14 NOTE — Progress Notes (Signed)
[name], MD Phone: [phone]  Amber Perez is a 75 y.o. female who presents today for CPE.  Diet: has cut down on fried foods, eats some junk, no fast food, no soda or sweet tea Exercise: goes to the pool 2-3x/week for 1.5 hours Pap smear: aged out Colonoscopy: 08/18/19 negative Mammogram: scheduled Family history-  Colon cancer: no   Breast cancer: paternal aunt  Ovarian cancer: maternal aunt Vaccines-   Flu: due  Tetanus: UTD  Shingles: due  COVID19: x4  Pneumonia: UTD Hep C Screening: UTD Tobacco use: former smoker Alcohol use: occasional Illicit Drug use: no Dentist: yes Ophthalmology: yes  Gallstones: Patient has a history of a gallstone.  She notes at times when she eats fatty foods she gets some right shoulder discomfort and also has bilateral upper quadrant pain.  No diarrhea, vomiting, or blood in her stool.  Atrophic vaginitis: Patient notes Premarin works well for her though the cost went.  She does have enough to last until January.  She did wonder about a low-dose OCP to see if that would provide the same benefit at a lower cost.   Active Ambulatory Problems    Diagnosis Date Noted   GERD (gastroesophageal reflux disease) 03/19/2011   Allergic rhinitis 03/19/2011   Thyroid nodule 11/25/2011   Osteopenia 11/25/2011   Hyperlipidemia 11/25/2011   Atrophic vaginitis 03/24/2012   Essential hypertension 01/25/2014   Obesity (BMI 30-39.9) 01/25/2014   S/P total knee arthroplasty 06/29/2016   Right knee pain 12/14/2016   Rib pain 06/23/2017   Encounter for general adult medical examination with abnormal findings 08/04/2017   Dyspnea on exertion 07/14/2018   Breast pain 07/20/2018   Hip pain 04/05/2019   Overactive bladder 04/05/2019   Cancer of kidney ([place]) 04/05/2019   Tingling 04/05/2019   Lightheadedness 04/05/2019   Sinusitis 06/06/2019   Low vitamin B12 level 08/16/2019   RLS (restless legs syndrome) 08/16/2019   Vitamin D  deficiency 08/16/2019   Class 2 severe obesity due to excess calories with serious comorbidity and body mass index (BMI) of 36.0 to 36.9 in adult [facility]) 11/29/2019   Chronic pain of left knee 11/29/2019   Abdominal discomfort 11/29/2019   Urinary frequency 02/26/2020   Dysuria 02/26/2020   Calculus of gallbladder without cholecystitis without obstruction 03/14/2021   Resolved Ambulatory Problems    Diagnosis Date Noted   Osteoporosis 03/19/2011   Welcome to Medicare preventive visit 03/24/2012   Osteoarthritis of right knee 03/24/2012   Medicare annual wellness visit, subsequent 06/20/2013   Family history of diabetes mellitus 06/20/2013   Elevated BP 12/21/2013   Elevated LFTs 12/21/2013   Acute maxillary sinusitis 06/21/2014   Past Medical History:  Diagnosis Date   Cancer of left kidney ([place])    Closed left arm fracture    COPD (chronic obstructive pulmonary disease) ([place])    Dyslipidemia    Hypertension 2008   Osteoarthritis    Pneumonia 2008   Pre-diabetes    Vitamin D deficiency     Family History  Problem Relation Age of Onset   Anemia Mother        aplastic   Aplastic anemia Mother    Throat cancer Father    Hypertension Father    Alcohol abuse Father    COPD Father    Diabetes Sister    Esophageal cancer Brother    Colon polyps Sister    Diabetes Brother    Multiple myeloma Brother    Diabetes Brother  Colon cancer Maternal Aunt    Breast cancer Neg Hx     Social History   Socioeconomic History   Marital status: Divorced    Spouse name: Not on file   Number of children: 2   Years of education: Not on file   Highest education level: Not on file  Occupational History   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   Drug use: No   Sexual activity: Never  Other Topics Concern   Not on file  Social History Narrative   Regular Exercise -  Occasional   Daily Caffeine Use:  2 cups coffee & Diet Mt Dew   Left Handed   Lives in one story home   Social Determinants of Health   Financial Resource Strain: Low Risk    Difficulty of Paying Living Expenses: Not hard at all  Food Insecurity: No Food Insecurity   Worried About Charity fundraiser in the Last Year: Never true   [place] in the Last Year: Never true  Transportation Needs: No Transportation Needs   Lack of Transportation (Medical): No   Lack of Transportation (Non-Medical): No  Physical Activity: Sufficiently Active   Days of Exercise per Week: 3 days   Minutes of Exercise per Session: 90 min  Stress: No Stress Concern Present   Feeling of Stress : Not at all  Social Connections: Unknown   Frequency of Communication with Friends and Family: More than three times a week   Frequency of Social Gatherings with Friends and Family: More than three times a week   Attends Religious Services: Not on Electrical engineer or Organizations: Not on file   Attends Archivist Meetings: Not on file   Marital Status: Not on file  Intimate Partner Violence: Not At Risk   Fear of Current or Ex-Partner: No   Emotionally Abused: No   Physically Abused: No   Sexually Abused: No    ROS  General:  Negative for nexplained weight loss, fever Skin: Negative for new or changing mole, sore that won't heal HEENT: Negative for trouble hearing, trouble seeing, ringing in ears, mouth sores, hoarseness, change in voice, dysphagia. CV:  Negative for chest pain, dyspnea, edema, palpitations Resp: Negative for cough, dyspnea, hemoptysis GI:  Negative for nausea, vomiting, diarrhea, constipation, abdominal pain, melena, hematochezia. GU: Positive for urinary frequency, negative for dysuria, incontinence, urinary hesitance, hematuria, vaginal or penile discharge, sexual difficulty, lumps in testicle or breasts MSK: Positive for muscle cramps or aches, joint pain or swelling Neuro: Negative for headaches, weakness, numbness, dizziness, passing out/fainting Psych: Negative for depression, anxiety, memory problems  Objective  Physical Exam Vitals:   03/14/21 0938  BP: 135/80  Pulse: 83  Temp: 97.9 F (36.6 C)  SpO2: 97%    BP Readings from Last 3 Encounters:  03/14/21 135/80  01/06/21 (!) 145/95  05/03/20 136/74   Wt Readings from Last  3 Encounters:  03/14/21 194 lb (88 kg)  01/06/21 197 lb 1.5 oz (89.4 kg)  11/14/20 197 lb (89.4 kg)    Physical Exam Constitutional:      General: She is not in acute distress.    Appearance: She is not diaphoretic.  HENT:     Head: Normocephalic and atraumatic.  Cardiovascular:     Rate and Rhythm: Normal rate and regular rhythm.     Heart sounds: Normal heart sounds.  Pulmonary:     Effort: Pulmonary effort is normal.     Breath sounds: Normal breath sounds.  Abdominal:     General: Bowel sounds are normal. There is no distension.     Palpations: Abdomen is soft.     Tenderness: There is no abdominal tenderness. There is no guarding or rebound.  Musculoskeletal:     Right lower leg: No edema.     Left lower leg: No edema.  Skin:    General: Skin is warm and dry.  Neurological:     Mental Status: She is alert.  Psychiatric:        Mood and Affect: Mood normal.     Assessment/Plan:   Problem List Items Addressed This Visit     Atrophic vaginitis    Chronic issues.  Stable.  Discussed that she may just be in the donut hole with her insurance and that the price may on her Premarin go back down in January.  She will let us know if the price is not affordable in  January.      Calculus of gallbladder without cholecystitis without obstruction    The patient does have a history of gallstones and has symptoms that seemingly could be related to her gallstones with upper quadrant abdominal discomfort and right shoulder discomfort after eating fatty foods.  We will refer her to general surgery to get their input.      Relevant Orders   Ambulatory referral to General Surgery   Class 2 severe obesity due to excess calories with serious comorbidity and body mass index (BMI) of 36.0 to 36.9 in adult [facility])   Relevant Orders   HgB A1c   Encounter for general adult medical examination with abnormal findings - Primary    Physical exam completed.  I encouraged continued exercise and healthy diet.  Discussed referral for genetic counseling given her family history of ovarian cancer.  She will have her mammogram as scheduled.  Flu vaccine given today.  I encouraged her to get the updated COVID booster and the Shingrix vaccine.  We will refer her for lung cancer screening as she is due for that at this time.  Lab work as outlined.      Hyperlipidemia   Relevant Orders   Comp Met (CMET)   Lipid panel   Other Visit Diagnoses     Family history of ovarian cancer       Relevant Orders   Ambulatory referral to Genetics   Lipid screening       History of tobacco abuse       Relevant Orders   Ambulatory Referral Lung Cancer Screening [facility]   Thyroid disorder screen       Relevant Orders   TSH   Screening for deficiency anemia       Relevant Orders   CBC       Return in about 6 months (around 09/12/2021) for Hypertension.  This visit occurred during the SARS-CoV-2 public health emergency.  Safety protocols were  in place, including screening questions prior to the visit, additional usage of staff PPE, and extensive cleaning of exam room while observing appropriate contact time as indicated for disinfecting solutions.    [name],  MD [place]

## 2021-03-14 NOTE — Assessment & Plan Note (Signed)
Physical exam completed.  I encouraged continued exercise and healthy diet.  Discussed referral for genetic counseling given her family history of ovarian cancer.  She will have her mammogram as scheduled.  Flu vaccine given today.  I encouraged her to get the updated COVID booster and the Shingrix vaccine.  We will refer her for lung cancer screening as she is due for that at this time.  Lab work as outlined.

## 2021-03-18 ENCOUNTER — Encounter: Payer: Self-pay | Admitting: Surgery

## 2021-03-18 ENCOUNTER — Ambulatory Visit: Payer: Medicare Other | Admitting: Surgery

## 2021-03-18 ENCOUNTER — Other Ambulatory Visit: Payer: Self-pay

## 2021-03-18 VITALS — BP 136/82 | HR 90 | Temp 97.6°F | Ht 59.5 in | Wt 193.6 lb

## 2021-03-18 DIAGNOSIS — K8010 Calculus of gallbladder with chronic cholecystitis without obstruction: Secondary | ICD-10-CM

## 2021-03-18 DIAGNOSIS — K8020 Calculus of gallbladder without cholecystitis without obstruction: Secondary | ICD-10-CM

## 2021-03-18 NOTE — Patient Instructions (Addendum)
Your Ultrasound is scheduled for 03/21/2021 at 10 am, nothing to eat or drink after midnight. Arrive at the [facility] at [facility].    If you have any concerns or questions, please feel free to call our office. See follow up appointment below.   Cholelithiasis Cholelithiasis happens when gallstones form in the gallbladder. The gallbladder stores bile. Bile is a fluid that helps digest fats. Bile can harden and form into gallstones. If they cause a blockage, they can cause pain (gallbladder attack). What are the causes? This condition may be caused by: Some blood diseases, such as sickle cell anemia. Too much of a fat-like substance (cholesterol) in your bile. Not enough bile salts in your bile. These salts help the body absorb and digest fats. The gallbladder not emptying fully or often enough. This is common in pregnant women. What increases the risk? The following factors may make you more likely to develop this condition: Being female. Being pregnant many times. Eating a lot of fried foods, fat, and refined carbs (refined carbohydrates). Being very overweight (obese). Being older than age 80. Using medicines with female hormones in them for a long time. Losing weight fast. Having gallstones in your family. Having some health problems, such as diabetes, Crohn's disease, or liver disease. What are the signs or symptoms? Often, there may be gallstones but no symptoms. These gallstones are called silent gallstones. If a gallstone causes a blockage, you may get sudden pain. The pain: Can be in the upper right part of your belly (abdomen). Normally comes at night or after you eat. Can last an hour or more. Can spread to your right shoulder, back, or chest. Can feel like discomfort, burning, or fullness in the upper part of your belly (indigestion). If the blockage lasts more than a few hours, you can get an infection or swelling. You may: Feel like you may vomit. Vomit. Feel  bloated. Have belly pain for 5 hours or more. Feel tender in your belly, often in the upper right part and under your ribs. Have fever or chills. Have skin or the white parts of your eyes turn yellow (jaundice). Have dark pee (urine) or pale poop (stool). How is this treated? Treatment for this condition depends on how bad you feel. If you have symptoms, you may need: Home care, if symptoms are not very bad. Do not eat for 12-24 hours. Drink only water and clear liquids. Start to eat simple or clear foods after 1 or 2 days. Try broths and crackers. You may need medicines for pain or stomach upset or both. If you have an infection, you will need antibiotics. A hospital stay, if you have very bad pain or a very bad infection. Surgery to remove your gallbladder. You may need this if: Gallstones keep coming back. You have very bad symptoms. Medicines to break up gallstones. Medicines: Are best for small gallstones. May be used for up to 6-12 months. A procedure to find and take out gallstones or to break up gallstones. Follow these instructions at home: Medicines Take over-the-counter and prescription medicines only as told by your doctor. If you were prescribed an antibiotic medicine, take it as told by your doctor. Do not stop taking the antibiotic even if you start to feel better. Ask your doctor if the medicine prescribed to you requires you to avoid driving or using machinery. Eating and drinking Drink enough fluid to keep your urine pale yellow. Drink water or clear fluids. This is important when you have  pain. Eat healthy foods. Choose: Fewer fatty foods, such as fried foods. Fewer refined carbs. Avoid breads and grains that are highly processed, such as white bread and white rice. Choose whole grains, such as whole-wheat bread and brown rice. More fiber. Almonds, fresh fruit, and beans are healthy sources. General instructions Keep a healthy weight. Keep all follow-up visits as  told by your doctor. This is important. Where to find more information [employer] of Diabetes and Digestive and Kidney Diseases: [email or web address] Contact a doctor if: You have sudden pain in the upper right part of your belly. Pain might spread to your right shoulder, back, or chest. You have been diagnosed with gallstones that have no symptoms and you get: Belly pain. Discomfort, burning, or fullness in the upper part of your abdomen. You have dark urine or pale stools. Get help right away if: You have sudden pain in the upper right part of your abdomen, and the pain lasts more than 2 hours. You have pain in your abdomen, and: It lasts more than 5 hours. It keeps getting worse. You have a fever or chills. You keep feeling like you may vomit. You keep vomiting. Your skin or the white parts of your eyes turn yellow. Summary Cholelithiasis happens when gallstones form in the gallbladder. This condition may be caused by a blood disease, too much of a fat-like substance in the bile, or not enough bile salts in bile. Treatment for this condition depends on how bad you feel. If you have symptoms, do not eat or drink. You may need medicines. You may need a hospital stay for very bad pain or a very bad infection. You may need surgery if gallstones keep coming back or if you have very bad symptoms. This information is not intended to replace advice given to you by your health care provider. Make sure you discuss any questions you have with your health care provider. Document Revised: 05/12/2019 Document Reviewed: 02/13/2019 Elsevier Patient Education  2022 [employer].

## 2021-03-18 NOTE — Progress Notes (Signed)
Patient ID: Amber Perez, female   DOB: 10/31/1945, 76 y.o.   MRN: [id number]  Chief Complaint: Right costal pain  History of Present Illness Amber Perez is a 75 y.o. female with a history of bilateral costal pain, seemingly aggravated by eating.  She reports the pain radiates to her right shoulder after eating greasy or fried foods.  She reports if she lays off these foods that she has no issues.  She denies nausea, vomiting, fevers and chills.  She denies jaundice or hepatitis.  She reports her bowel movements are normal.  She has a known gallstone having been identified with serial screening by CT chest imaging.  She has not had an ultrasound.  And the last set of LFTs I have seen were all within normal limits.  Past Medical History Past Medical History:  Diagnosis Date   Cancer of left kidney ([place])    Closed left arm fracture    [facility]    COPD (chronic obstructive pulmonary disease) ([place])    Dyslipidemia    GERD (gastroesophageal reflux disease)    Hypertension 2008   Osteoarthritis    Osteoporosis    Pneumonia 2008   Pre-diabetes    Thyroid nodule    Vitamin D deficiency       Past Surgical History:  Procedure Laterality Date   ABDOMINAL HYSTERECTOMY  1995   BILATERAL SALPINGOOPHORECTOMY  1995   BREAST BIOPSY Left    neg   BREAST SURGERY     Benign per pt's report   CATARACT EXTRACTION  2009   CATARACT EXTRACTION     CESAREAN SECTION  [id number]   COLONOSCOPY  2007   COLONOSCOPY WITH PROPOFOL N/A 08/18/2019   Procedure: COLONOSCOPY WITH PROPOFOL;  Surgeon: Robert Bellow, MD;  Location: [facility];  Service: Endoscopy;  Laterality: N/A;   GANGLION CYST EXCISION     KNEE ARTHROPLASTY Right 06/29/2016   Procedure: COMPUTER ASSISTED TOTAL KNEE ARTHROPLASTY;  Surgeon: [name], MD;  Location: [facility] ORS;  Service: Orthopedics;  Laterality: Right;   KNEE ARTHROSCOPY W/ MENISCAL REPAIR Right 04/2012   [name]   PARTIAL NEPHRECTOMY Left     UTERINE FIBROID SURGERY     x 5    Allergies  Allergen Reactions   Penicillins Hives and Rash    Also drop in BP Has patient had a PCN reaction causing immediate rash, facial/tongue/throat swelling, SOB or lightheadedness with hypotension:Yes Has patient had a PCN reaction causing severe rash involving mucus membranes or skin necrosis:No Has patient had a PCN reaction that required hospitalization:No Has patient had a PCN reaction occurring within the last 10 years:No If all of the above answers are "NO", then may proceed with Cephalosporin use.    Bisphosphonates     Severe GI upset, damaged esophagus.    Current Outpatient Medications  Medication Sig Dispense Refill   amLODipine (NORVASC) 5 MG tablet Take 1 tablet (5 mg total) by mouth daily. 90 tablet 1   Calcium-Magnesium (CAL-MAG PO) Take 2 tablets by mouth at bedtime. Takes occasionally     Cholecalciferol (VITAMIN D3 PO) Take 2,500 Units by mouth at bedtime. Takes when remembers     conjugated estrogens (PREMARIN) vaginal cream PLACE 1 APPLICATORFUL VAGINALLY 2 TIMES A WEEK 30 g 1   COVID-19 mRNA Vac-TriS, Pfizer, (PFIZER-BIONT COVID-19 VAC-TRIS) SUSP injection Inject into the muscle. 0.3 mL 0   Cyanocobalamin (VITAMIN B-12 PO) Take by mouth.     pantoprazole (PROTONIX) 40 MG tablet TAKE  1 TABLET(40 MG) BY MOUTH DAILY 90 tablet 1   pregabalin (LYRICA) 50 MG capsule Take 1 capsule (50 mg total) by mouth at bedtime. Take 1-3 hours prior to bed. 30 capsule 1   triamcinolone (NASACORT) 55 MCG/ACT AERO nasal inhaler Place 2 sprays into the nose as needed.     No current facility-administered medications for this visit.    Family History Family History  Problem Relation Age of Onset   Anemia Mother        aplastic   Aplastic anemia Mother    Throat cancer Father    Hypertension Father    Alcohol abuse Father    COPD Father    Diabetes Sister    Esophageal cancer Brother    Colon polyps Sister    Diabetes Brother     Multiple myeloma Brother    Diabetes Brother    Colon cancer Maternal Aunt    Breast cancer Neg Hx       Social History Social History   Tobacco Use   Smoking status: Former    Packs/day: 2.00    Years: 18.50    Pack years: 37.00    Types: Cigarettes    Quit date: 03/18/2006    Years since quitting: 15.0   Smokeless tobacco: Never  Vaping Use   Vaping Use: Never used  Substance Use Topics   Alcohol use: Yes    Alcohol/week: 0.0 - 2.0 standard drinks    Comment: Occasional glass of wine, sometimes 2 in a week, sometimes 1 every couple of months.   Drug use: No     Review of Systems  Constitutional:  Negative for chills, diaphoresis, fever, malaise/fatigue and weight loss.  HENT:  Positive for hearing loss.   Eyes:  Negative for blurred vision, double vision and pain.  Respiratory:  Positive for shortness of breath and wheezing. Negative for cough, hemoptysis and sputum production.   Cardiovascular:  Positive for leg swelling (Limited to times of travel).  Gastrointestinal:  Negative for abdominal pain, blood in stool, constipation, diarrhea, heartburn (Takes PPI twice daily to keep her GERD under control.), melena, nausea and vomiting.  Genitourinary:  Positive for frequency. Negative for dysuria, flank pain, hematuria and urgency.  Skin:  Positive for itching.  Neurological: Negative.   Psychiatric/Behavioral: Negative.       Physical Exam Blood pressure 136/82, pulse 90, temperature 97.6 F (36.4 C), temperature source Oral, height 4' 11.5" (1.511 m), weight 193 lb 9.6 oz (87.8 kg), SpO2 94 %. Last Weight  Most recent update: 03/18/2021 10:11 AM    Weight  87.8 kg (193 lb 9.6 oz)             CONSTITUTIONAL: Well developed, and nourished, appropriately responsive and aware without distress.   EYES: Sclera non-icteric.   EARS, NOSE, MOUTH AND THROAT: Mask worn.   The oropharynx is clear. Oral mucosa is pink and moist.    Hearing is intact to voice.  NECK:  Trachea is midline, and there is no jugular venous distension.  LYMPH NODES:  Lymph nodes in the neck are not enlarged. RESPIRATORY:  Lungs are clear, and breath sounds are equal bilaterally. Normal respiratory effort without pathologic use of accessory muscles. CARDIOVASCULAR: Heart is regular in rate and rhythm. GI: The abdomen is  soft, nontender, and nondistended. There were no palpable masses. I did not appreciate hepatosplenomegaly. There were normal bowel sounds. MUSCULOSKELETAL:  Symmetrical muscle tone appreciated in all four extremities.    SKIN: Skin turgor  is normal. No pathologic skin lesions appreciated.  NEUROLOGIC:  Motor and sensation appear grossly normal.  Cranial nerves are grossly without defect. PSYCH:  Alert and oriented to person, place and time. Affect is appropriate for situation.  Data Reviewed I have personally reviewed what is currently available of the patient's imaging, recent labs and medical records.   Labs:  CBC Latest Ref Rng & Units 03/14/2021 11/29/2019 04/04/2019  WBC 4.0 - 10.5 K/uL 5.9 6.3 6.2  Hemoglobin 12.0 - 15.0 g/dL 13.3 12.6 13.7  Hematocrit 36.0 - 46.0 % 39.7 38.7 41.6  Platelets 150.0 - 400.0 K/uL 260.0 236.0 265   CMP Latest Ref Rng & Units 03/14/2021 11/29/2019 05/16/2019  Glucose 70 - 99 mg/dL 104(H) 109(H) 112(H)  BUN 6 - 23 mg/dL _0 Creatinine 0.40 - 1.20 mg/dL 0.73 0.76 0.77  Sodium 135 - 145 mEq/L 138 138 136  Potassium 3.5 - 5.1 mEq/L 3.9 4.0 3.5  Chloride 96 - 112 mEq/L 102 104 101  CO2 19 - 32 mEq/L _1 Calcium 8.4 - 10.5 mg/dL 9.4 9.3 9.8  Total Protein 6.0 - 8.3 g/dL 7.2 7.0 7.5  Total Bilirubin 0.2 - 1.2 mg/dL 0.4 0.4 0.5  Alkaline Phos 39 - 117 U/L 59 52 61  AST 0 - 37 U/L _2 ALT 0 - 35 U/L 34 39(H) 26      Imaging: Radiology review:  CLINICAL DATA:  75 year old female with 37 pack-year history of smoking. Lung cancer screening.   EXAM: CT CHEST WITHOUT CONTRAST LOW-DOSE FOR LUNG CANCER SCREENING    TECHNIQUE: Multidetector CT imaging of the chest was performed following the standard protocol without IV contrast.   COMPARISON:  02/06/2019   FINDINGS: Cardiovascular: The heart size is normal. No substantial pericardial effusion. Coronary artery calcification is evident. Atherosclerotic calcification is noted in the wall of the thoracic aorta.   Mediastinum/Nodes: No mediastinal lymphadenopathy. No evidence for gross hilar lymphadenopathy although assessment is limited by the lack of intravenous contrast on today's study. The esophagus has normal imaging features. There is no axillary lymphadenopathy.   Lungs/Pleura: Centrilobular emphsyema noted. Tiny bilateral pulmonary nodules identified previously are stable. No new suspicious nodule or mass. No focal airspace consolidation. No pleural effusion.   Upper Abdomen: Gallstones again evident. Lesion of concern in the upper pole left kidney on previous exam is not been included on today's study.   Musculoskeletal: No worrisome lytic or sclerotic osseous abnormality.   IMPRESSION: 1. Lung-RADS 2, benign appearance or behavior. Continue annual screening with low-dose chest CT without contrast in 12 months. 2. Cholelithiasis. 3. Aortic Atherosclerosis (ICD10-I70.0) and Emphysema (ICD10-J43.9).     Electronically Signed   By: Misty Stanley M.D.   On: 02/08/2020 13:49 Within last 24 hrs: No results found.  Assessment    Chronic calculus cholecystitis with biliary colic  Patient Active Problem List   Diagnosis Date Noted   Calculus of gallbladder without cholecystitis without obstruction 03/14/2021   Urinary frequency 02/26/2020   Dysuria 02/26/2020   Class 2 severe obesity due to excess calories with serious comorbidity and body mass index (BMI) of 36.0 to 36.9 in adult ([place]) 11/29/2019   Chronic pain of left knee 11/29/2019   Abdominal discomfort 11/29/2019   Low vitamin B12 level 08/16/2019   RLS (restless legs  syndrome) 08/16/2019   Vitamin D deficiency 08/16/2019   Sinusitis 06/06/2019   Hip pain 04/05/2019   Overactive bladder 04/05/2019   Cancer of kidney (  [place]) 04/05/2019   Tingling 04/05/2019   Lightheadedness 04/05/2019   Breast pain 07/20/2018   Dyspnea on exertion 07/14/2018   Encounter for general adult medical examination with abnormal findings 08/04/2017   Rib pain 06/23/2017   Right knee pain 12/14/2016   S/P total knee arthroplasty 06/29/2016   Essential hypertension 01/25/2014   Obesity (BMI 30-39.9) 01/25/2014   Atrophic vaginitis 03/24/2012   Thyroid nodule 11/25/2011   Osteopenia 11/25/2011   Hyperlipidemia 11/25/2011   GERD (gastroesophageal reflux disease) 03/19/2011   Allergic rhinitis 03/19/2011    Plan    As she appears to have her symptoms quite well controlled currently, we discussed potential for sequelae to develop. We discussed the importance of her staying on a low-fat diet, will evaluate her gallbladder more thoroughly with an ultrasound, her LFTs are within normal limits.  She would like to defer entertaining surgery until next year.  We will have her follow-up in a month per her request. Face-to-face time spent with the patient and accompanying care providers(if present) was 30 minutes, with more than 50% of the time spent counseling, educating, and coordinating care of the patient.    These notes generated with voice recognition software. I apologize for typographical errors.  Ronny Bacon M.D., FACS 03/18/2021, 10:29 AM

## 2021-03-21 ENCOUNTER — Ambulatory Visit: Payer: Medicare Other

## 2021-03-21 NOTE — Telephone Encounter (Signed)
Spoke with pt and scheduled yearly lung screening CT. Pt advised that she will no longer qualify after this year due to quit smoking over 15 years. Pt verbalized understanding.

## 2021-03-26 ENCOUNTER — Other Ambulatory Visit: Payer: Medicare Other

## 2021-03-26 ENCOUNTER — Encounter: Payer: Medicare Other | Admitting: Licensed Clinical Social Worker

## 2021-04-02 ENCOUNTER — Ambulatory Visit
Admission: RE | Admit: 2021-04-02 | Discharge: 2021-04-02 | Disposition: A | Discharge status: Home / Self Care | Payer: Medicare Other | Source: Ambulatory Visit | Attending: Acute Care | Admitting: Acute Care

## 2021-04-02 ENCOUNTER — Other Ambulatory Visit: Payer: Self-pay

## 2021-04-02 DIAGNOSIS — Z87891 Personal history of nicotine dependence: Secondary | ICD-10-CM | POA: Insufficient documentation

## 2021-04-03 ENCOUNTER — Other Ambulatory Visit: Payer: Self-pay | Admitting: Acute Care

## 2021-04-03 DIAGNOSIS — F17200 Nicotine dependence, unspecified, uncomplicated: Secondary | ICD-10-CM

## 2021-04-03 DIAGNOSIS — Z87891 Personal history of nicotine dependence: Secondary | ICD-10-CM

## 2021-04-08 ENCOUNTER — Other Ambulatory Visit: Payer: Self-pay

## 2021-04-08 ENCOUNTER — Ambulatory Visit
Admission: RE | Admit: 2021-04-08 | Discharge: 2021-04-08 | Disposition: A | Discharge status: Home / Self Care | Payer: Medicare Other | Source: Ambulatory Visit | Attending: Surgery | Admitting: Surgery

## 2021-04-08 DIAGNOSIS — K760 Fatty (change of) liver, not elsewhere classified: Secondary | ICD-10-CM | POA: Diagnosis not present

## 2021-04-08 DIAGNOSIS — K8020 Calculus of gallbladder without cholecystitis without obstruction: Secondary | ICD-10-CM | POA: Diagnosis not present

## 2021-04-09 ENCOUNTER — Encounter: Payer: Self-pay | Admitting: Family Medicine

## 2021-04-10 ENCOUNTER — Inpatient Hospital Stay: Payer: Medicare Other

## 2021-04-10 ENCOUNTER — Inpatient Hospital Stay: Payer: Medicare Other | Admitting: Licensed Clinical Social Worker

## 2021-04-15 ENCOUNTER — Ambulatory Visit: Payer: Medicare Other | Admitting: Surgery

## 2021-04-15 ENCOUNTER — Other Ambulatory Visit: Payer: Self-pay | Admitting: Family

## 2021-04-15 ENCOUNTER — Other Ambulatory Visit: Payer: Self-pay

## 2021-04-15 ENCOUNTER — Ambulatory Visit: Payer: Self-pay | Admitting: Surgery

## 2021-04-15 ENCOUNTER — Encounter: Payer: Self-pay | Admitting: Surgery

## 2021-04-15 VITALS — BP 131/77 | HR 102 | Temp 97.8°F | Ht 59.5 in | Wt 197.8 lb

## 2021-04-15 DIAGNOSIS — K8010 Calculus of gallbladder with chronic cholecystitis without obstruction: Secondary | ICD-10-CM

## 2021-04-15 DIAGNOSIS — K8020 Calculus of gallbladder without cholecystitis without obstruction: Secondary | ICD-10-CM

## 2021-04-15 DIAGNOSIS — J439 Emphysema, unspecified: Secondary | ICD-10-CM

## 2021-04-15 NOTE — Patient Instructions (Addendum)
Our surgery scheduler will call you within 24-48 hours to schedule your surgery. Please have the Blue surgery sheet available when speaking to her.   Cholelithiasis Cholelithiasis is a disease in which gallstones form in the gallbladder. The gallbladder is an organ that stores bile. Bile is a fluid that helps to digest fats. Gallstones begin as small crystals and can slowly grow into stones. They may cause no symptoms until they block the gallbladder duct, or cystic duct, when the gallbladder tightens (contracts) after food is eaten. This can cause pain and is known as a gallbladder attack, or biliary colic. There are two main types of gallstones: Cholesterol stones. These are the most common type of gallstone. These stones are made of hardened cholesterol and are usually yellow-green in color. Cholesterol is a fat-like substance that is made in the liver. Pigment stones. These are dark in color and are made of a red-yellow substance, called bilirubin,that forms when hemoglobin from red blood cells breaks down. What are the causes? This condition may be caused by an imbalance in the different parts that make bile. This can happen if the bile: Has too much bilirubin. This can happen in certain blood diseases, such as sickle cell anemia. Has too much cholesterol. Does not have enough bile salts. These salts help the body absorb and digest fats. In some cases, this condition can also be caused by the gallbladder not emptying completely or often enough. This is common during pregnancy. What increases the risk? The following factors may make you more likely to develop this condition: Being female. Having multiple pregnancies. Health care providers sometimes advise removing diseased gallbladders before future pregnancies. Eating a diet that is heavy in fried foods, fat, and refined carbohydrates, such as white bread and white rice. Being obese. Being older than age 80. Using medicines that contain  female hormones (estrogen) for a long time. Losing weight quickly. Having a family history of gallstones. Having certain medical problems, such as: Diabetes mellitus. Cystic fibrosis. Crohn's disease. Cirrhosis or other long-term (chronic) liver disease. Certain blood diseases, such as sickle cell anemia or leukemia. What are the signs or symptoms? In many cases, having gallstones causes no symptoms. When you have gallstones but do not have symptoms, you have silent gallstones. If a gallstone blocks your bile duct, it can cause a gallbladder attack. The main symptom of a gallbladder attack is sudden pain in the upper right part of the abdomen. The pain: Usually comes at night or after eating. Can last for one hour or more. Can spread to your right shoulder, back, or chest. Can feel like indigestion. This is discomfort, burning, or fullness in your upper abdomen. If the bile duct is blocked for more than a few hours, it can cause an infection or inflammation of your gallbladder (cholecystitis), liver, or pancreas. This can cause: Nausea or vomiting. Bloating. Pain in your abdomen that lasts for 5 hours or longer. Tenderness in your upper abdomen, often in the upper right section and under your rib cage. Fever or chills. Skin or the white parts of your eyes turning yellow (jaundice). This usually happens when a stone has blocked bile from passing through the common bile duct. Dark urine or light-colored stools. How is this diagnosed? This condition may be diagnosed based on: A physical exam. Your medical history. Ultrasound. CT scan. MRI. You may also have other tests, including: Blood tests to check for signs of an infection or inflammation. Cholescintigraphy, or HIDA scan. This is a scan  of your gallbladder and bile ducts (biliary system) using non-harmful radioactive material and special cameras that can see the radioactive material. Endoscopic retrograde cholangiopancreatogram.  This involves inserting a small tube with a camera on the end (endoscope) through your mouth to look at bile ducts and check for blockages. How is this treated? Treatment for this condition depends on the severity of the condition. Silent gallstones do not need treatment. Treatment may be needed if a blockage causes a gallbladder attack or other symptoms. Treatment may include: Home care, if symptoms are not severe. During a simple gallbladder attack, stop eating and drinking for 12-24 hours (except for water and clear liquids). This helps to "cool down" your gallbladder. After 1 or 2 days, you can start to eat a diet of simple or clear foods, such as broths and crackers. You may also need medicines for pain or nausea or both. If you have cholecystitis and an infection, you will need antibiotics. A hospital stay, if needed for pain control or for cholecystitis with severe infection. Cholecystectomy, or surgery to remove your gallbladder. This is the most common treatment if all other treatments have not worked. Medicines to break up gallstones. These are most effective at treating small gallstones. Medicines may be used for up to 6-12 months. Endoscopic retrograde cholangiopancreatogram. A small basket can be attached to the endoscope and used to capture and remove gallstones, mainly those that are in the common bile duct. Follow these instructions at home: Medicines Take over-the-counter and prescription medicines only as told by your health care provider. If you were prescribed an antibiotic medicine, take it as told by your health care provider. Do not stop taking the antibiotic even if you start to feel better. Ask your health care provider if the medicine prescribed to you requires you to avoid driving or using machinery. Eating and drinking Drink enough fluid to keep your urine pale yellow. This is important during a gallbladder attack. Water and clear liquids are preferred. Follow a healthy  diet. This includes: Reducing fatty foods, such as fried food and foods high in cholesterol. Reducing refined carbohydrates, such as white bread and white rice. Eating more fiber. Aim for foods such as almonds, fruit, and beans. Alcohol use If you drink alcohol: Limit how much you use to: 0-1 drink a day for nonpregnant women. 0-2 drinks a day for men. Be aware of how much alcohol is in your drink. In the [place] one drink equals one 12 oz bottle of beer (355 mL), one 5 oz glass of wine (148 mL), or one 1 oz glass of hard liquor (44 mL). General instructions Do not use any products that contain nicotine or tobacco, such as cigarettes, e-cigarettes, and chewing tobacco. If you need help quitting, ask your health care provider. Maintain a healthy weight. Keep all follow-up visits as told by your health care provider. These may include consultations with a surgeon or specialist. This is important. Where to find more information [employer] of Diabetes and Digestive and Kidney Diseases: [email or web address] Contact a health care provider if: You think you have had a gallbladder attack. You have been diagnosed with silent gallstones and you develop pain in your abdomen or indigestion. You begin to have attacks more often. You have dark urine or light-colored stools. Get help right away if: You have pain from a gallbladder attack that lasts for more than 2 hours. You have pain in your abdomen that lasts for more than 5 hours or is getting worse. You  have a fever or chills. You have nausea and vomiting that do not go away. You develop jaundice. Summary Cholelithiasis is a disease in which gallstones form in the gallbladder. This condition may be caused by an imbalance in the different parts that make bile. This can happen if your bile has too much bilirubin or cholesterol, or does not have enough bile salts. Treatment for gallstones depends on the severity of the condition. Silent  gallstones do not need treatment. If gallstones cause a gallbladder attack or other symptoms, treatment usually involves not eating or drinking anything. Treatment may also include pain medicines and antibiotics, and it sometimes includes a hospital stay. Surgery to remove the gallbladder is common if all other treatments have not worked. This information is not intended to replace advice given to you by your health care provider. Make sure you discuss any questions you have with your health care provider. Document Revised: 02/13/2019 Document Reviewed: 02/13/2019 Elsevier Patient Education  2022 [employer].

## 2021-04-15 NOTE — H&P (View-Only) (Signed)
Patient ID: Amber Perez, female   DOB: Jul 07, 1945, 76 y.o.   MRN: [id number]  Chief Complaint: Right costal pain  History of Present Illness She returns today in follow-up after her limited right upper quadrant ultrasound confirms presence of a large gallstone, otherwise uncomplicated.  She would like to proceed with cholecystectomy. Amber Perez is a 76 y.o. female with a history of bilateral costal pain, seemingly aggravated by eating.  She reports the pain radiates to her right shoulder after eating greasy or fried foods.  She reports if she lays off these foods that she has no issues.  She denies nausea, vomiting, fevers and chills.  She denies jaundice or hepatitis.  She reports her bowel movements are normal.  She has a known gallstone having been identified with serial screening by CT chest imaging.  She has not had an ultrasound.  And the last set of LFTs I have seen were all within normal limits.  Past Medical History Past Medical History:  Diagnosis Date   Cancer of left kidney ([place])    Closed left arm fracture    [facility]    COPD (chronic obstructive pulmonary disease) ([place])    Dyslipidemia    GERD (gastroesophageal reflux disease)    Hypertension 2008   Osteoarthritis    Osteoporosis    Pneumonia 2008   Pre-diabetes    Thyroid nodule    Vitamin D deficiency       Past Surgical History:  Procedure Laterality Date   ABDOMINAL HYSTERECTOMY  1995   BILATERAL SALPINGOOPHORECTOMY  1995   BREAST BIOPSY Left    neg   BREAST SURGERY     Benign per pt's report   CATARACT EXTRACTION  2009   CATARACT EXTRACTION     CESAREAN SECTION  0233,[id number]   COLONOSCOPY  2007   COLONOSCOPY WITH PROPOFOL N/A 08/18/2019   Procedure: COLONOSCOPY WITH PROPOFOL;  Surgeon: Robert Bellow, MD;  Location: [facility];  Service: Endoscopy;  Laterality: N/A;   GANGLION CYST EXCISION     KNEE ARTHROPLASTY Right 06/29/2016   Procedure: COMPUTER ASSISTED TOTAL KNEE  ARTHROPLASTY;  Surgeon: [name], MD;  Location: [facility];  Service: Orthopedics;  Laterality: Right;   KNEE ARTHROSCOPY W/ MENISCAL REPAIR Right 04/2012   [name]   PARTIAL NEPHRECTOMY Left    UTERINE FIBROID SURGERY     x 5    Allergies  Allergen Reactions   Penicillins Hives and Rash    Also drop in BP Has patient had a PCN reaction causing immediate rash, facial/tongue/throat swelling, SOB or lightheadedness with hypotension:Yes Has patient had a PCN reaction causing severe rash involving mucus membranes or skin necrosis:No Has patient had a PCN reaction that required hospitalization:No Has patient had a PCN reaction occurring within the last 10 years:No If all of the above answers are "NO", then may proceed with Cephalosporin use.    Bisphosphonates     Severe GI upset, damaged esophagus.    Current Outpatient Medications  Medication Sig Dispense Refill   amLODipine (NORVASC) 5 MG tablet Take 1 tablet (5 mg total) by mouth daily. 90 tablet 1   Calcium-Magnesium (CAL-MAG PO) Take 2 tablets by mouth at bedtime. Takes occasionally     Cholecalciferol (VITAMIN D3 PO) Take 2,500 Units by mouth at bedtime. Takes when remembers     conjugated estrogens (PREMARIN) vaginal cream PLACE 1 APPLICATORFUL VAGINALLY 2 TIMES A WEEK 30 g 1   COVID-19 mRNA Vac-TriS, Pfizer, (PFIZER-BIONT COVID-19 VAC-TRIS)  SUSP injection Inject into the muscle. 0.3 mL 0   Cyanocobalamin (VITAMIN B-12 PO) Take by mouth.     pantoprazole (PROTONIX) 40 MG tablet TAKE 1 TABLET(40 MG) BY MOUTH DAILY 90 tablet 1   pregabalin (LYRICA) 50 MG capsule Take 1 capsule (50 mg total) by mouth at bedtime. Take 1-3 hours prior to bed. 30 capsule 1   triamcinolone (NASACORT) 55 MCG/ACT AERO nasal inhaler Place 2 sprays into the nose as needed.     No current facility-administered medications for this visit.    Family History Family History  Problem Relation Age of Onset   Anemia Mother        aplastic   Aplastic  anemia Mother    Throat cancer Father    Hypertension Father    Alcohol abuse Father    COPD Father    Diabetes Sister    Esophageal cancer Brother    Colon polyps Sister    Diabetes Brother    Multiple myeloma Brother    Diabetes Brother    Colon cancer Maternal Aunt    Breast cancer Neg Hx       Social History Social History   Tobacco Use   Smoking status: Former    Packs/day: 2.00    Years: 18.50    Pack years: 37.00    Types: Cigarettes    Quit date: 03/18/2006    Years since quitting: 15.0   Smokeless tobacco: Never  Vaping Use   Vaping Use: Never used  Substance Use Topics   Alcohol use: Yes    Alcohol/week: 0.0 - 2.0 standard drinks    Comment: Occasional glass of wine, sometimes 2 in a week, sometimes 1 every couple of months.   Drug use: No     Review of Systems  Constitutional:  Negative for chills, diaphoresis, fever, malaise/fatigue and weight loss.  HENT:  Positive for hearing loss.   Eyes:  Negative for blurred vision, double vision and pain.  Respiratory:  Positive for shortness of breath and wheezing. Negative for cough, hemoptysis and sputum production.   Cardiovascular:  Positive for orthopnea. Leg swelling: Limited to times of travel. Gastrointestinal:  Positive for abdominal pain. Negative for blood in stool, constipation, diarrhea, heartburn (Takes PPI twice daily to keep her GERD under control.), melena, nausea and vomiting.  Genitourinary:  Positive for frequency. Negative for dysuria, flank pain, hematuria and urgency.  Skin:  Positive for itching.  Neurological: Negative.   Psychiatric/Behavioral: Negative.       Physical Exam Blood pressure 131/77, pulse (!) 102, temperature 97.8 F (36.6 C), temperature source Oral, height 4' 11.5" (1.511 m), weight 197 lb 12.8 oz (89.7 kg), SpO2 96 %. Last Weight  Most recent update: 04/15/2021  2:49 PM    Weight  89.7 kg (197 lb 12.8 oz)             CONSTITUTIONAL: Well developed, and  nourished, appropriately responsive and aware without distress.   EYES: Sclera non-icteric.   EARS, NOSE, MOUTH AND THROAT: Mask worn.   The oropharynx is clear. Oral mucosa is pink and moist.    Hearing is intact to voice.  NECK: Trachea is midline, and there is no jugular venous distension.  LYMPH NODES:  Lymph nodes in the neck are not enlarged. RESPIRATORY:  Lungs are clear, and breath sounds are equal bilaterally. Normal respiratory effort without pathologic use of accessory muscles. CARDIOVASCULAR: Heart is regular in rate and rhythm. GI: The abdomen is  soft, nontender, and  nondistended. There were no palpable masses. I did not appreciate hepatosplenomegaly. There were normal bowel sounds. MUSCULOSKELETAL:  Symmetrical muscle tone appreciated in all four extremities.    SKIN: Skin turgor is normal. No pathologic skin lesions appreciated.  NEUROLOGIC:  Motor and sensation appear grossly normal.  Cranial nerves are grossly without defect. PSYCH:  Alert and oriented to person, place and time. Affect is appropriate for situation.  Data Reviewed I have personally reviewed what is currently available of the patient's imaging, recent labs and medical records.   Labs:  CBC Latest Ref Rng & Units 03/14/2021 11/29/2019 04/04/2019  WBC 4.0 - 10.5 K/uL 5.9 6.3 6.2  Hemoglobin 12.0 - 15.0 g/dL 13.3 12.6 13.7  Hematocrit 36.0 - 46.0 % 39.7 38.7 41.6  Platelets 150.0 - 400.0 K/uL 260.0 236.0 265   CMP Latest Ref Rng & Units 03/14/2021 11/29/2019 05/16/2019  Glucose 70 - 99 mg/dL 104(H) 109(H) 112(H)  BUN 6 - 23 mg/dL '16 16 20  ' Creatinine 0.40 - 1.20 mg/dL 0.73 0.76 0.77  Sodium 135 - 145 mEq/L 138 138 136  Potassium 3.5 - 5.1 mEq/L 3.9 4.0 3.5  Chloride 96 - 112 mEq/L 102 104 101  CO2 19 - 32 mEq/L '26 27 28  ' Calcium 8.4 - 10.5 mg/dL 9.4 9.3 9.8  Total Protein 6.0 - 8.3 g/dL 7.2 7.0 7.5  Total Bilirubin 0.2 - 1.2 mg/dL 0.4 0.4 0.5  Alkaline Phos 39 - 117 U/L 59 52 61  AST 0 - 37 U/L '26 30 20  ' ALT  0 - 35 U/L 34 39(H) 26      Imaging: Radiology review:  CLINICAL DATA:  76 year old female with 37 pack-year history of smoking. Lung cancer screening.   EXAM: CT CHEST WITHOUT CONTRAST LOW-DOSE FOR LUNG CANCER SCREENING   TECHNIQUE: Multidetector CT imaging of the chest was performed following the standard protocol without IV contrast.   COMPARISON:  02/06/2019   FINDINGS: Cardiovascular: The heart size is normal. No substantial pericardial effusion. Coronary artery calcification is evident. Atherosclerotic calcification is noted in the wall of the thoracic aorta.   Mediastinum/Nodes: No mediastinal lymphadenopathy. No evidence for gross hilar lymphadenopathy although assessment is limited by the lack of intravenous contrast on today's study. The esophagus has normal imaging features. There is no axillary lymphadenopathy.   Lungs/Pleura: Centrilobular emphsyema noted. Tiny bilateral pulmonary nodules identified previously are stable. No new suspicious nodule or mass. No focal airspace consolidation. No pleural effusion.   Upper Abdomen: Gallstones again evident. Lesion of concern in the upper pole left kidney on previous exam is not been included on today's study.   Musculoskeletal: No worrisome lytic or sclerotic osseous abnormality.   IMPRESSION: 1. Lung-RADS 2, benign appearance or behavior. Continue annual screening with low-dose chest CT without contrast in 12 months. 2. Cholelithiasis. 3. Aortic Atherosclerosis (ICD10-I70.0) and Emphysema (ICD10-J43.9).     Electronically Signed   By: Misty Stanley M.D.   On: 02/08/2020 13:49 Within last 24 hrs: No results found.   CLINICAL DATA:  Gallbladder stones   EXAM: ULTRASOUND ABDOMEN LIMITED RIGHT UPPER QUADRANT   COMPARISON:  None.   FINDINGS: Gallbladder:   There is 2.5 cm calculus in the lumen of the gallbladder. Technologist did not observe any tenderness over the gallbladder. There is no wall  thickening in gallbladder. There is no fluid around the gallbladder.   Common bile duct:   Diameter: 3.5 mm   Liver:   There is increased echogenicity suggesting fatty infiltration. There is subtle  ill-defined area of decreased echogenicity in the liver close to the gallbladder fossa, possibly sparing from fatty infiltration. Portal vein is patent on color Doppler imaging with normal direction of blood flow towards the liver.   Other: None.   IMPRESSION: Large gallbladder stone. There are no signs of acute cholecystitis. Fatty liver.     Electronically Signed   By: Elmer Picker M.D.   On: 04/08/2021 12:02   Assessment    Patient Active Problem List   Diagnosis Date Noted   Calculus of gallbladder without cholecystitis without obstruction 03/14/2021   Urinary frequency 02/26/2020   Dysuria 02/26/2020   Class 2 severe obesity due to excess calories with serious comorbidity and body mass index (BMI) of 36.0 to 36.9 in adult [facility]) 11/29/2019   Chronic pain of left knee 11/29/2019   Abdominal discomfort 11/29/2019   Low vitamin B12 level 08/16/2019   RLS (restless legs syndrome) 08/16/2019   Vitamin D deficiency 08/16/2019   Sinusitis 06/06/2019   Hip pain 04/05/2019   Overactive bladder 04/05/2019   Cancer of kidney ([place]) 04/05/2019   Tingling 04/05/2019   Lightheadedness 04/05/2019   Breast pain 07/20/2018   Dyspnea on exertion 07/14/2018   Encounter for general adult medical examination with abnormal findings 08/04/2017   Rib pain 06/23/2017   Right knee pain 12/14/2016   S/P total knee arthroplasty 06/29/2016   Essential hypertension 01/25/2014   Obesity (BMI 30-39.9) 01/25/2014   Atrophic vaginitis 03/24/2012   Thyroid nodule 11/25/2011   Osteopenia 11/25/2011   Hyperlipidemia 11/25/2011   GERD (gastroesophageal reflux disease) 03/19/2011   Allergic rhinitis 03/19/2011    Plan    Robotic cholecystectomy with ICG imaging.  This patient was seen  and examined, and I concur with the H&P associated with this note.  This was discussed thoroughly.  Optimal plan is for robotic cholecystectomy.  Risks and benefits have been discussed with the patient which include but are not limited to anesthesia, bleeding, infection, biliary ductal injury or stenosis, other associated unanticipated injuries affiliated with laparoscopic surgery.  I believe there is the desire to proceed, interpreter utilized as needed.  Questions elicited and answered to satisfaction.  No guarantees ever expressed or implied.  Face-to-face time spent with the patient and accompanying care providers(if present) was 30 minutes, with more than 50% of the time spent counseling, educating, and coordinating care of the patient.    These notes generated with voice recognition software. I apologize for typographical errors.  Ronny Bacon M.D., FACS 04/15/2021, 3:17 PM

## 2021-04-15 NOTE — Progress Notes (Signed)
Patient ID: Amber Perez, female   DOB: 1945-07-29, 76 y.o.   MRN: [id number]  Chief Complaint: Right costal pain  History of Present Illness She returns today in follow-up after her limited right upper quadrant ultrasound confirms presence of a large gallstone, otherwise uncomplicated.  She would like to proceed with cholecystectomy. Amber Perez is a 76 y.o. female with a history of bilateral costal pain, seemingly aggravated by eating.  She reports the pain radiates to her right shoulder after eating greasy or fried foods.  She reports if she lays off these foods that she has no issues.  She denies nausea, vomiting, fevers and chills.  She denies jaundice or hepatitis.  She reports her bowel movements are normal.  She has a known gallstone having been identified with serial screening by CT chest imaging.  She has not had an ultrasound.  And the last set of LFTs I have seen were all within normal limits.  Past Medical History Past Medical History:  Diagnosis Date   Cancer of left kidney ([place])    Closed left arm fracture    [facility]    COPD (chronic obstructive pulmonary disease) ([place])    Dyslipidemia    GERD (gastroesophageal reflux disease)    Hypertension 2008   Osteoarthritis    Osteoporosis    Pneumonia 2008   Pre-diabetes    Thyroid nodule    Vitamin D deficiency       Past Surgical History:  Procedure Laterality Date   ABDOMINAL HYSTERECTOMY  1995   BILATERAL SALPINGOOPHORECTOMY  1995   BREAST BIOPSY Left    neg   BREAST SURGERY     Benign per pt's report   CATARACT EXTRACTION  2009   CATARACT EXTRACTION     CESAREAN SECTION  [id number]   COLONOSCOPY  2007   COLONOSCOPY WITH PROPOFOL N/A 08/18/2019   Procedure: COLONOSCOPY WITH PROPOFOL;  Surgeon: Robert Bellow, MD;  Location: [facility] END[facility]COPY;  Service: Endoscopy;  Laterality: N/A;   GANGLION CYST EXCISION     KNEE ARTHROPLASTY Right 06/29/2016   Procedure: COMPUTER ASSISTED TOTAL KNEE  ARTHROPLASTY;  Surgeon: [name], MD;  Location: [facility] ORS;  Service: Orthopedics;  Laterality: Right;   KNEE ARTHROSCOPY W/ MENISCAL REPAIR Right 04/2012   [name]   PARTIAL NEPHRECTOMY Left    UTERINE FIBROID SURGERY     x 5    Allergies  Allergen Reactions   Penicillins Hives and Rash    Also drop in BP Has patient had a PCN reaction causing immediate rash, facial/tongue/throat swelling, SOB or lightheadedness with hypotension:Yes Has patient had a PCN reaction causing severe rash involving mucus membranes or skin necrosis:No Has patient had a PCN reaction that required hospitalization:No Has patient had a PCN reaction occurring within the last 10 years:No If all of the above answers are "NO", then may proceed with Cephalosporin use.    Bisphosphonates     Severe GI upset, damaged esophagus.    Current Outpatient Medications  Medication Sig Dispense Refill   amLODipine (NORVASC) 5 MG tablet Take 1 tablet (5 mg total) by mouth daily. 90 tablet 1   Calcium-Magnesium (CAL-MAG PO) Take 2 tablets by mouth at bedtime. Takes occasionally     Cholecalciferol (VITAMIN D3 PO) Take 2,500 Units by mouth at bedtime. Takes when remembers     conjugated estrogens (PREMARIN) vaginal cream PLACE 1 APPLICATORFUL VAGINALLY 2 TIMES A WEEK 30 g 1   COVID-19 mRNA Vac-TriS, Pfizer, (PFIZER-BIONT COVID-19 VAC-TRIS)  SUSP injection Inject into the muscle. 0.3 mL 0   Cyanocobalamin (VITAMIN B-12 PO) Take by mouth.     pantoprazole (PROTONIX) 40 MG tablet TAKE 1 TABLET(40 MG) BY MOUTH DAILY 90 tablet 1   pregabalin (LYRICA) 50 MG capsule Take 1 capsule (50 mg total) by mouth at bedtime. Take 1-3 hours prior to bed. 30 capsule 1   triamcinolone (NASACORT) 55 MCG/ACT AERO nasal inhaler Place 2 sprays into the nose as needed.     No current facility-administered medications for this visit.    Family History Family History  Problem Relation Age of Onset   Anemia Mother        aplastic   Aplastic  anemia Mother    Throat cancer Father    Hypertension Father    Alcohol abuse Father    COPD Father    Diabetes Sister    Esophageal cancer Brother    Colon polyps Sister    Diabetes Brother    Multiple myeloma Brother    Diabetes Brother    Colon cancer Maternal Aunt    Breast cancer Neg Hx       Social History Social History   Tobacco Use   Smoking status: Former    Packs/day: 2.00    Years: 18.50    Pack years: 37.00    Types: Cigarettes    Quit date: 03/18/2006    Years since quitting: 15.0   Smokeless tobacco: Never  Vaping Use   Vaping Use: Never used  Substance Use Topics   Alcohol use: Yes    Alcohol/week: 0.0 - 2.0 standard drinks    Comment: Occasional glass of wine, sometimes 2 in a week, sometimes 1 every couple of months.   Drug use: No     Review of Systems  Constitutional:  Negative for chills, diaphoresis, fever, malaise/fatigue and weight loss.  HENT:  Positive for hearing loss.   Eyes:  Negative for blurred vision, double vision and pain.  Respiratory:  Positive for shortness of breath and wheezing. Negative for cough, hemoptysis and sputum production.   Cardiovascular:  Positive for orthopnea. Leg swelling: Limited to times of travel. Gastrointestinal:  Positive for abdominal pain. Negative for blood in stool, constipation, diarrhea, heartburn (Takes PPI twice daily to keep her GERD under control.), melena, nausea and vomiting.  Genitourinary:  Positive for frequency. Negative for dysuria, flank pain, hematuria and urgency.  Skin:  Positive for itching.  Neurological: Negative.   Psychiatric/Behavioral: Negative.       Physical Exam Blood pressure 131/77, pulse (!) 102, temperature 97.8 F (36.6 C), temperature source Oral, height 4' 11.5" (1.511 m), weight 197 lb 12.8 oz (89.7 kg), SpO2 96 %. Last Weight  Most recent update: 04/15/2021  2:49 PM    Weight  89.7 kg (197 lb 12.8 oz)             CONSTITUTIONAL: Well developed, and  nourished, appropriately responsive and aware without distress.   EYES: Sclera non-icteric.   EARS, NOSE, MOUTH AND THROAT: Mask worn.   The oropharynx is clear. Oral mucosa is pink and moist.    Hearing is intact to voice.  NECK: Trachea is midline, and there is no jugular venous distension.  LYMPH NODES:  Lymph nodes in the neck are not enlarged. RESPIRATORY:  Lungs are clear, and breath sounds are equal bilaterally. Normal respiratory effort without pathologic use of accessory muscles. CARDIOVASCULAR: Heart is regular in rate and rhythm. GI: The abdomen is  soft, nontender, and  nondistended. There were no palpable masses. I did not appreciate hepatosplenomegaly. There were normal bowel sounds. MUSCULOSKELETAL:  Symmetrical muscle tone appreciated in all four extremities.    SKIN: Skin turgor is normal. No pathologic skin lesions appreciated.  NEUROLOGIC:  Motor and sensation appear grossly normal.  Cranial nerves are grossly without defect. PSYCH:  Alert and oriented to person, place and time. Affect is appropriate for situation.  Data Reviewed I have personally reviewed what is currently available of the patient's imaging, recent labs and medical records.   Labs:  CBC Latest Ref Rng & Units 03/14/2021 11/29/2019 04/04/2019  WBC 4.0 - 10.5 K/uL 5.9 6.3 6.2  Hemoglobin 12.0 - 15.0 g/dL 13.3 12.6 13.7  Hematocrit 36.0 - 46.0 % 39.7 38.7 41.6  Platelets 150.0 - 400.0 K/uL 260.0 236.0 265   CMP Latest Ref Rng & Units 03/14/2021 11/29/2019 05/16/2019  Glucose 70 - 99 mg/dL 104(H) 109(H) 112(H)  BUN 6 - 23 mg/dL '16 16 20  ' Creatinine 0.40 - 1.20 mg/dL 0.73 0.76 0.77  Sodium 135 - 145 mEq/L 138 138 136  Potassium 3.5 - 5.1 mEq/L 3.9 4.0 3.5  Chloride 96 - 112 mEq/L 102 104 101  CO2 19 - 32 mEq/L '26 27 28  ' Calcium 8.4 - 10.5 mg/dL 9.4 9.3 9.8  Total Protein 6.0 - 8.3 g/dL 7.2 7.0 7.5  Total Bilirubin 0.2 - 1.2 mg/dL 0.4 0.4 0.5  Alkaline Phos 39 - 117 U/L 59 52 61  AST 0 - 37 U/L '26 30 20  ' ALT  0 - 35 U/L 34 39(H) 26      Imaging: Radiology review:  CLINICAL DATA:  76 year old female with 37 pack-year history of smoking. Lung cancer screening.   EXAM: CT CHEST WITHOUT CONTRAST LOW-DOSE FOR LUNG CANCER SCREENING   TECHNIQUE: Multidetector CT imaging of the chest was performed following the standard protocol without IV contrast.   COMPARISON:  02/06/2019   FINDINGS: Cardiovascular: The heart size is normal. No substantial pericardial effusion. Coronary artery calcification is evident. Atherosclerotic calcification is noted in the wall of the thoracic aorta.   Mediastinum/Nodes: No mediastinal lymphadenopathy. No evidence for gross hilar lymphadenopathy although assessment is limited by the lack of intravenous contrast on today's study. The esophagus has normal imaging features. There is no axillary lymphadenopathy.   Lungs/Pleura: Centrilobular emphsyema noted. Tiny bilateral pulmonary nodules identified previously are stable. No new suspicious nodule or mass. No focal airspace consolidation. No pleural effusion.   Upper Abdomen: Gallstones again evident. Lesion of concern in the upper pole left kidney on previous exam is not been included on today's study.   Musculoskeletal: No worrisome lytic or sclerotic osseous abnormality.   IMPRESSION: 1. Lung-RADS 2, benign appearance or behavior. Continue annual screening with low-dose chest CT without contrast in 12 months. 2. Cholelithiasis. 3. Aortic Atherosclerosis (ICD10-I70.0) and Emphysema (ICD10-J43.9).     Electronically Signed   By: Misty Stanley M.D.   On: 02/08/2020 13:49 Within last 24 hrs: No results found.   CLINICAL DATA:  Gallbladder stones   EXAM: ULTRASOUND ABDOMEN LIMITED RIGHT UPPER QUADRANT   COMPARISON:  None.   FINDINGS: Gallbladder:   There is 2.5 cm calculus in the lumen of the gallbladder. Technologist did not observe any tenderness over the gallbladder. There is no wall  thickening in gallbladder. There is no fluid around the gallbladder.   Common bile duct:   Diameter: 3.5 mm   Liver:   There is increased echogenicity suggesting fatty infiltration. There is subtle  ill-defined area of decreased echogenicity in the liver close to the gallbladder fossa, possibly sparing from fatty infiltration. Portal vein is patent on color Doppler imaging with normal direction of blood flow towards the liver.   Other: None.   IMPRESSION: Large gallbladder stone. There are no signs of acute cholecystitis. Fatty liver.     Electronically Signed   By: Elmer Picker M.D.   On: 04/08/2021 12:02   Assessment    Patient Active Problem List   Diagnosis Date Noted   Calculus of gallbladder without cholecystitis without obstruction 03/14/2021   Urinary frequency 02/26/2020   Dysuria 02/26/2020   Class 2 severe obesity due to excess calories with serious comorbidity and body mass index (BMI) of 36.0 to 36.9 in adult [facility]) 11/29/2019   Chronic pain of left knee 11/29/2019   Abdominal discomfort 11/29/2019   Low vitamin B12 level 08/16/2019   RLS (restless legs syndrome) 08/16/2019   Vitamin D deficiency 08/16/2019   Sinusitis 06/06/2019   Hip pain 04/05/2019   Overactive bladder 04/05/2019   Cancer of kidney ([place]) 04/05/2019   Tingling 04/05/2019   Lightheadedness 04/05/2019   Breast pain 07/20/2018   Dyspnea on exertion 07/14/2018   Encounter for general adult medical examination with abnormal findings 08/04/2017   Rib pain 06/23/2017   Right knee pain 12/14/2016   S/P total knee arthroplasty 06/29/2016   Essential hypertension 01/25/2014   Obesity (BMI 30-39.9) 01/25/2014   Atrophic vaginitis 03/24/2012   Thyroid nodule 11/25/2011   Osteopenia 11/25/2011   Hyperlipidemia 11/25/2011   GERD (gastroesophageal reflux disease) 03/19/2011   Allergic rhinitis 03/19/2011    Plan    Robotic cholecystectomy with ICG imaging.  This patient was seen  and examined, and I concur with the H&P associated with this note.  This was discussed thoroughly.  Optimal plan is for robotic cholecystectomy.  Risks and benefits have been discussed with the patient which include but are not limited to anesthesia, bleeding, infection, biliary ductal injury or stenosis, other associated unanticipated injuries affiliated with laparoscopic surgery.  I believe there is the desire to proceed, interpreter utilized as needed.  Questions elicited and answered to satisfaction.  No guarantees ever expressed or implied.  Face-to-face time spent with the patient and accompanying care providers(if present) was 30 minutes, with more than 50% of the time spent counseling, educating, and coordinating care of the patient.    These notes generated with voice recognition software. I apologize for typographical errors.  Ronny Bacon M.D., FACS 04/15/2021, 3:17 PM

## 2021-04-15 NOTE — Telephone Encounter (Signed)
Patient has questions for CT ordered by Groce NP 04/02/21 from lung cancer screening clinic impression pasted below .    IMPRESSION: 1. Lung-RADS 2, benign appearance or behavior. Continue annual screening with low-dose chest CT without contrast in 12 months. 2. Diffuse bronchial wall thickening with emphysema, as above; imaging findings suggestive of underlying COPD. 3. Gallstone. 4. Aortic Atherosclerosis (ICD10-I70.0) and Emphysema (ICD10-J43.9).  Here is the impression from the year prior 02/08/20  IMPRESSION: 1. Lung-RADS 2, benign appearance or behavior. Continue annual screening with low-dose chest CT without contrast in 12 months. 2. Cholelithiasis. 3. Aortic Atherosclerosis (ICD10-I70.0) and Emphysema (ICD10-J43.9).  Patient asking about the atherosclerosis and Emphysema?

## 2021-04-16 ENCOUNTER — Telehealth: Payer: Self-pay | Admitting: Surgery

## 2021-04-16 NOTE — Telephone Encounter (Signed)
Patient has been advised of Pre-Admission date/time, COVID Testing date and Surgery date.  Surgery Date: 04/28/21 Preadmission Testing Date: 04/22/21 (phone 8a-1p) Covid Testing Date: Not needed.     Patient has been made aware to call [phone], between 1-3:00pm the day before surgery, to find out what time to arrive for surgery.

## 2021-04-22 ENCOUNTER — Other Ambulatory Visit: Payer: Self-pay

## 2021-04-22 ENCOUNTER — Ambulatory Visit: Payer: Medicare Other | Admitting: Surgery

## 2021-04-22 ENCOUNTER — Encounter
Admission: RE | Admit: 2021-04-22 | Discharge: 2021-04-22 | Disposition: A | Discharge status: Home / Self Care | Payer: Medicare Other | Source: Ambulatory Visit | Attending: Surgery | Admitting: Surgery

## 2021-04-22 HISTORY — DX: Obesity, unspecified: E66.9

## 2021-04-22 NOTE — Patient Instructions (Addendum)
Your procedure is scheduled on: Monday, January 23 Report to the Registration Desk on the 1st floor of the [facility]. To find out your arrival time, please call [phone] between 1PM - 3PM on: Friday, January 20  REMEMBER: Instructions that are not followed completely may result in serious medical risk, up to and including death; or upon the discretion of your surgeon and anesthesiologist your surgery may need to be rescheduled.  Do not eat food after midnight the night before surgery.  No gum chewing, lozengers or hard candies.  You may however, drink CLEAR liquids up to 2 hours before you are scheduled to arrive for your surgery. Do not drink anything within 2 hours of your scheduled arrival time.  Clear liquids include: - water  - apple juice without pulp - gatorade (not RED, PURPLE, OR BLUE) - black coffee or tea (Do NOT add milk or creamers to the coffee or tea) Do NOT drink anything that is not on this list.  TAKE THESE MEDICATIONS THE MORNING OF SURGERY WITH A SIP OF WATER:  Amlodipine Pantoprazole (Protonix) - (take one the night before and one on the morning of surgery - helps to prevent nausea after surgery.)  One week prior to surgery: Stop Anti-inflammatories (NSAIDS) such as Advil, Aleve, Ibuprofen, Motrin, Naproxen, Naprosyn and Aspirin based products such as Excedrin, Goodys Powder, BC Powder. Stop ANY OVER THE COUNTER supplements until after surgery. You may however, continue to take Tylenol if needed for pain up until the day of surgery.  No Alcohol for 24 hours before or after surgery.  No Smoking including e-cigarettes for 24 hours prior to surgery.  No chewable tobacco products for at least 6 hours prior to surgery.  No nicotine patches on the day of surgery.  Do not use any "recreational" drugs for at least a week prior to your surgery.  Please be advised that the combination of cocaine and anesthesia may have negative outcomes, up to and including  death. If you test positive for cocaine, your surgery will be cancelled.  On the morning of surgery brush your teeth with toothpaste and water, you may rinse your mouth with mouthwash if you wish. Do not swallow any toothpaste or mouthwash.  Use CHG Soap as directed on instruction sheet.  Do not wear jewelry, make-up, hairpins, clips or nail polish.  Do not wear lotions, powders, or perfumes.   Do not shave body from the neck down 48 hours prior to surgery just in case you cut yourself which could leave a site for infection.  Also, freshly shaved skin may become irritated if using the CHG soap.  Contact lenses, hearing aids and dentures may not be worn into surgery.  Do not bring valuables to the hospital. [facility] is not responsible for any missing/lost belongings or valuables.   Notify your doctor if there is any change in your medical condition (cold, fever, infection).  Wear comfortable clothing (specific to your surgery type) to the hospital.  After surgery, you can help prevent lung complications by doing breathing exercises.  Take deep breaths and cough every 1-2 hours. Your doctor may order a device called an Incentive Spirometer to help you take deep breaths. When coughing or sneezing, hold a pillow firmly against your incision with both hands. This is called splinting. Doing this helps protect your incision. It also decreases belly discomfort.  If you are being discharged the day of surgery, you will not be allowed to drive home. You will need  a responsible adult (18 years or older) to drive you home and stay with you that night.   If you are taking public transportation, you will need to have a responsible adult (18 years or older) with you. Please confirm with your physician that it is acceptable to use public transportation.   Please call the [facility] at [phone] if you have any questions about these instructions.  Surgery Visitation  Policy:  Patients undergoing a surgery or procedure may have one family member or support person with them as long as that person is not COVID-19 positive or experiencing its symptoms.  That person may remain in the waiting area during the procedure and may rotate out with other people

## 2021-04-23 ENCOUNTER — Encounter
Admission: RE | Admit: 2021-04-23 | Discharge: 2021-04-23 | Disposition: A | Discharge status: Home / Self Care | Payer: Medicare Other | Source: Ambulatory Visit | Attending: Surgery | Admitting: Surgery

## 2021-04-23 DIAGNOSIS — Z01818 Encounter for other preprocedural examination: Secondary | ICD-10-CM | POA: Insufficient documentation

## 2021-04-23 DIAGNOSIS — K8020 Calculus of gallbladder without cholecystitis without obstruction: Secondary | ICD-10-CM | POA: Insufficient documentation

## 2021-04-23 DIAGNOSIS — I10 Essential (primary) hypertension: Secondary | ICD-10-CM | POA: Insufficient documentation

## 2021-04-23 LAB — CBC WITH DIFFERENTIAL/PLATELET
Abs Immature Granulocytes: 0.01 10*3/uL (ref 0.00–0.07)
Basophils Absolute: 0.1 10*3/uL (ref 0.0–0.1)
Basophils Relative: 1 %
Eosinophils Absolute: 0.2 10*3/uL (ref 0.0–0.5)
Eosinophils Relative: 3 %
HCT: 41.3 % (ref 36.0–46.0)
Hemoglobin: 13.5 g/dL (ref 12.0–15.0)
Immature Granulocytes: 0 %
Lymphocytes Relative: 31 %
Lymphs Abs: 2.2 10*3/uL (ref 0.7–4.0)
MCH: 29 pg (ref 26.0–34.0)
MCHC: 32.7 g/dL (ref 30.0–36.0)
MCV: 88.8 fL (ref 80.0–100.0)
Monocytes Absolute: 0.5 10*3/uL (ref 0.1–1.0)
Monocytes Relative: 8 %
Neutro Abs: 4.1 10*3/uL (ref 1.7–7.7)
Neutrophils Relative %: 57 %
Platelets: 279 10*3/uL (ref 150–400)
RBC: 4.65 MIL/uL (ref 3.87–5.11)
RDW: 14.1 % (ref 11.5–15.5)
WBC: 7.1 10*3/uL (ref 4.0–10.5)
nRBC: 0 % (ref 0.0–0.2)

## 2021-04-23 LAB — COMPREHENSIVE METABOLIC PANEL
ALT: 38 U/L (ref 0–44)
AST: 26 U/L (ref 15–41)
Albumin: 4.1 g/dL (ref 3.5–5.0)
Alkaline Phosphatase: 59 U/L (ref 38–126)
Anion gap: 7 (ref 5–15)
BUN: 21 mg/dL (ref 8–23)
CO2: 27 mmol/L (ref 22–32)
Calcium: 9.2 mg/dL (ref 8.9–10.3)
Chloride: 101 mmol/L (ref 98–111)
Creatinine, Ser: 0.61 mg/dL (ref 0.44–1.00)
GFR, Estimated: >60 mL/min (ref >60)
Glucose, Bld: 113 mg/dL — ABNORMAL HIGH (ref 70–99)
Potassium: 3.5 mmol/L (ref 3.5–5.1)
Sodium: 135 mmol/L (ref 135–145)
Total Bilirubin: 0.5 mg/dL (ref 0.3–1.2)
Total Protein: 7.7 g/dL (ref 6.5–8.1)

## 2021-04-24 DIAGNOSIS — Z0000 Encounter for general adult medical examination without abnormal findings: Secondary | ICD-10-CM | POA: Diagnosis not present

## 2021-04-24 DIAGNOSIS — Z0001 Encounter for general adult medical examination with abnormal findings: Secondary | ICD-10-CM | POA: Diagnosis not present

## 2021-04-24 DIAGNOSIS — K219 Gastro-esophageal reflux disease without esophagitis: Secondary | ICD-10-CM | POA: Diagnosis not present

## 2021-04-24 DIAGNOSIS — I10 Essential (primary) hypertension: Secondary | ICD-10-CM | POA: Diagnosis not present

## 2021-04-24 DIAGNOSIS — E559 Vitamin D deficiency, unspecified: Secondary | ICD-10-CM | POA: Diagnosis not present

## 2021-04-24 DIAGNOSIS — E041 Nontoxic single thyroid nodule: Secondary | ICD-10-CM | POA: Diagnosis not present

## 2021-04-24 DIAGNOSIS — I700 Atherosclerosis of aorta: Secondary | ICD-10-CM | POA: Diagnosis not present

## 2021-04-24 DIAGNOSIS — E785 Hyperlipidemia, unspecified: Secondary | ICD-10-CM | POA: Diagnosis not present

## 2021-04-24 DIAGNOSIS — R7303 Prediabetes: Secondary | ICD-10-CM | POA: Diagnosis not present

## 2021-04-24 DIAGNOSIS — M810 Age-related osteoporosis without current pathological fracture: Secondary | ICD-10-CM | POA: Diagnosis not present

## 2021-04-28 ENCOUNTER — Encounter
Admission: RE | Disposition: A | Discharge status: Home / Self Care | Payer: Self-pay | Source: Home / Self Care | Attending: Surgery

## 2021-04-28 ENCOUNTER — Other Ambulatory Visit: Payer: Self-pay

## 2021-04-28 ENCOUNTER — Ambulatory Visit: Payer: Medicare Other | Admitting: Anesthesiology

## 2021-04-28 ENCOUNTER — Ambulatory Visit
Admission: RE | Admit: 2021-04-28 | Discharge: 2021-04-28 | Disposition: A | Discharge status: Home / Self Care | Payer: Medicare Other | Attending: Surgery | Admitting: Surgery

## 2021-04-28 ENCOUNTER — Encounter: Payer: Self-pay | Admitting: Surgery

## 2021-04-28 DIAGNOSIS — E669 Obesity, unspecified: Secondary | ICD-10-CM | POA: Insufficient documentation

## 2021-04-28 DIAGNOSIS — I509 Heart failure, unspecified: Secondary | ICD-10-CM | POA: Insufficient documentation

## 2021-04-28 DIAGNOSIS — Z87891 Personal history of nicotine dependence: Secondary | ICD-10-CM | POA: Insufficient documentation

## 2021-04-28 DIAGNOSIS — Z79899 Other long term (current) drug therapy: Secondary | ICD-10-CM | POA: Diagnosis not present

## 2021-04-28 DIAGNOSIS — I110 Hypertensive heart disease with heart failure: Secondary | ICD-10-CM | POA: Insufficient documentation

## 2021-04-28 DIAGNOSIS — K811 Chronic cholecystitis: Secondary | ICD-10-CM | POA: Diagnosis not present

## 2021-04-28 DIAGNOSIS — K8010 Calculus of gallbladder with chronic cholecystitis without obstruction: Secondary | ICD-10-CM | POA: Diagnosis not present

## 2021-04-28 DIAGNOSIS — Z6838 Body mass index (BMI) 38.0-38.9, adult: Secondary | ICD-10-CM | POA: Insufficient documentation

## 2021-04-28 DIAGNOSIS — J449 Chronic obstructive pulmonary disease, unspecified: Secondary | ICD-10-CM | POA: Insufficient documentation

## 2021-04-28 DIAGNOSIS — K219 Gastro-esophageal reflux disease without esophagitis: Secondary | ICD-10-CM | POA: Insufficient documentation

## 2021-04-28 DIAGNOSIS — K8020 Calculus of gallbladder without cholecystitis without obstruction: Secondary | ICD-10-CM

## 2021-04-28 SURGERY — CHOLECYSTECTOMY, ROBOT-ASSISTED, LAPAROSCOPIC
Anesthesia: General

## 2021-04-28 MED ORDER — INDOCYANINE GREEN 25 MG IV SOLR
2.5000 mg | Freq: Once | INTRAVENOUS | Status: AC
  Administered 2021-04-28: 2.5 mg via INTRAVENOUS
  Filled 2021-04-28: qty 1

## 2021-04-28 MED ORDER — CHLORHEXIDINE GLUCONATE CLOTH 2 % EX PADS: 6.0000 | MEDICATED_PAD | Freq: Once | CUTANEOUS | Status: DC

## 2021-04-28 MED ORDER — KETOROLAC TROMETHAMINE 30 MG/ML IJ SOLN
INTRAMUSCULAR | Status: DC | PRN
Start: 2021-04-28 — End: 2021-04-28
  Administered 2021-04-28: 15 mg via INTRAVENOUS

## 2021-04-28 MED ORDER — BUPIVACAINE-EPINEPHRINE (PF) 0.25% -1:200000 IJ SOLN
INTRAMUSCULAR | Status: DC | PRN
  Administered 2021-04-28: 26 mL

## 2021-04-28 MED ORDER — ONDANSETRON HCL 4 MG/2ML IJ SOLN: 4.0000 mg | Freq: Once | INTRAMUSCULAR | Status: DC | PRN

## 2021-04-28 MED ORDER — PROPOFOL 10 MG/ML IV BOLUS
INTRAVENOUS | Status: DC | PRN
Start: 2021-04-28 — End: 2021-04-28
  Administered 2021-04-28: 150 mg via INTRAVENOUS

## 2021-04-28 MED ORDER — LIDOCAINE HCL (CARDIAC) PF 100 MG/5ML IV SOSY
PREFILLED_SYRINGE | INTRAVENOUS | Status: DC | PRN
  Administered 2021-04-28: 80 mg via INTRAVENOUS

## 2021-04-28 MED ORDER — CIPROFLOXACIN IN D5W 400 MG/200ML IV SOLN
INTRAVENOUS | Status: AC
  Filled 2021-04-28: qty 200

## 2021-04-28 MED ORDER — FENTANYL CITRATE (PF) 100 MCG/2ML IJ SOLN
25.0000 ug | INTRAMUSCULAR | Status: DC | PRN
  Administered 2021-04-28 (×2): 50 ug via INTRAVENOUS

## 2021-04-28 MED ORDER — BUPIVACAINE LIPOSOME 1.3 % IJ SUSP: 20.0000 mL | Freq: Once | INTRAMUSCULAR | Status: DC

## 2021-04-28 MED ORDER — BUPIVACAINE-EPINEPHRINE (PF) 0.25% -1:200000 IJ SOLN
INTRAMUSCULAR | Status: AC
  Filled 2021-04-28: qty 30

## 2021-04-28 MED ORDER — GABAPENTIN 300 MG PO CAPS
ORAL_CAPSULE | ORAL | Status: AC
  Administered 2021-04-28: 300 mg via ORAL
  Filled 2021-04-28: qty 1

## 2021-04-28 MED ORDER — CHLORHEXIDINE GLUCONATE 0.12 % MT SOLN: 15.0000 mL | Freq: Once | OROMUCOSAL | Status: AC

## 2021-04-28 MED ORDER — ONDANSETRON HCL 4 MG/2ML IJ SOLN
INTRAMUSCULAR | Status: DC | PRN
Start: 2021-04-28 — End: 2021-04-28
  Administered 2021-04-28: 4 mg via INTRAVENOUS

## 2021-04-28 MED ORDER — IBUPROFEN 800 MG PO TABS: 800.0000 mg | ORAL_TABLET | Freq: Three times a day (TID) | ORAL | 0 refills | Status: AC | PRN

## 2021-04-28 MED ORDER — ORAL CARE MOUTH RINSE: 15.0000 mL | Freq: Once | OROMUCOSAL | Status: AC

## 2021-04-28 MED ORDER — CHLORHEXIDINE GLUCONATE 0.12 % MT SOLN
OROMUCOSAL | Status: AC
  Administered 2021-04-28: 15 mL via OROMUCOSAL
  Filled 2021-04-28: qty 15

## 2021-04-28 MED ORDER — HYDROCODONE-ACETAMINOPHEN 5-325 MG PO TABS: 1.0000 | ORAL_TABLET | Freq: Four times a day (QID) | ORAL | 0 refills | Status: DC | PRN

## 2021-04-28 MED ORDER — ACETAMINOPHEN 500 MG PO TABS
ORAL_TABLET | ORAL | Status: AC
  Administered 2021-04-28: 1000 mg via ORAL
  Filled 2021-04-28: qty 2

## 2021-04-28 MED ORDER — CELECOXIB 200 MG PO CAPS: 200.0000 mg | ORAL_CAPSULE | ORAL | Status: AC

## 2021-04-28 MED ORDER — FENTANYL CITRATE (PF) 100 MCG/2ML IJ SOLN
INTRAMUSCULAR | Status: AC
  Filled 2021-04-28: qty 2

## 2021-04-28 MED ORDER — LACTATED RINGERS IV SOLN: INTRAVENOUS | Status: DC

## 2021-04-28 MED ORDER — CELECOXIB 200 MG PO CAPS
ORAL_CAPSULE | ORAL | Status: AC
  Administered 2021-04-28: 200 mg via ORAL
  Filled 2021-04-28: qty 1

## 2021-04-28 MED ORDER — GABAPENTIN 300 MG PO CAPS: 300.0000 mg | ORAL_CAPSULE | ORAL | Status: AC

## 2021-04-28 MED ORDER — ROCURONIUM BROMIDE 10 MG/ML (PF) SYRINGE
PREFILLED_SYRINGE | INTRAVENOUS | Status: AC
  Filled 2021-04-28: qty 20

## 2021-04-28 MED ORDER — CIPROFLOXACIN IN D5W 400 MG/200ML IV SOLN: 400.0000 mg | INTRAVENOUS | Status: DC

## 2021-04-28 MED ORDER — ROCURONIUM BROMIDE 100 MG/10ML IV SOLN
INTRAVENOUS | Status: DC | PRN
  Administered 2021-04-28: 50 mg via INTRAVENOUS

## 2021-04-28 MED ORDER — FENTANYL CITRATE (PF) 100 MCG/2ML IJ SOLN
INTRAMUSCULAR | Status: DC | PRN
  Administered 2021-04-28: 25 ug via INTRAVENOUS
  Administered 2021-04-28: 50 ug via INTRAVENOUS

## 2021-04-28 MED ORDER — ACETAMINOPHEN 500 MG PO TABS: 1000.0000 mg | ORAL_TABLET | ORAL | Status: AC

## 2021-04-28 MED ORDER — OXYCODONE HCL 5 MG PO TABS
5.0000 mg | ORAL_TABLET | Freq: Once | ORAL | Status: AC | PRN
  Administered 2021-04-28: 5 mg via ORAL

## 2021-04-28 MED ORDER — OXYCODONE HCL 5 MG/5ML PO SOLN: 5.0000 mg | Freq: Once | ORAL | Status: AC | PRN

## 2021-04-28 MED ORDER — CEFAZOLIN SODIUM-DEXTROSE 2-4 GM/100ML-% IV SOLN
INTRAVENOUS | Status: AC
  Filled 2021-04-28: qty 100

## 2021-04-28 MED ORDER — CEFAZOLIN SODIUM-DEXTROSE 2-4 GM/100ML-% IV SOLN
2.0000 g | Freq: Once | INTRAVENOUS | Status: AC
  Administered 2021-04-28: 2 g via INTRAVENOUS

## 2021-04-28 MED ORDER — OXYCODONE HCL 5 MG PO TABS
ORAL_TABLET | ORAL | Status: AC
  Filled 2021-04-28: qty 1

## 2021-04-28 MED ORDER — DEXAMETHASONE SODIUM PHOSPHATE 10 MG/ML IJ SOLN
INTRAMUSCULAR | Status: DC | PRN
  Administered 2021-04-28: 5 mg via INTRAVENOUS

## 2021-04-28 MED ORDER — SUGAMMADEX SODIUM 200 MG/2ML IV SOLN
INTRAVENOUS | Status: DC | PRN
  Administered 2021-04-28: 200 mg via INTRAVENOUS

## 2021-04-28 SURGICAL SUPPLY — 47 items
"PENCIL ELECTRO HAND CTR " (MISCELLANEOUS) ×1 IMPLANT
ADH SKN CLS APL DERMABOND .7 (GAUZE/BANDAGES/DRESSINGS) ×1
BAG SPEC RTRVL LRG 6X4 10 (ENDOMECHANICALS) ×1
CANNULA CAP OBTURATR AIRSEAL 8 (CAP) ×4 IMPLANT
CLIP LIGATING HEM O LOK PURPLE (MISCELLANEOUS) ×4 IMPLANT
COVER TIP SHEARS 8 DVNC (MISCELLANEOUS) ×2 IMPLANT
COVER TIP SHEARS 8MM DA VINCI (MISCELLANEOUS) ×3
DECANTER SPIKE VIAL GLASS SM (MISCELLANEOUS) ×4 IMPLANT
DERMABOND ADVANCED (GAUZE/BANDAGES/DRESSINGS) ×2
DERMABOND ADVANCED .7 DNX12 (GAUZE/BANDAGES/DRESSINGS) ×2 IMPLANT
DRAPE ARM DVNC X/XI (DISPOSABLE) ×8 IMPLANT
DRAPE COLUMN DVNC XI (DISPOSABLE) ×2 IMPLANT
DRAPE DA VINCI XI ARM (DISPOSABLE) ×12
DRAPE DA VINCI XI COLUMN (DISPOSABLE) ×3
ELECT CAUTERY BLADE 6.4 (BLADE) ×4 IMPLANT
GLOVE SURG ORTHO LTX SZ7.5 (GLOVE) ×12 IMPLANT
GOWN STRL REUS W/ TWL LRG LVL3 (GOWN DISPOSABLE) ×8 IMPLANT
GOWN STRL REUS W/TWL LRG LVL3 (GOWN DISPOSABLE) ×9
GRASPER SUT TROCAR 14GX15 (MISCELLANEOUS) IMPLANT
INFUSOR MANOMETER BAG 3000ML (MISCELLANEOUS) IMPLANT
IRRIGATION STRYKERFLOW (MISCELLANEOUS) IMPLANT
IRRIGATOR STRYKERFLOW (MISCELLANEOUS)
IRRIGATOR SUCT 8 DISP DVNC XI (IRRIGATION / IRRIGATOR) IMPLANT
IRRIGATOR SUCTION 8MM XI DISP (IRRIGATION / IRRIGATOR)
IV NS IRRIG 3000ML ARTHROMATIC (IV SOLUTION) IMPLANT
KIT PINK PAD W/HEAD ARE REST (MISCELLANEOUS) ×3
KIT PINK PAD W/HEAD ARM REST (MISCELLANEOUS) ×2 IMPLANT
KIT TURNOVER KIT A (KITS) ×4 IMPLANT
LABEL OR SOLS (LABEL) ×4 IMPLANT
MANIFOLD NEPTUNE II (INSTRUMENTS) ×4 IMPLANT
NDL INSUFFLATION 14GA 120MM (NEEDLE) IMPLANT
NEEDLE HYPO 22GX1.5 SAFETY (NEEDLE) ×4 IMPLANT
NEEDLE INSUFFLATION 14GA 120MM (NEEDLE) IMPLANT
NS IRRIG 500ML POUR BTL (IV SOLUTION) ×4 IMPLANT
PACK LAP CHOLECYSTECTOMY (MISCELLANEOUS) ×4 IMPLANT
PENCIL ELECTRO HAND CTR (MISCELLANEOUS) ×2 IMPLANT
POUCH SPECIMEN RETRIEVAL 10MM (ENDOMECHANICALS) ×4 IMPLANT
SEAL CANN UNIV 5-8 DVNC XI (MISCELLANEOUS) ×6 IMPLANT
SEAL XI 5MM-8MM UNIVERSAL (MISCELLANEOUS) ×9
SET TUBE FILTERED XL AIRSEAL (SET/KITS/TRAYS/PACK) ×4 IMPLANT
SOLUTION ELECTROLUBE (MISCELLANEOUS) ×4 IMPLANT
SUT MNCRL 4-0 (SUTURE) ×3
SUT MNCRL 4-0 27XMFL (SUTURE) ×1
SUT VICRYL 0 AB UR-6 (SUTURE) ×4 IMPLANT
SUTURE MNCRL 4-0 27XMF (SUTURE) ×2 IMPLANT
TROCAR Z-THREAD FIOS 11X100 BL (TROCAR) IMPLANT
WATER STERILE IRR 500ML POUR (IV SOLUTION) ×2 IMPLANT

## 2021-04-28 NOTE — Anesthesia Preprocedure Evaluation (Addendum)
Anesthesia Evaluation  Patient identified by MRN, date of birth, ID band Patient awake    Reviewed: Allergy & Precautions, NPO status , Patient's Chart, lab work & pertinent test results  History of Anesthesia Complications Negative for: history of anesthetic complications  Airway Mallampati: II  TM Distance: >3 FB Neck ROM: Full    Dental no notable dental hx. (+) Teeth Intact   Pulmonary shortness of breath and with exertion, neg sleep apnea, COPD,  COPD inhaler, Patient abstained from smoking.Not current smoker, former smoker,  Just started daily Breo two days ago. Never hospitalized for COPD   Pulmonary exam normal breath sounds clear to auscultation       Cardiovascular Exercise Tolerance: Poor METS: 3 - Mets hypertension, +CHF  (-) CAD and (-) Past MI (-) dysrhythmias  Rhythm:Regular Rate:Normal - Systolic murmurs TTE 8938: 1. Left ventricular ejection fraction, by visual estimation, is 60 to  65%. The left ventricle has normal function. There is no left ventricular  hypertrophy.  2. Left ventricular diastolic parameters are consistent with Grade II  diastolic dysfunction (pseudonormalization).  3. The left ventricle has no regional wall motion abnormalities.  4. Global right ventricle has normal systolic function.The right  ventricular size is normal. No increase in right ventricular wall  thickness.  5. Left atrial size was normal.  6. TR signal is inadequate for assessing pulmonary artery systolic  pressure.    Neuro/Psych negative neurological ROS  negative psych ROS   GI/Hepatic GERD  Medicated,(+)     (-) substance abuse  ,   Endo/Other  neg diabetes  Renal/GU negative Renal ROS     Musculoskeletal  (+) Arthritis ,   Abdominal (+) + obese,   Peds  Hematology   Anesthesia Other Findings Past Medical History: No date: Cancer of left kidney ([facility]) No date: Closed left arm fracture      Comment:  [facility]  No date: COPD (chronic obstructive pulmonary disease) ([facility]) No date: Dyslipidemia No date: GERD (gastroesophageal reflux disease) 2008: Hypertension No date: Obesity No date: Osteoarthritis No date: Osteoporosis 2008: Pneumonia No date: Pre-diabetes No date: Thyroid nodule No date: Vitamin D deficiency  Reproductive/Obstetrics                            Anesthesia Physical Anesthesia Plan  ASA: 3  Anesthesia Plan: General   Post-op Pain Management: Celebrex PO (pre-op), Gabapentin PO (pre-op) and Tylenol PO (pre-op)   Induction: Intravenous  PONV Risk Score and Plan: 4 or greater and Ondansetron, Dexamethasone and Treatment may vary due to age or medical condition  Airway Management Planned: Oral ETT  Additional Equipment: None  Intra-op Plan:   Post-operative Plan: Extubation in OR  Informed Consent: I have reviewed the patients History and Physical, chart, labs and discussed the procedure including the risks, benefits and alternatives for the proposed anesthesia with the patient or authorized representative who has indicated his/her understanding and acceptance.     Dental advisory given  Plan Discussed with: CRNA and Surgeon  Anesthesia Plan Comments: (Discussed risks of anesthesia with patient, including PONV, sore throat, lip/dental/eye damage. Rare risks discussed as well, such as cardiorespiratory and neurological sequelae, and allergic reactions. Discussed the role of CRNA in patient's perioperative care. Patient understands.  Patient has listed allergy to PCN - patient reports "criss crossing hives across my body" with amoxicillin. Severe blistering skin reaction (SJS/TEN)? no Liver or kidney injury caused by PCN? no Hemolytic anemia  from PCN? no Drug fever? no Painful swollen joints? no Severe reaction involving inside of mouth, eye, or genital ulcers? no Based on current evidence Alfonse Alpers et al, J  Allergy Clin Immunol Pract, 2019), will proceed with cefazolin use: Yes  )        Anesthesia Quick Evaluation

## 2021-04-28 NOTE — Anesthesia Procedure Notes (Signed)
Procedure Name: Intubation Date/Time: 04/28/2021 1:58 PM Performed by: Lowry Bowl, CRNA Pre-anesthesia Checklist: Patient identified, Emergency Drugs available, Suction available and Patient being monitored Patient Re-evaluated:Patient Re-evaluated prior to induction Oxygen Delivery Method: Circle system utilized Preoxygenation: Pre-oxygenation with 100% oxygen Induction Type: IV induction Ventilation: Mask ventilation without difficulty Laryngoscope Size: 3 and McGraph Grade View: Grade I Tube type: Oral Tube size: 6.5 mm Number of attempts: 1 Airway Equipment and Method: Stylet and Video-laryngoscopy Placement Confirmation: ETT inserted through vocal cords under direct vision, positive ETCO2 and breath sounds checked- equal and bilateral Secured at: 20 cm Tube secured with: Tape Dental Injury: Teeth and Oropharynx as per pre-operative assessment

## 2021-04-28 NOTE — Interval H&P Note (Signed)
History and Physical Interval Note:  04/28/2021 1:23 PM  Amber Perez  has presented today for surgery, with the diagnosis of chronic calculous cholecystitis.  The various methods of treatment have been discussed with the patient and family. After consideration of risks, benefits and other options for treatment, the patient has consented to  Procedure(s): XI ROBOTIC ASSISTED LAPAROSCOPIC CHOLECYSTECTOMY (N/A) [name] (ICG) (N/A) as a surgical intervention.  The patient's history has been reviewed, patient examined, no change in status, stable for surgery.  I have reviewed the patient's chart and labs.  Questions were answered to the patient's satisfaction.     Ronny Bacon

## 2021-04-28 NOTE — Discharge Instructions (Signed)
AMBULATORY SURGERY  ?DISCHARGE INSTRUCTIONS ? ? ?The drugs that you were given will stay in your system until tomorrow so for the next 24 hours you should not: ? ?Drive an automobile ?Make any legal decisions ?Drink any alcoholic beverage ? ? ?You may resume regular meals tomorrow.  Today it is better to start with liquids and gradually work up to solid foods. ? ?You may eat anything you prefer, but it is better to start with liquids, then soup and crackers, and gradually work up to solid foods. ? ? ?Please notify your doctor immediately if you have any unusual bleeding, trouble breathing, redness and pain at the surgery site, drainage, fever, or pain not relieved by medication. ? ? ? ?Additional Instructions: ? ? ? ?Please contact your physician with any problems or Same Day Surgery at [phone], Monday through Friday 6 am to 4 pm, or [place] [facility] [place] Main number at [phone].  ?

## 2021-04-28 NOTE — Op Note (Signed)
Robotic cholecystectomy with Indocyamine Green Ductal Imaging.   Pre-operative Diagnosis: Chronic calculus cholecystitis  Post-operative Diagnosis:  Same.  Procedure: Robotic assisted laparoscopic cholecystectomy with Indocyamine Green Ductal Imaging.   Surgeon: Ronny Bacon, M.D., FACS  Anesthesia: General. with endotracheal tube  Findings: as expected filmy adhesions, large stone.  Inferior midline anterior abdominal wall adhesions.   Estimated Blood Loss: 5 mL         Drains: None         Specimens: Gallbladder           Complications: none  Procedure Details  The patient was seen again in the Holding Room.  2.5 mg dose of ICG was administered intravenously.   The benefits, complications, treatment options, risks and expected outcomes were again reviewed with the patient. The likelihood of improving the patient's symptoms with return to their baseline status is good.  The patient and/or family concurred with the proposed plan, giving informed consent, again alternatives reviewed.  The patient was taken to Operating Room, identified, and the procedure verified as robotic assisted laparoscopic cholecystectomy.  Prior to the induction of general anesthesia, antibiotic prophylaxis was administered. VTE prophylaxis was in place. General endotracheal anesthesia was then administered and tolerated well. The patient was positioned in the supine position.  After the induction, the abdomen was prepped with Chloraprep and draped in the sterile fashion.  A Time Out was held and the above information confirmed.  After local infiltration of quarter percent Marcaine with epinephrine, stab incision was made left upper quadrant.  Just below the costal margin at Palmer's point, approximately midclavicular line the Veres needle is passed with sensation of the layers to penetrate the abdominal wall and into the peritoneum.  Saline drop test is confirmed peritoneal placement.  Insufflation is initiated  with carbon dioxide to pressures of 15 mmHg.  Right infra-umbilical local infiltration with quarter percent Marcaine with epinephrine is utilized.  Made a 12 mm incision on the right periumbilical site, I advanced an optical 72mm port under direct visualization into the peritoneal cavity.  Once the peritoneum was penetrated, insufflation was initiated.  The trocar was then advanced into the abdominal cavity under direct visualization. Pneumoperitoneum was then continued with Air seal utilizing CO2 at 15 mmHg or less and tolerated well without any adverse changes in the patient's vital signs.  Two 8.5-mm ports were placed in the left lower quadrant and laterally, and one to the right lower quadrant, all under direct vision. All skin incisions  were infiltrated with a local anesthetic agent before making the incision and placing the trocars.  The patient was positioned  in reverse Trendelenburg, tilted the patient's left side down.  Da Vinci XI robot was then positioned on to the patient's left side, and docked.  The gallbladder was identified, the fundus grasped via the arm 4 Prograsp and retracted cephalad. Adhesions were lysed with scissors and cautery.  The infundibulum was identified grasped and retracted laterally, exposing the peritoneum overlying the triangle of Calot. This was then opened and dissected using cautery & scissors. An extended critical view of the cystic duct and cystic artery was obtained, aided by the ICG via FireFly which enabled ready visualization of the ductal anatomy.    The cystic duct was clearly identified and dissected to isolation.   Artery well isolated and clipped, and the cystic duct was triple clipped and divided with scissors, as close to the gallbladder neck as feasible, thus leaving two on the remaining stump.  The specimen  side of the artery is sealed with bipolar and divided with monopolar scissors.   The gallbladder was taken from the gallbladder fossa in a  retrograde fashion with the electrocautery. The gallbladder was removed and placed in an Endocatch bag.  The liver bed is inspected. Hemostasis was confirmed.  The robot was undocked and moved away from the operative field. No irrigation was utilized. The gallbladder and Endocatch sac were then removed through the infraumbilical port site.   Inspection of the right upper quadrant was performed. No bleeding, bile duct injury or leak, or bowel injury was noted. The infra-umbilical port site fascia was closed with interrumpted 0 Vicryl sutures using PMI/cone under direct visualization. Pneumoperitoneum was released and ports removed.  4-0 subcuticular Monocryl was used to close the skin. Dermabond was  applied.  The patient was then extubated and brought to the recovery room in stable condition. Sponge, lap, and needle counts were correct at closure and at the conclusion of the case.               Ronny Bacon, M.D., [facility] 04/28/2021 2:59 PM

## 2021-04-28 NOTE — Transfer of Care (Signed)
Immediate Anesthesia Transfer of Care Note  Patient: Amber Perez  Procedure(s) Performed: XI ROBOTIC ASSISTED LAPAROSCOPIC CHOLECYSTECTOMY INDOCYANINE GREEN FLUORESCENCE IMAGING (ICG)  Patient Location: PACU  Anesthesia Type:General  Level of Consciousness: sedated  Airway & Oxygen Therapy: Patient Spontanous Breathing  Post-op Assessment: Report given to RN  Post vital signs: Reviewed and stable  Last Vitals:  Vitals Value Taken Time  BP 159/83 04/28/21 1505  Temp 36.5 C 04/28/21 1505  Pulse 84 04/28/21 1511  Resp 14 04/28/21 1513  SpO2 96 % 04/28/21 1511  Vitals shown include unvalidated device data.  Last Pain:  Vitals:   04/28/21 1505  TempSrc:   PainSc: 0-No pain         Complications: No notable events documented.

## 2021-04-29 NOTE — Anesthesia Postprocedure Evaluation (Signed)
Anesthesia Post Note  Patient: Amber Perez  Procedure(s) Performed: XI ROBOTIC ASSISTED LAPAROSCOPIC CHOLECYSTECTOMY INDOCYANINE GREEN FLUORESCENCE IMAGING (ICG)  Patient location during evaluation: PACU Anesthesia Type: General Level of consciousness: combative Pain management: pain level controlled Vital Signs Assessment: post-procedure vital signs reviewed and stable Respiratory status: spontaneous breathing, nonlabored ventilation and respiratory function stable Cardiovascular status: blood pressure returned to baseline and stable Postop Assessment: no apparent nausea or vomiting Anesthetic complications: no   No notable events documented.   Last Vitals:  Vitals:   04/28/21 1546 04/28/21 1556  BP: (!) 154/83 (!) 153/80  Pulse: 90 89  Resp: 15 18  Temp:  (!) 36.1 C  SpO2: 99% 98%    Last Pain:  Vitals:   04/28/21 1556  TempSrc: Temporal  PainSc: [place]

## 2021-04-30 LAB — SURGICAL PATHOLOGY

## 2021-05-09 DIAGNOSIS — J431 Panlobular emphysema: Secondary | ICD-10-CM | POA: Diagnosis not present

## 2021-05-13 ENCOUNTER — Encounter: Payer: Self-pay | Admitting: Surgery

## 2021-05-13 ENCOUNTER — Other Ambulatory Visit: Payer: Self-pay

## 2021-05-13 ENCOUNTER — Ambulatory Visit (INDEPENDENT_AMBULATORY_CARE_PROVIDER_SITE_OTHER): Payer: Medicare Other | Admitting: Surgery

## 2021-05-13 VITALS — BP 136/83 | HR 101 | Temp 97.8°F | Ht 59.5 in | Wt 193.6 lb

## 2021-05-13 DIAGNOSIS — Z9049 Acquired absence of other specified parts of digestive tract: Secondary | ICD-10-CM | POA: Insufficient documentation

## 2021-05-13 DIAGNOSIS — K8010 Calculus of gallbladder with chronic cholecystitis without obstruction: Secondary | ICD-10-CM

## 2021-05-13 DIAGNOSIS — Z09 Encounter for follow-up examination after completed treatment for conditions other than malignant neoplasm: Secondary | ICD-10-CM

## 2021-05-13 NOTE — Patient Instructions (Addendum)
If you have concerns or questions, please feel free to call our office. Follow up as needed.    GENERAL POST-OPERATIVE PATIENT INSTRUCTIONS   WOUND CARE INSTRUCTIONS:  Keep a dry clean dressing on the wound if there is drainage. The initial bandage may be removed after 24 hours.  Once the wound has quit draining you may leave it open to air.  If clothing rubs against the wound or causes irritation and the wound is not draining you may cover it with a dry dressing during the daytime.  Try to keep the wound dry and avoid ointments on the wound unless directed to do so.  If the wound becomes bright red and painful or starts to drain infected material that is not clear, please contact your physician immediately.  If the wound is mildly pink and has a thick firm ridge underneath it, this is normal, and is referred to as a healing ridge.  This will resolve over the next 4-6 weeks.  BATHING: You may shower if you have been informed of this by your surgeon. However, Please do not submerge in a tub, hot tub, or pool until incisions are completely sealed or have been told by your surgeon that you may do so.  DIET:  You may eat any foods that you can tolerate.  It is a good idea to eat a high fiber diet and take in plenty of fluids to prevent constipation.  If you do become constipated you may want to take a mild laxative or take ducolax tablets on a daily basis until your bowel habits are regular.  Constipation can be very uncomfortable, along with straining, after recent surgery.  ACTIVITY:  You are encouraged to cough and deep breath or use your incentive spirometer if you were given one, every 15-30 minutes when awake.  This will help prevent respiratory complications and low grade fevers post-operatively if you had a general anesthetic.  You may want to hug a pillow when coughing and sneezing to add additional support to the surgical area, if you had abdominal or chest surgery, which will decrease pain during  these times.  You are encouraged to walk and engage in light activity for the next two weeks.  You should not lift, push or pull more than 15-20 pounds, until 05/26/2021 as it could put you at increased risk for complications.  Twenty pounds is roughly equivalent to a plastic bag of groceries. At that time- Listen to your body when lifting, if you have pain when lifting, stop and then try again in a few days. Soreness after doing exercises or activities of daily living is normal as you get back in to your normal routine.  MEDICATIONS:  Try to take narcotic medications and anti-inflammatory medications, such as tylenol, ibuprofen, naprosyn, etc., with food.  This will minimize stomach upset from the medication.  Should you develop nausea and vomiting from the pain medication, or develop a rash, please discontinue the medication and contact your physician.  You should not drive, make important decisions, or operate machinery when taking narcotic pain medication.  SUNBLOCK Use sun block to incision area over the next year if this area will be exposed to sun. This helps decrease scarring and will allow you avoid a permanent darkened area over your incision.  QUESTIONS:  Please feel free to call our office if you have any questions, and we will be glad to assist you.    Gallbladder Eating Plan  If you have a gallbladder condition,  you may have trouble digesting fats. Eating a low-fat diet can help reduce your symptoms, and may be helpful before and after having surgery to remove your gallbladder (cholecystectomy). Your health care provider may recommend that you work with a diet and nutrition specialist (dietitian) to help you reduce the amount of fat in your diet. What are tips for following this plan? General guidelines Limit your fat intake to less than 30% of your total daily calories. If you eat around 1,800 calories each day, this is less than 60 grams (g) of fat per day. Fat is an important part of  a healthy diet. Eating a low-fat diet can make it hard to maintain a healthy body weight. Ask your dietitian how much fat, calories, and other nutrients you need each day. Eat small, frequent meals throughout the day instead of three large meals. Drink at least 8-10 cups of fluid a day. Drink enough fluid to keep your urine clear or pale yellow. Limit alcohol intake to no more than 1 drink a day for nonpregnant women and 2 drinks a day for men. One drink equals 12 oz of beer, 5 oz of wine, or 1 oz of hard liquor. Reading food labels  Check Nutrition Facts on food labels for the amount of fat per serving. Choose foods with less than 3 grams of fat per serving. Shopping Choose nonfat and low-fat healthy foods. Look for the words "nonfat," "low fat," or "fat free." Avoid buying processed or prepackaged foods. Cooking Cook using low-fat methods, such as baking, broiling, grilling, or boiling. Cook with small amounts of healthy fats, such as olive oil, grapeseed oil, canola oil, or sunflower oil. What foods are recommended? All fresh, frozen, or canned fruits and vegetables. Whole grains. Low-fat or non-fat (skim) milk and yogurt. Lean meat, skinless poultry, fish, eggs, and beans. Low-fat protein supplement powders or drinks. Spices and herbs. What foods are not recommended? High-fat foods. These include baked goods, fast food, fatty cuts of meat, ice cream, french toast, sweet rolls, pizza, cheese bread, foods covered with butter, creamy sauces, or cheese. Fried foods. These include french fries, tempura, battered fish, breaded chicken, fried breads, and sweets. Foods with strong odors. Foods that cause bloating and gas. Summary A low-fat diet can be helpful if you have a gallbladder condition, or before and after gallbladder surgery. Limit your fat intake to less than 30% of your total daily calories. This is about 60 g of fat if you eat 1,800 calories each day. Eat small, frequent meals  throughout the day instead of three large meals. This information is not intended to replace advice given to you by your health care provider. Make sure you discuss any questions you have with your health care provider. Document Revised: 11/03/2019 Document Reviewed: 11/09/2019 Elsevier Patient Education  2022 [employer].

## 2021-05-13 NOTE — Progress Notes (Signed)
[facility] POST-OP OFFICE VISIT  05/13/2021  HPI: Amber Perez is a 76 y.o. female 15 days s/p robotic cholecystectomy.  She denies pain.  She reports her bowel movements have been waxing and waning between diarrhea and constipation.  For this she takes MiraLAX.  She used to take fiber, but does not do it consistently.  She denies nausea, vomiting, fevers and chills.  Vital signs: There were no vitals taken for this visit.   Physical Exam: Constitutional: She appears well. Abdomen: Soft and benign. Skin: Incisions appear to be healing well, they are clean, dry and intact.  Assessment/Plan: This is a 76 y.o. female 15 days s/p robotic cholecystectomy for chronic calculus cholecystitis.  Patient Active Problem List   Diagnosis Date Noted   Calculus of gallbladder without cholecystitis without obstruction 03/14/2021   Urinary frequency 02/26/2020   Dysuria 02/26/2020   Class 2 severe obesity due to excess calories with serious comorbidity and body mass index (BMI) of 36.0 to 36.9 in adult ([place]) 11/29/2019   Chronic pain of left knee 11/29/2019   Abdominal discomfort 11/29/2019   Low vitamin B12 level 08/16/2019   RLS (restless legs syndrome) 08/16/2019   Vitamin D deficiency 08/16/2019   Sinusitis 06/06/2019   Hip pain 04/05/2019   Overactive bladder 04/05/2019   Cancer of kidney ([place]) 04/05/2019   Tingling 04/05/2019   Lightheadedness 04/05/2019   Breast pain 07/20/2018   Dyspnea on exertion 07/14/2018   Encounter for general adult medical examination with abnormal findings 08/04/2017   Rib pain 06/23/2017   Right knee pain 12/14/2016   S/P total knee arthroplasty 06/29/2016   Essential hypertension 01/25/2014   Obesity (BMI 30-39.9) 01/25/2014   Atrophic vaginitis 03/24/2012   Thyroid nodule 11/25/2011   Osteopenia 11/25/2011   Hyperlipidemia 11/25/2011   GERD (gastroesophageal reflux disease) 03/19/2011   Allergic rhinitis 03/19/2011    -She  appears to be doing well, we reviewed her activity level, her diet and the utilization of fiber consistently.  We remain readily available I will be glad to see her again as needed.   Ronny Bacon M.D., FACS 05/13/2021, 9:15 AM

## 2021-05-14 ENCOUNTER — Other Ambulatory Visit: Payer: Self-pay | Admitting: Pulmonary Disease

## 2021-05-14 DIAGNOSIS — K7460 Unspecified cirrhosis of liver: Secondary | ICD-10-CM

## 2021-05-14 DIAGNOSIS — K760 Fatty (change of) liver, not elsewhere classified: Secondary | ICD-10-CM

## 2021-05-15 ENCOUNTER — Other Ambulatory Visit: Payer: Self-pay

## 2021-05-15 ENCOUNTER — Ambulatory Visit
Admission: RE | Admit: 2021-05-15 | Discharge: 2021-05-15 | Disposition: A | Discharge status: Home / Self Care | Payer: Medicare Other | Source: Ambulatory Visit | Attending: Family Medicine | Admitting: Family Medicine

## 2021-05-15 DIAGNOSIS — Z1231 Encounter for screening mammogram for malignant neoplasm of breast: Secondary | ICD-10-CM

## 2021-05-20 ENCOUNTER — Other Ambulatory Visit: Payer: Self-pay

## 2021-05-20 ENCOUNTER — Ambulatory Visit
Admission: RE | Admit: 2021-05-20 | Discharge: 2021-05-20 | Disposition: A | Discharge status: Home / Self Care | Payer: Medicare Other | Source: Ambulatory Visit | Attending: Pulmonary Disease | Admitting: Pulmonary Disease

## 2021-05-20 DIAGNOSIS — K7460 Unspecified cirrhosis of liver: Secondary | ICD-10-CM | POA: Diagnosis present

## 2021-05-20 DIAGNOSIS — K760 Fatty (change of) liver, not elsewhere classified: Secondary | ICD-10-CM | POA: Insufficient documentation

## 2021-07-21 ENCOUNTER — Other Ambulatory Visit: Payer: Self-pay | Admitting: Family Medicine

## 2021-07-21 DIAGNOSIS — I10 Essential (primary) hypertension: Secondary | ICD-10-CM

## 2021-09-12 ENCOUNTER — Ambulatory Visit: Payer: Medicare Other | Admitting: Family Medicine

## 2021-11-14 ENCOUNTER — Telehealth: Payer: Self-pay | Admitting: Family Medicine

## 2021-11-14 NOTE — Telephone Encounter (Signed)
Spoke to patient to sched AWV she stated she changed practices to [facility] and would not disclose any information

## 2022-02-14 ENCOUNTER — Ambulatory Visit (INDEPENDENT_AMBULATORY_CARE_PROVIDER_SITE_OTHER): Payer: Medicare Other

## 2022-02-14 ENCOUNTER — Encounter: Payer: Self-pay | Admitting: Emergency Medicine

## 2022-02-14 ENCOUNTER — Ambulatory Visit
Admission: EM | Admit: 2022-02-14 | Discharge: 2022-02-14 | Disposition: A | Discharge status: Home / Self Care | Payer: Medicare Other | Attending: Internal Medicine | Admitting: Internal Medicine

## 2022-02-14 DIAGNOSIS — R0782 Intercostal pain: Secondary | ICD-10-CM | POA: Diagnosis not present

## 2022-02-14 DIAGNOSIS — S20419A Abrasion of unspecified back wall of thorax, initial encounter: Secondary | ICD-10-CM

## 2022-02-14 DIAGNOSIS — S20219A Contusion of unspecified front wall of thorax, initial encounter: Secondary | ICD-10-CM

## 2022-02-14 DIAGNOSIS — S90121A Contusion of right lesser toe(s) without damage to nail, initial encounter: Secondary | ICD-10-CM | POA: Diagnosis not present

## 2022-02-14 DIAGNOSIS — M79674 Pain in right toe(s): Secondary | ICD-10-CM

## 2022-02-14 MED ORDER — TRAMADOL HCL 50 MG PO TABS: 50.0000 mg | ORAL_TABLET | Freq: Four times a day (QID) | ORAL | 0 refills | Status: DC | PRN
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
## 2022-02-14 NOTE — Discharge Instructions (Signed)
Apply ice to the areas of pain for 20 minutes 3-4 times a day for 2 days You may continue the Ibuprofen since it is helping Follow up with your primary care next week.
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
## 2022-04-02 ENCOUNTER — Ambulatory Visit
Admission: RE | Admit: 2022-04-02 | Discharge: 2022-04-02 | Disposition: A | Discharge status: Home / Self Care | Payer: Medicare Other | Source: Ambulatory Visit | Attending: Acute Care | Admitting: Acute Care

## 2022-04-02 DIAGNOSIS — Z87891 Personal history of nicotine dependence: Secondary | ICD-10-CM

## 2022-04-02 DIAGNOSIS — F17200 Nicotine dependence, unspecified, uncomplicated: Secondary | ICD-10-CM

## 2022-05-04 ENCOUNTER — Other Ambulatory Visit: Payer: Self-pay | Admitting: Gerontology

## 2022-05-04 DIAGNOSIS — Z1231 Encounter for screening mammogram for malignant neoplasm of breast: Secondary | ICD-10-CM

## 2022-06-24 ENCOUNTER — Ambulatory Visit
Admission: RE | Admit: 2022-06-24 | Discharge: 2022-06-24 | Disposition: A | Discharge status: Home / Self Care | Payer: Medicare Other | Source: Ambulatory Visit | Attending: Gerontology | Admitting: Gerontology

## 2022-06-24 DIAGNOSIS — Z1231 Encounter for screening mammogram for malignant neoplasm of breast: Secondary | ICD-10-CM | POA: Insufficient documentation

## 2022-06-26 ENCOUNTER — Encounter: Payer: Self-pay | Admitting: Gerontology

## 2022-06-30 ENCOUNTER — Other Ambulatory Visit: Payer: Self-pay | Admitting: Gerontology

## 2022-06-30 DIAGNOSIS — N6489 Other specified disorders of breast: Secondary | ICD-10-CM

## 2022-06-30 DIAGNOSIS — R928 Other abnormal and inconclusive findings on diagnostic imaging of breast: Secondary | ICD-10-CM

## 2022-07-03 ENCOUNTER — Ambulatory Visit
Admission: RE | Admit: 2022-07-03 | Discharge: 2022-07-03 | Disposition: A | Discharge status: Home / Self Care | Payer: Medicare Other | Source: Ambulatory Visit | Attending: Gerontology | Admitting: Gerontology

## 2022-07-03 DIAGNOSIS — Z1239 Encounter for other screening for malignant neoplasm of breast: Secondary | ICD-10-CM | POA: Diagnosis not present

## 2022-07-03 DIAGNOSIS — N6489 Other specified disorders of breast: Secondary | ICD-10-CM

## 2022-07-03 DIAGNOSIS — R928 Other abnormal and inconclusive findings on diagnostic imaging of breast: Secondary | ICD-10-CM

## 2022-12-15 ENCOUNTER — Encounter: Payer: Self-pay | Admitting: Emergency Medicine

## 2022-12-15 ENCOUNTER — Ambulatory Visit: Admission: EM | Admit: 2022-12-15 | Discharge: 2022-12-15 | Payer: Medicare Other

## 2022-12-15 DIAGNOSIS — S91311A Laceration without foreign body, right foot, initial encounter: Secondary | ICD-10-CM

## 2022-12-15 NOTE — Discharge Instructions (Signed)
-  You need evaluation in the emergency department since you cannot really move your great toe and you have numbness.  I am concerned you sliced through a tendon and nerve.  Please go to [facility] at this time.  You have been advised to follow up immediately in the emergency department for concerning signs.symptoms. If you declined EMS transport, please have a family member take you directly to the ED at this time. Do not delay. Based on concerns about condition, if you do not follow up in th e ED, you may risk poor outcomes including worsening of condition, delayed treatment and potentially life threatening issues. If you have declined to go to the ED at this time, you should call your PCP immediately to set up a follow up appointment.  Go to ED for red flag symptoms, including; fevers you cannot reduce with Tylenol/Motrin, severe headaches, vision changes, numbness/weakness in part of the body, lethargy, confusion, intractable vomiting, severe dehydration, chest pain, breathing difficulty, severe persistent abdominal or pelvic pain, signs of severe infection (increased redness, swelling of an area), feeling faint or passing out, dizziness, etc. You should especially go to the ED for sudden acute worsening of condition if you do not elect to go at this time.
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
## 2022-12-15 NOTE — ED Notes (Signed)
Patient is being discharged from the Urgent Care and sent to the Emergency Department via personal vehicle . Per Athena Masse PA, patient is in need of higher level of care due to Foot Laceration. Patient is aware and verbalizes understanding of plan of care.  Vitals:   12/15/22 1408  BP: (!) 142/88  Pulse: 87  Resp: 18  Temp: 98.2 F (36.8 C)  SpO2: 100%
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
## 2023-06-16 ENCOUNTER — Other Ambulatory Visit: Payer: Self-pay | Admitting: Gerontology

## 2023-06-16 DIAGNOSIS — Z1231 Encounter for screening mammogram for malignant neoplasm of breast: Secondary | ICD-10-CM

## 2023-06-24 DIAGNOSIS — R7303 Prediabetes: Secondary | ICD-10-CM | POA: Diagnosis not present

## 2023-06-24 DIAGNOSIS — Z1331 Encounter for screening for depression: Secondary | ICD-10-CM | POA: Diagnosis not present

## 2023-06-24 DIAGNOSIS — E041 Nontoxic single thyroid nodule: Secondary | ICD-10-CM | POA: Diagnosis not present

## 2023-06-24 DIAGNOSIS — E785 Hyperlipidemia, unspecified: Secondary | ICD-10-CM | POA: Diagnosis not present

## 2023-06-24 DIAGNOSIS — Z0000 Encounter for general adult medical examination without abnormal findings: Secondary | ICD-10-CM | POA: Diagnosis not present

## 2023-06-24 DIAGNOSIS — J431 Panlobular emphysema: Secondary | ICD-10-CM | POA: Diagnosis not present

## 2023-06-24 DIAGNOSIS — M810 Age-related osteoporosis without current pathological fracture: Secondary | ICD-10-CM | POA: Diagnosis not present

## 2023-06-24 DIAGNOSIS — I700 Atherosclerosis of aorta: Secondary | ICD-10-CM | POA: Diagnosis not present

## 2023-06-24 DIAGNOSIS — I10 Essential (primary) hypertension: Secondary | ICD-10-CM | POA: Diagnosis not present

## 2023-06-24 DIAGNOSIS — E66812 Obesity, class 2: Secondary | ICD-10-CM | POA: Diagnosis not present

## 2023-06-24 DIAGNOSIS — K760 Fatty (change of) liver, not elsewhere classified: Secondary | ICD-10-CM | POA: Diagnosis not present

## 2023-06-25 DIAGNOSIS — I10 Essential (primary) hypertension: Secondary | ICD-10-CM | POA: Diagnosis not present

## 2023-06-25 DIAGNOSIS — G2581 Restless legs syndrome: Secondary | ICD-10-CM | POA: Diagnosis not present

## 2023-06-25 DIAGNOSIS — Z79899 Other long term (current) drug therapy: Secondary | ICD-10-CM | POA: Diagnosis not present

## 2023-06-25 DIAGNOSIS — M810 Age-related osteoporosis without current pathological fracture: Secondary | ICD-10-CM | POA: Diagnosis not present

## 2023-06-25 DIAGNOSIS — E559 Vitamin D deficiency, unspecified: Secondary | ICD-10-CM | POA: Diagnosis not present

## 2023-06-25 DIAGNOSIS — Z6836 Body mass index (BMI) 36.0-36.9, adult: Secondary | ICD-10-CM | POA: Diagnosis not present

## 2023-06-25 DIAGNOSIS — R7989 Other specified abnormal findings of blood chemistry: Secondary | ICD-10-CM | POA: Diagnosis not present

## 2023-06-25 DIAGNOSIS — E785 Hyperlipidemia, unspecified: Secondary | ICD-10-CM | POA: Diagnosis not present

## 2023-06-25 DIAGNOSIS — E66812 Obesity, class 2: Secondary | ICD-10-CM | POA: Diagnosis not present

## 2023-06-25 DIAGNOSIS — R7303 Prediabetes: Secondary | ICD-10-CM | POA: Diagnosis not present

## 2023-06-25 DIAGNOSIS — K760 Fatty (change of) liver, not elsewhere classified: Secondary | ICD-10-CM | POA: Diagnosis not present

## 2023-06-25 DIAGNOSIS — E041 Nontoxic single thyroid nodule: Secondary | ICD-10-CM | POA: Diagnosis not present

## 2023-06-29 ENCOUNTER — Ambulatory Visit
Admission: RE | Admit: 2023-06-29 | Discharge: 2023-06-29 | Disposition: A | Discharge status: Home / Self Care | Source: Ambulatory Visit | Attending: Gerontology | Admitting: Gerontology

## 2023-06-29 DIAGNOSIS — Z1231 Encounter for screening mammogram for malignant neoplasm of breast: Secondary | ICD-10-CM | POA: Diagnosis not present

## 2023-06-30 DIAGNOSIS — R911 Solitary pulmonary nodule: Secondary | ICD-10-CM | POA: Diagnosis not present

## 2023-06-30 DIAGNOSIS — K760 Fatty (change of) liver, not elsewhere classified: Secondary | ICD-10-CM | POA: Diagnosis not present

## 2023-06-30 DIAGNOSIS — C642 Malignant neoplasm of left kidney, except renal pelvis: Secondary | ICD-10-CM | POA: Diagnosis not present

## 2023-06-30 DIAGNOSIS — J431 Panlobular emphysema: Secondary | ICD-10-CM | POA: Diagnosis not present

## 2023-06-30 DIAGNOSIS — R918 Other nonspecific abnormal finding of lung field: Secondary | ICD-10-CM | POA: Diagnosis not present

## 2023-06-30 DIAGNOSIS — Z801 Family history of malignant neoplasm of trachea, bronchus and lung: Secondary | ICD-10-CM | POA: Diagnosis not present

## 2023-06-30 DIAGNOSIS — Z85528 Personal history of other malignant neoplasm of kidney: Secondary | ICD-10-CM | POA: Diagnosis not present

## 2023-06-30 DIAGNOSIS — Z08 Encounter for follow-up examination after completed treatment for malignant neoplasm: Secondary | ICD-10-CM | POA: Diagnosis not present

## 2023-09-07 DIAGNOSIS — L814 Other melanin hyperpigmentation: Secondary | ICD-10-CM | POA: Diagnosis not present

## 2023-09-07 DIAGNOSIS — Z85828 Personal history of other malignant neoplasm of skin: Secondary | ICD-10-CM | POA: Diagnosis not present

## 2023-09-07 DIAGNOSIS — Z7189 Other specified counseling: Secondary | ICD-10-CM | POA: Diagnosis not present

## 2023-09-07 DIAGNOSIS — L821 Other seborrheic keratosis: Secondary | ICD-10-CM | POA: Diagnosis not present

## 2023-09-07 DIAGNOSIS — L728 Other follicular cysts of the skin and subcutaneous tissue: Secondary | ICD-10-CM | POA: Diagnosis not present

## 2023-09-07 DIAGNOSIS — D1801 Hemangioma of skin and subcutaneous tissue: Secondary | ICD-10-CM | POA: Diagnosis not present

## 2023-09-07 DIAGNOSIS — I738 Other specified peripheral vascular diseases: Secondary | ICD-10-CM | POA: Diagnosis not present

## 2023-09-07 DIAGNOSIS — L570 Actinic keratosis: Secondary | ICD-10-CM | POA: Diagnosis not present

## 2023-09-07 DIAGNOSIS — Z08 Encounter for follow-up examination after completed treatment for malignant neoplasm: Secondary | ICD-10-CM | POA: Diagnosis not present

## 2023-09-26 IMAGING — US US ABDOMEN LIMITED
1 series · 14 of 23 positions shown · non-contrast
Comparison: 04/08/2021

CLINICAL DATA: cirrhosis/fatty liver

EXAM:
ULTRASOUND ABDOMEN LIMITED RIGHT UPPER QUADRANT

[Series 1: us abdomen limited · 0.23mm/px · 14 of 23 slices shown]
[im 1/23]
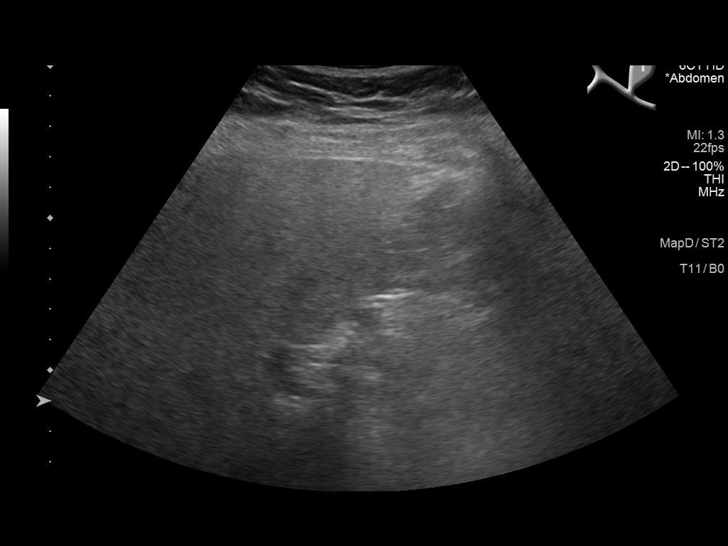
[im 3/23]
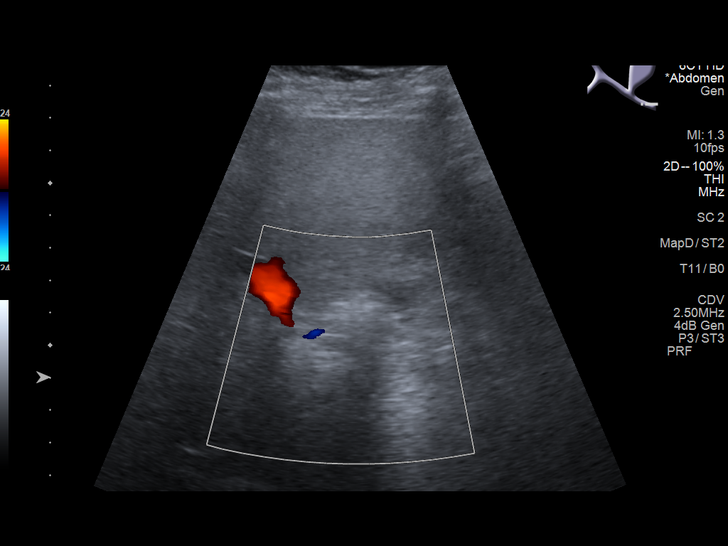
[im 5/23]
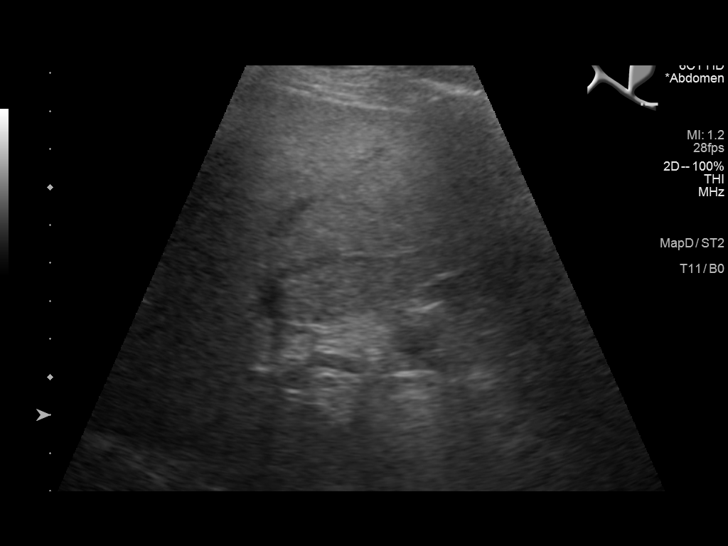
[im 6/23]
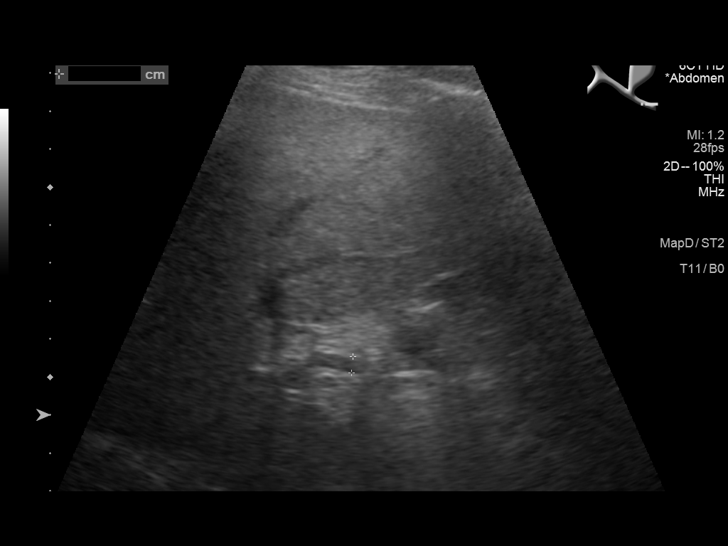
[im 8/23]
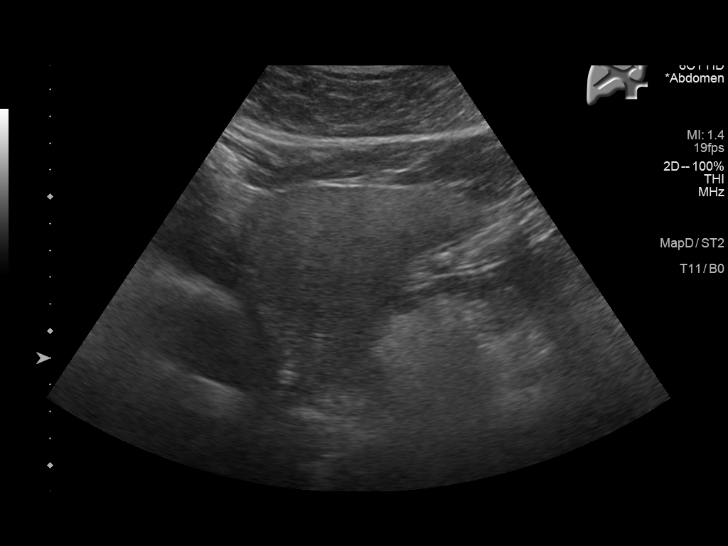
[im 10/23]
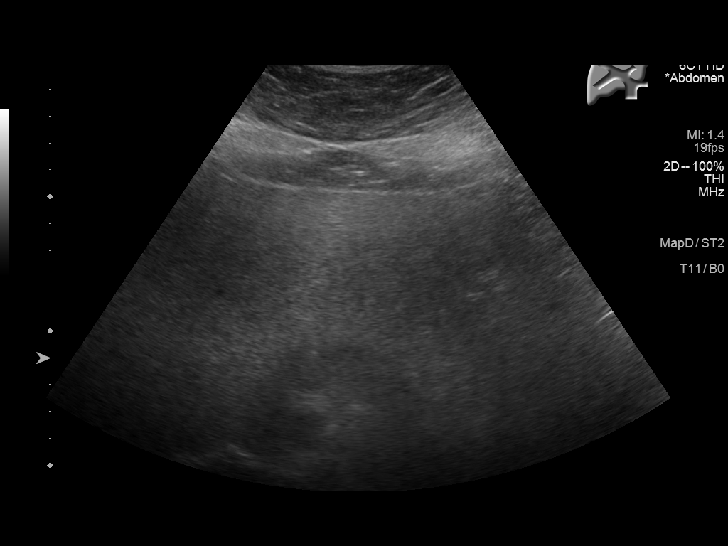
[im 11/23]
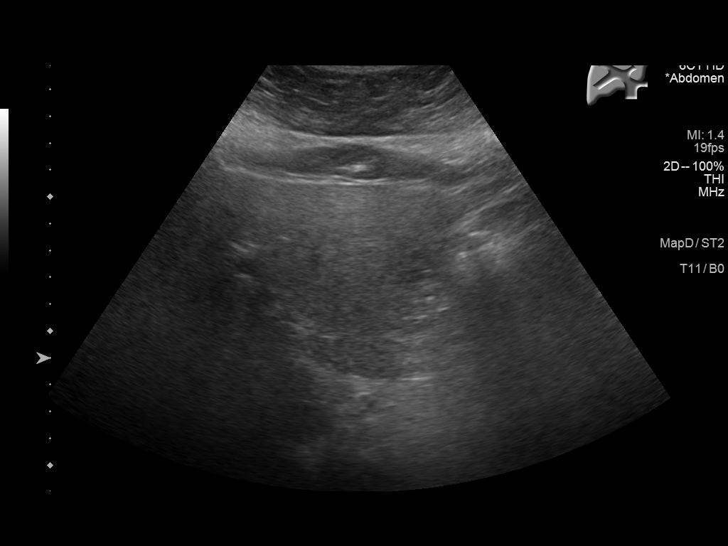
[im 13/23]
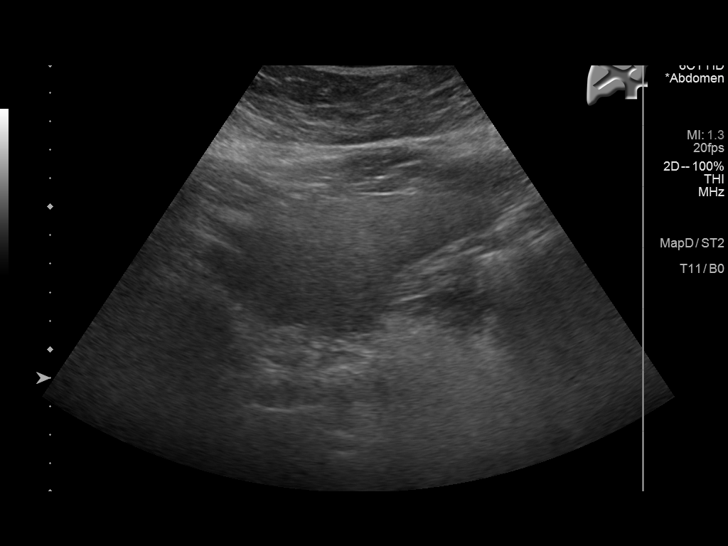
[im 14/23]
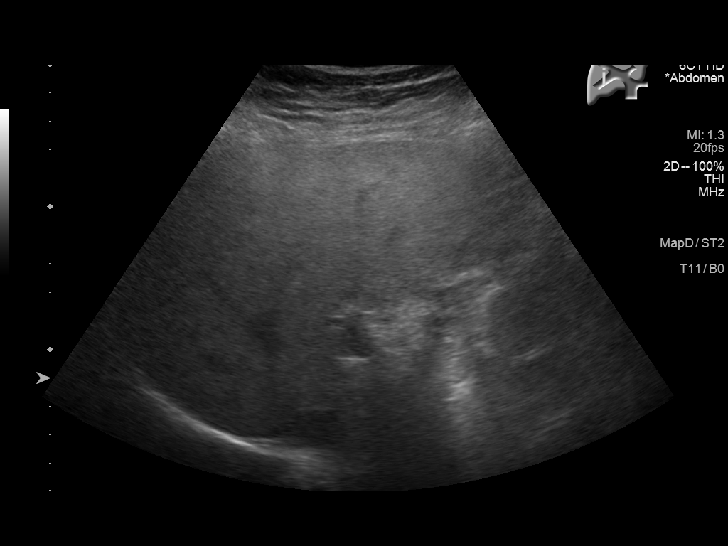
[im 16/23]
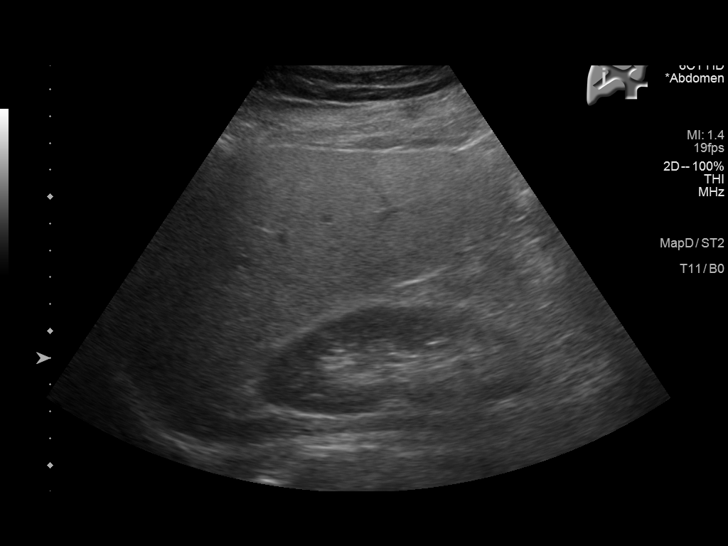
[im 18/23]
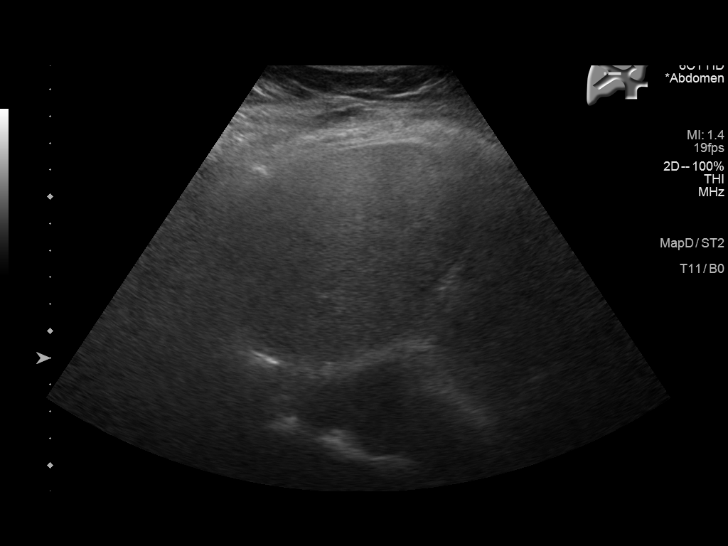
[im 19/23]
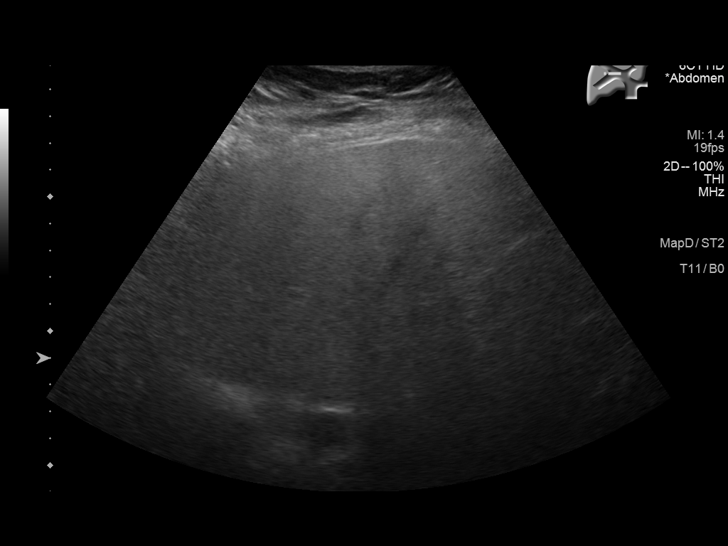
[im 21/23]
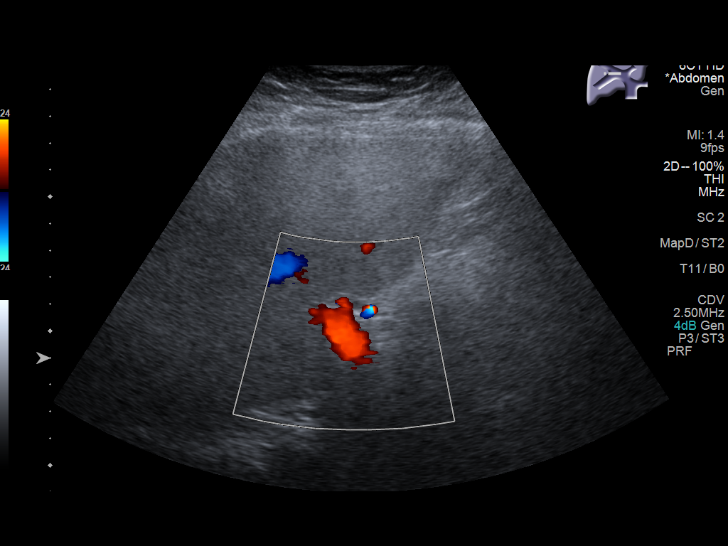
[im 23/23]
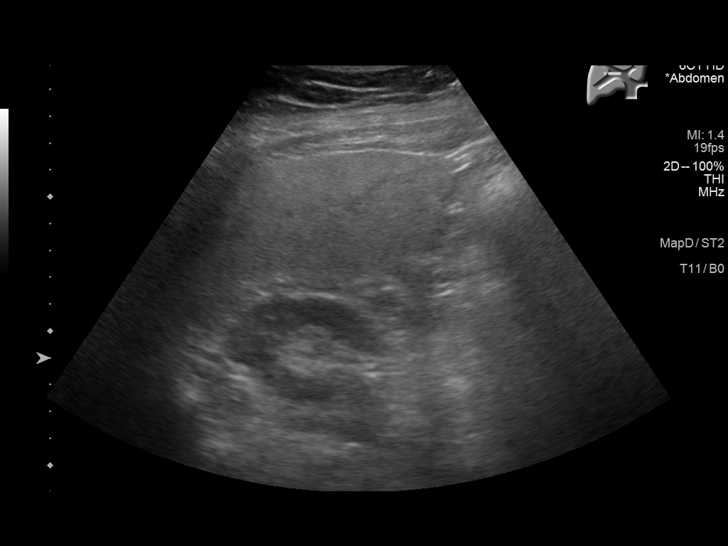

[14 of 23 positions shown; findings below may reference images not displayed]

FINDINGS: Gallbladder:

Interval cholecystectomy.

Common bile duct:

Diameter: 4 mm in proximal diameter

Liver:

Hepatic parenchymal echogenicity is diffusely increased and there is
poor acoustic through transmission in keeping with moderate hepatic
steatosis, similar to prior examination. No focal intrahepatic
masses are seen and there is no intrahepatic biliary ductal
dilation. portal vein is patent on color Doppler imaging with normal
direction of blood flow towards the liver.

Other: None.
IMPRESSION: Interval cholecystectomy.

No biliary ductal dilation identified.

Stable parenchymal changes most in keeping with at least moderate
hepatic steatosis.

## 2023-12-28 DIAGNOSIS — N952 Postmenopausal atrophic vaginitis: Secondary | ICD-10-CM | POA: Diagnosis not present

## 2023-12-28 DIAGNOSIS — R7989 Other specified abnormal findings of blood chemistry: Secondary | ICD-10-CM | POA: Diagnosis not present

## 2023-12-28 DIAGNOSIS — I10 Essential (primary) hypertension: Secondary | ICD-10-CM | POA: Diagnosis not present

## 2023-12-28 DIAGNOSIS — J431 Panlobular emphysema: Secondary | ICD-10-CM | POA: Diagnosis not present

## 2023-12-28 DIAGNOSIS — Z1331 Encounter for screening for depression: Secondary | ICD-10-CM | POA: Diagnosis not present

## 2023-12-28 DIAGNOSIS — Z09 Encounter for follow-up examination after completed treatment for conditions other than malignant neoplasm: Secondary | ICD-10-CM | POA: Diagnosis not present

## 2023-12-28 DIAGNOSIS — K219 Gastro-esophageal reflux disease without esophagitis: Secondary | ICD-10-CM | POA: Diagnosis not present

## 2023-12-28 DIAGNOSIS — E66812 Obesity, class 2: Secondary | ICD-10-CM | POA: Diagnosis not present

## 2023-12-28 DIAGNOSIS — E559 Vitamin D deficiency, unspecified: Secondary | ICD-10-CM | POA: Diagnosis not present

## 2024-04-02 ENCOUNTER — Encounter: Payer: Self-pay | Admitting: Emergency Medicine

## 2024-04-02 ENCOUNTER — Ambulatory Visit
Admission: EM | Admit: 2024-04-02 | Discharge: 2024-04-02 | Disposition: A | Discharge status: Home / Self Care | Attending: Emergency Medicine | Admitting: Emergency Medicine

## 2024-04-02 DIAGNOSIS — J09X2 Influenza due to identified novel influenza A virus with other respiratory manifestations: Secondary | ICD-10-CM | POA: Diagnosis present

## 2024-04-02 DIAGNOSIS — R0981 Nasal congestion: Secondary | ICD-10-CM | POA: Insufficient documentation

## 2024-04-02 DIAGNOSIS — R399 Unspecified symptoms and signs involving the genitourinary system: Secondary | ICD-10-CM | POA: Insufficient documentation

## 2024-04-02 DIAGNOSIS — N390 Urinary tract infection, site not specified: Secondary | ICD-10-CM | POA: Insufficient documentation

## 2024-04-02 LAB — POCT URINE DIPSTICK
Glucose, UA: NEGATIVE mg/dL
Nitrite, UA: NEGATIVE
POC PROTEIN,UA: >=300 — AB
Spec Grav, UA: >=1.03 — AB
Urobilinogen, UA: 0.2 U/dL
pH, UA: 6

## 2024-04-02 LAB — POCT INFLUENZA A/B
Influenza A, POC: POSITIVE — AB
Influenza B, POC: NEGATIVE

## 2024-04-02 MED ORDER — BENZONATATE 100 MG PO CAPS: 200.0000 mg | ORAL_CAPSULE | Freq: Three times a day (TID) | ORAL | 0 refills | Status: AC

## 2024-04-02 MED ORDER — NITROFURANTOIN MONOHYD MACRO 100 MG PO CAPS: 100.0000 mg | ORAL_CAPSULE | Freq: Two times a day (BID) | ORAL | 0 refills | Status: AC

## 2024-04-02 MED ORDER — PHENAZOPYRIDINE HCL 200 MG PO TABS: 200.0000 mg | ORAL_TABLET | Freq: Three times a day (TID) | ORAL | 0 refills | Status: AC

## 2024-04-02 MED ORDER — PROMETHAZINE-DM 6.25-15 MG/5ML PO SYRP: 5.0000 mL | ORAL_SOLUTION | Freq: Four times a day (QID) | ORAL | 0 refills | Status: AC | PRN

## 2024-04-02 MED ORDER — AEROCHAMBER MV MISC: 2 refills | Status: AC

## 2024-04-02 MED ORDER — OSELTAMIVIR PHOSPHATE 75 MG PO CAPS: 75.0000 mg | ORAL_CAPSULE | Freq: Two times a day (BID) | ORAL | 0 refills | Status: AC

## 2024-04-02 MED ORDER — ALBUTEROL SULFATE HFA 108 (90 BASE) MCG/ACT IN AERS: 2.0000 | INHALATION_SPRAY | RESPIRATORY_TRACT | 0 refills | Status: AC | PRN

## 2024-04-02 MED ORDER — IPRATROPIUM BROMIDE 0.06 % NA SOLN: 2.0000 | Freq: Four times a day (QID) | NASAL | 12 refills | Status: AC

## 2024-04-02 NOTE — Discharge Instructions (Addendum)
 Take the Macrobid  twice daily for 5 days with food for treatment of urinary tract infection.  Use the Pyridium  every 8 hours as needed for urinary discomfort.  This will turn your urine a bright red-orange.  Increase your oral fluid intake so that you increase your urine production and or flushing your urinary system.  Take an over-the-counter probiotic, such as Culturelle-Align-Activia, 1 hour after each dose of antibiotic to prevent diarrhea or yeast infections from forming.  We will culture urine and change the antibiotics if necessary.  Use over-the-counter Tylenol  and/or ibuprofen  as needed for any fever or pain.  Use the albuterol  inhaler, with a spacer, and take 1 to 2 puffs every 4-6 hours as needed for shortness breath or wheezing.  Take the Tamiflu  twice daily for 5 days for treatment of influenza.  Use the Atrovent  nasal spray, 2 squirts up each nostril every 6 hours, as needed for nasal congestion and runny nose.  Use over-the-counter Delsym, Zarbee's, or Robitussin during the day as needed for cough.  Use the Tessalon  Perles every 8 hours as needed for cough.  Taken with a small sip of water.  You may experience some numbness to your tongue or metallic taste in your mouth, this is normal.  Use the Promethazine  DM cough syrup at bedtime as will make you drowsy but it should help dry up your postnasal drip and aid you in sleep and cough relief.  Return for reevaluation, or see your primary care provider, for new or worsening symptoms.  If you have a return of confusion, fevers have not responded to Tylenol  or ibuprofen , or shortness of breath that does not respond to your albuterol  inhaler please go to the ER for evaluation.

## 2024-04-02 NOTE — ED Triage Notes (Signed)
 Pt c/o urinary frequency, and dysuria. Started about 2-3 days ago. Pt also having some confusion.  Pt also c/o cough, wheezing, runny nose. Started 2-3 days ago. Denies fever.
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
## 2024-04-04 ENCOUNTER — Ambulatory Visit (HOSPITAL_COMMUNITY): Payer: Self-pay

## 2024-04-04 LAB — URINE CULTURE
Culture: >=100000 — AB
Special Requests: NORMAL

## 2024-04-13 ENCOUNTER — Ambulatory Visit
Admission: EM | Admit: 2024-04-13 | Discharge: 2024-04-13 | Disposition: A | Discharge status: Home / Self Care | Attending: Family Medicine | Admitting: Family Medicine

## 2024-04-13 DIAGNOSIS — N3000 Acute cystitis without hematuria: Secondary | ICD-10-CM | POA: Insufficient documentation

## 2024-04-13 DIAGNOSIS — R300 Dysuria: Secondary | ICD-10-CM | POA: Insufficient documentation

## 2024-04-13 DIAGNOSIS — J441 Chronic obstructive pulmonary disease with (acute) exacerbation: Secondary | ICD-10-CM | POA: Insufficient documentation

## 2024-04-13 LAB — POCT URINE DIPSTICK
Bilirubin, UA: NEGATIVE
Blood, UA: NEGATIVE
Glucose, UA: NEGATIVE mg/dL
Ketones, POC UA: NEGATIVE mg/dL
Nitrite, UA: NEGATIVE
Protein Ur, POC: NEGATIVE mg/dL
Spec Grav, UA: 1.01
Urobilinogen, UA: 0.2 U/dL
pH, UA: 7

## 2024-04-13 MED ORDER — PREDNISONE 20 MG PO TABS: 40.0000 mg | ORAL_TABLET | Freq: Every day | ORAL | 0 refills | Status: AC

## 2024-04-13 MED ORDER — LEVOFLOXACIN 500 MG PO TABS: 500.0000 mg | ORAL_TABLET | Freq: Every day | ORAL | 0 refills | Status: AC

## 2024-04-13 NOTE — Discharge Instructions (Signed)
 The clinic will contact you with results of the urine culture done today if positive.  Start Levaquin  antibiotic once a day for 5 days to cover both your UTI as well as your COPD symptoms.  Start prednisone  daily for 5 days as well for your COPD symptoms.  Continue your inhalers as prescribed.  Lots of rest and fluids.  Please follow-up with your PCP in 2 days for recheck.  Please go to the ER if you develop any worsening symptoms.  Hope you feel better soon!

## 2024-04-13 NOTE — ED Triage Notes (Signed)
 Pt present cough with congestion, symptoms started a week ago. Pt recently tested positive for Flu A, and states symptoms been the same. Pt here for UTI also, pt sister states going to bathroom more frequently then normal.

## 2024-04-13 NOTE — ED Provider Notes (Signed)
 " [facility]    CSN: [id number] Arrival date & time: 04/13/24  1348      History   Chief Complaint Chief Complaint  Patient presents with   Cough   Urinary Frequency    HPI Amber Perez is a 79 y.o. female presents for dysuria and cough.  Patient is brought in by sister who helps to augment history.  Patient was seen in urgent care on 12/28 and diagnosed with influenza A as well as a urinary tract infection.  She was started on Tamiflu , breast-feeding DM, benzonatate , and Macrobid .  She reports her urinary symptoms resolved but returned a couple of days ago.  She also reports a persistent productive cough with shortness of breath.  No fevers, nausea/vomiting, flank pain, sore throat.  Does have a history of COPD and has been using her inhalers as prescribed.  She has been doing cough drops OTC for symptoms.  No other concerns at this time   Cough Associated symptoms: shortness of breath   Urinary Frequency Associated symptoms include shortness of breath.    Past Medical History:  Diagnosis Date   Cancer of left kidney (HCC)    Closed left arm fracture    [facility]    COPD (chronic obstructive pulmonary disease) (HCC)    Dyslipidemia    GERD (gastroesophageal reflux disease)    Hypertension 2008   Obesity    Osteoarthritis    Osteoporosis    Pneumonia 2008   Pre-diabetes    Thyroid  nodule    Vitamin D  deficiency     Patient Active Problem List   Diagnosis Date Noted   Status post laparoscopic cholecystectomy 05/13/2021   Urinary frequency 02/26/2020   Dysuria 02/26/2020   Class 2 severe obesity due to excess calories with serious comorbidity and body mass index (BMI) of 36.0 to 36.9 in adult 11/29/2019   Chronic pain of left knee 11/29/2019   Abdominal discomfort 11/29/2019   Low vitamin B12 level 08/16/2019   RLS (restless legs syndrome) 08/16/2019   Vitamin D  deficiency 08/16/2019   Sinusitis 06/06/2019   Hip pain 04/05/2019    Overactive bladder 04/05/2019   Cancer of kidney (HCC) 04/05/2019   Tingling 04/05/2019   Lightheadedness 04/05/2019   Breast pain 07/20/2018   Dyspnea on exertion 07/14/2018   Encounter for general adult medical examination with abnormal findings 08/04/2017   Rib pain 06/23/2017   Right knee pain 12/14/2016   S/P total knee arthroplasty 06/29/2016   Essential hypertension 01/25/2014   Obesity (BMI 30-39.9) 01/25/2014   Atrophic vaginitis 03/24/2012   Thyroid  nodule 11/25/2011   Osteopenia 11/25/2011   Hyperlipidemia 11/25/2011   GERD (gastroesophageal reflux disease) 03/19/2011   Allergic rhinitis 03/19/2011    Past Surgical History:  Procedure Laterality Date   BREAST BIOPSY Left    neg   BREAST SURGERY Left    Benign per pt's report   CATARACT EXTRACTION  2009   CATARACT EXTRACTION     CESAREAN SECTION  8032,8021   CHOLECYSTECTOMY     COLONOSCOPY  2007   COLONOSCOPY WITH PROPOFOL  N/A 08/18/2019   Procedure: COLONOSCOPY WITH PROPOFOL ;  Surgeon: Dessa Reyes ORN, MD;  Location: [facility];  Service: Endoscopy;  Laterality: N/A;   ESOPHAGOGASTRODUODENOSCOPY  03/13/2009   GANGLION CYST EXCISION Left    KNEE ARTHROPLASTY Right 06/29/2016   Procedure: COMPUTER ASSISTED TOTAL KNEE ARTHROPLASTY;  Surgeon: Lynwood SHAUNNA Hue, MD;  Location: [facility];  Service: Orthopedics;  Laterality: Right;   KNEE ARTHROSCOPY  W/ MENISCAL REPAIR Right 04/2012   Dr. Mardee   ROBOTIC ASSITED PARTIAL NEPHRECTOMY Left 06/12/2019   TOTAL ABDOMINAL HYSTERECTOMY W/ BILATERAL SALPINGOOPHORECTOMY  1995   UTERINE FIBROID SURGERY     x 5    OB History   No obstetric history on file.      Home Medications    Prior to Admission medications  Medication Sig Start Date End Date Taking? Authorizing Provider  levofloxacin  (LEVAQUIN ) 500 MG tablet Take 1 tablet (500 mg total) by mouth daily for 5 days. 04/13/24 04/18/24 Yes Natacia Chaisson, Jodi R, NP  predniSONE  (DELTASONE ) 20 MG tablet Take 2 tablets (40 mg  total) by mouth daily with breakfast for 5 days. 04/13/24 04/18/24 Yes Verginia Toohey, Jodi R, NP  albuterol  (VENTOLIN  HFA) 108 (90 Base) MCG/ACT inhaler Inhale 2 puffs into the lungs every 4 (four) hours as needed. 04/02/24   Bernardino Ditch, NP  amLODipine  (NORVASC ) 5 MG tablet TAKE 1 TABLET(5 MG) BY MOUTH DAILY 07/21/21   Maribeth Camellia MATSU, MD  benzonatate  (TESSALON ) 100 MG capsule Take 2 capsules (200 mg total) by mouth every 8 (eight) hours. 04/02/24   Bernardino Ditch, NP  Calcium -Magnesium  (CAL-MAG PO) Take 2 tablets by mouth at bedtime. Takes occasionally    [provider]  Cholecalciferol  (VITAMIN D3 PO) Take 2,500 Units by mouth at bedtime.    [provider]  conjugated estrogens  (PREMARIN ) vaginal cream PLACE 1 APPLICATORFUL VAGINALLY 2 TIMES A WEEK 02/25/21   Maribeth Camellia MATSU, MD  CRANBERRY PO Take 2 tablets by mouth daily. With D-Mannose    [provider]  guaiFENesin (MUCINEX) 600 MG 12 hr tablet Take 600 mg by mouth 2 (two) times daily as needed for to loosen phlegm or cough.    [provider]  ibuprofen  (ADVIL ) 800 MG tablet Take 1 tablet (800 mg total) by mouth every 8 (eight) hours as needed. 04/28/21   Lane Shope, MD  ipratropium (ATROVENT ) 0.06 % nasal spray Place 2 sprays into both nostrils 4 (four) times daily. 04/02/24   Bernardino Ditch, NP  Misc Natural Products (LIVER PROTECT PO) Take 1 capsule by mouth daily.    [provider]  nitrofurantoin , macrocrystal-monohydrate, (MACROBID ) 100 MG capsule Take 1 capsule (100 mg total) by mouth 2 (two) times daily. 04/02/24   Bernardino Ditch, NP  oseltamivir  (TAMIFLU ) 75 MG capsule Take 1 capsule (75 mg total) by mouth every 12 (twelve) hours. 04/02/24   Bernardino Ditch, NP  OVER THE COUNTER MEDICATION Take 1 tablet by mouth daily. Vit K2 and MK7    [provider]  pantoprazole  (PROTONIX ) 40 MG tablet TAKE 1 TABLET(40 MG) BY MOUTH DAILY 01/30/21   Maribeth Camellia MATSU, MD  phenazopyridine  (PYRIDIUM )  200 MG tablet Take 1 tablet (200 mg total) by mouth 3 (three) times daily. 04/02/24   Bernardino Ditch, NP  promethazine -dextromethorphan (PROMETHAZINE -DM) 6.25-15 MG/5ML syrup Take 5 mLs by mouth 4 (four) times daily as needed. 04/02/24   Bernardino Ditch, NP  pseudoephedrine (SUDAFED) 30 MG tablet Take 30 mg by mouth every 4 (four) hours as needed for congestion.    [provider]  Spacer/Aero-Holding Chambers (AEROCHAMBER MV) inhaler Use as instructed 04/02/24   Bernardino Ditch, NP  triamcinolone  (NASACORT ) 55 MCG/ACT AERO nasal inhaler Place 2 sprays into the nose daily as needed (allergies).    [provider]  vitamin E 180 MG (400 UNITS) capsule Take 400 Units by mouth daily.    [provider]    Family History Family  History  Problem Relation Age of Onset   Anemia Mother        aplastic   Aplastic anemia Mother    Throat cancer Father    Hypertension Father    Alcohol  abuse Father    COPD Father    Diabetes Sister    Esophageal cancer Brother    Colon polyps Sister    Diabetes Brother    Multiple myeloma Brother    Diabetes Brother    Colon cancer Maternal Aunt    Breast cancer Neg Hx     Social History Social History[1]   Allergies   Penicillins and Bisphosphonates   Review of Systems Review of Systems  HENT:  Positive for congestion.   Respiratory:  Positive for cough and shortness of breath.   Genitourinary:  Positive for dysuria and frequency.     Physical Exam Triage Vital Signs ED Triage Vitals  Encounter Vitals Group     BP 04/13/24 1423 137/80     Girls Systolic BP Percentile --      Girls Diastolic BP Percentile --      Boys Systolic BP Percentile --      Boys Diastolic BP Percentile --      Pulse Rate 04/13/24 1423 (!) 103     Resp 04/13/24 1423 16     Temp 04/13/24 1423 97.6 F (36.4 C)     Temp Source 04/13/24 1423 Oral     SpO2 04/13/24 1423 95 %     Weight 04/13/24 1424 175 lb 0.7 oz (79.4 kg)     Height --      Head  Circumference --      Peak Flow --      Pain Score 04/13/24 1424 0     Pain Loc --      Pain Education --      Exclude from Growth Chart --    No data found.  Updated Vital Signs BP 137/80 (BP Location: Right Arm)   Pulse (!) 103   Temp 97.6 F (36.4 C) (Oral)   Resp 16   Wt 175 lb 0.7 oz (79.4 kg)   SpO2 95%   BMI 35.35 kg/m   Visual Acuity Right Eye Distance:   Left Eye Distance:   Bilateral Distance:    Right Eye Near:   Left Eye Near:    Bilateral Near:     Physical Exam Vitals and nursing note reviewed.  Constitutional:      General: She is not in acute distress.    Appearance: She is well-developed. She is not ill-appearing.  HENT:     Head: Normocephalic and atraumatic.     Right Ear: Tympanic membrane and ear canal normal.     Left Ear: Tympanic membrane and ear canal normal.     Nose: Congestion present.     Mouth/Throat:     Mouth: Mucous membranes are moist.     Pharynx: Oropharynx is clear. Uvula midline. No oropharyngeal exudate or posterior oropharyngeal erythema.     Tonsils: No tonsillar exudate or tonsillar abscesses.  Eyes:     Conjunctiva/sclera: Conjunctivae normal.     Pupils: Pupils are equal, round, and reactive to light.  Cardiovascular:     Rate and Rhythm: Normal rate and regular rhythm.     Heart sounds: Normal heart sounds.  Pulmonary:     Effort: Pulmonary effort is normal.     Breath sounds: Normal breath sounds. No wheezing, rhonchi or rales.  Abdominal:  Tenderness: There is no right CVA tenderness or left CVA tenderness.  Musculoskeletal:     Cervical back: Normal range of motion and neck supple.  Lymphadenopathy:     Cervical: No cervical adenopathy.  Skin:    General: Skin is warm and dry.  Neurological:     General: No focal deficit present.     Mental Status: She is alert and oriented to person, place, and time.  Psychiatric:        Mood and Affect: Mood normal.        Behavior: Behavior normal.      UC  Treatments / Results  Labs (all labs ordered are listed, but only abnormal results are displayed) Labs Reviewed  POCT URINE DIPSTICK - Abnormal; Notable for the following components:      Result Value   Leukocytes, UA Small (1+) (*)    All other components within normal limits  URINE CULTURE   Comprehensive Metabolic Panel (CMP) Order: [id number] Component Ref Range & Units 9 mo ago  Glucose 70 - 110 mg/dL 891  Sodium 863 - 854 mmol/L 141  Potassium 3.6 - 5.1 mmol/L 3.9  Chloride 97 - 109 mmol/L 105  Carbon Dioxide (CO2) 22.0 - 32.0 mmol/L 27.7  Urea Nitrogen (BUN) 7 - 25 mg/dL 17  Creatinine 0.6 - 1.1 mg/dL 0.7  Glomerular Filtration Rate (eGFR) >60 mL/min/1.73sq m 90  Comment: CKD-EPI (2021) does not include patient's race in the calculation of eGFR.  Monitoring changes of plasma creatinine and eGFR over time is useful for monitoring kidney function.  Interpretive Ranges for eGFR (CKD-EPI 2021):  eGFR:       >60 mL/min/1.73 sq. m - Normal eGFR:       30-59 mL/min/1.73 sq. m - Moderately Decreased eGFR:       15-29 mL/min/1.73 sq. m  - Severely Decreased eGFR:       < 15 mL/min/1.73 sq. m  - Kidney Failure   Note: These eGFR calculations do not apply in acute situations when eGFR is changing rapidly or patients on dialysis.  Calcium  8.7 - 10.3 mg/dL 9.5  AST 8 - 39 U/L 16  ALT 5 - 38 U/L 23  Alk Phos (alkaline Phosphatase) 34 - 104 U/L 64  Albumin 3.5 - 4.8 g/dL 4.2  Bilirubin, Total 0.3 - 1.2 mg/dL 0.6  Protein, Total 6.1 - 7.9 g/dL 6.9  A/G Ratio 1.0 - 5.0 gm/dL 1.6  Resulting Agency [facility]   Specimen Collected: 06/25/23 10:45   Performed by: [facility] Last Resulted: 06/25/23 16:42  Received From: [facility]  Result Received: 06/29/23 13:56   EKG   Radiology No results found.  Procedures Procedures (including critical care time)  Medications Ordered in UC Medications - No data to  display  Initial Impression / Assessment and Plan / UC Course  I have reviewed the triage vital signs and the nursing notes.  Pertinent labs & imaging results that were available during my care of the patient were reviewed by me and considered in my medical decision making (see chart for details).   Reviewed exam and symptoms with patient and sister.  Urine shows +1 leuks, will send for urine culture.  Will treat for her COPD exacerbation as well as her UTI with Levaquin  given patient allergies to penicillin.  She does not know if she has been on cephalosporins in the past.  Side effect profile was reviewed.  Will also do prednisone   daily for 5 days for her COPD symptoms.  She is to continue her inhalers as prescribed.  Lots of rest and fluids and PCP follow-up 2 days for recheck.  Strict ER precautions reviewed and patient and sister verbalized understanding. Final Clinical Impressions(s) / UC Diagnoses   Final diagnoses:  Dysuria  Acute cystitis without hematuria  COPD exacerbation [facility])     Discharge Instructions      The clinic will contact you with results of the urine culture done today if positive.  Start Levaquin  antibiotic once a day for 5 days to cover both your UTI as well as your COPD symptoms.  Start prednisone  daily for 5 days as well for your COPD symptoms.  Continue your inhalers as prescribed.  Lots of rest and fluids.  Please follow-up with your PCP in 2 days for recheck.  Please go to the ER if you develop any worsening symptoms.  Hope you feel better soon!     ED Prescriptions     Medication Sig Dispense Auth. Provider   levofloxacin  (LEVAQUIN ) 500 MG tablet Take 1 tablet (500 mg total) by mouth daily for 5 days. 5 tablet Anjeanette Petzold, Jodi R, NP   predniSONE  (DELTASONE ) 20 MG tablet Take 2 tablets (40 mg total) by mouth daily with breakfast for 5 days. 10 tablet Yorel Redder, Jodi R, NP      PDMP not reviewed this encounter.     [1]  Social History Tobacco Use    Smoking status: Former    Current packs/day: 0.00    Average packs/day: 2.0 packs/day for 18.5 years (37.0 ttl pk-yrs)    Types: Cigarettes    Start date: 09/17/1987    Quit date: 03/18/2006    Years since quitting: 18.0   Smokeless tobacco: Never  Vaping Use   Vaping status: Never Used  Substance Use Topics   Alcohol  use: Yes    Alcohol /week: 0.0 - 2.0 standard drinks of alcohol     Comment: Occasional glass of wine, sometimes 2 in a week, sometimes 1 every couple of months.   Drug use: No     Loreda Myla SAUNDERS, NP 04/13/24 1504  "

## 2024-04-16 LAB — URINE CULTURE: Culture: >=100000 — AB

## 2024-04-17 ENCOUNTER — Ambulatory Visit (HOSPITAL_COMMUNITY): Payer: Self-pay

## 2024-04-24 ENCOUNTER — Other Ambulatory Visit: Payer: Self-pay | Admitting: Gerontology

## 2024-04-24 DIAGNOSIS — R413 Other amnesia: Secondary | ICD-10-CM

## 2024-04-26 ENCOUNTER — Other Ambulatory Visit: Payer: Self-pay | Admitting: Gerontology

## 2024-04-26 DIAGNOSIS — R413 Other amnesia: Secondary | ICD-10-CM

## 2024-04-27 ENCOUNTER — Encounter: Payer: Self-pay | Admitting: Gerontology

## 2024-05-17 ENCOUNTER — Other Ambulatory Visit
